# Patient Record
Sex: Female | Born: 1950 | Race: White | Hispanic: No | Smoking: Never smoker
Health system: Southern US, Community
[De-identification: ages and names within clinical notes are randomized; demographics above are authoritative.]

## PROBLEM LIST (undated history)

## (undated) DIAGNOSIS — Z23 Encounter for immunization: Secondary | ICD-10-CM

## (undated) DIAGNOSIS — M1990 Unspecified osteoarthritis, unspecified site: Secondary | ICD-10-CM

## (undated) DIAGNOSIS — R947 Abnormal results of other endocrine function studies: Secondary | ICD-10-CM

## (undated) DIAGNOSIS — Z789 Other specified health status: Secondary | ICD-10-CM

## (undated) DIAGNOSIS — Z973 Presence of spectacles and contact lenses: Secondary | ICD-10-CM

## (undated) DIAGNOSIS — Z1159 Encounter for screening for other viral diseases: Secondary | ICD-10-CM

## (undated) DIAGNOSIS — R3121 Asymptomatic microscopic hematuria: Secondary | ICD-10-CM

## (undated) DIAGNOSIS — E039 Hypothyroidism, unspecified: Secondary | ICD-10-CM

## (undated) DIAGNOSIS — K5909 Other constipation: Secondary | ICD-10-CM

## (undated) DIAGNOSIS — Z9889 Other specified postprocedural states: Secondary | ICD-10-CM

## (undated) DIAGNOSIS — D0511 Intraductal carcinoma in situ of right breast: Secondary | ICD-10-CM

## (undated) DIAGNOSIS — N814 Uterovaginal prolapse, unspecified: Secondary | ICD-10-CM

## (undated) DIAGNOSIS — I10 Essential (primary) hypertension: Secondary | ICD-10-CM

## (undated) HISTORY — DX: Essential (primary) hypertension: I10

## (undated) HISTORY — DX: Intraductal carcinoma in situ of right breast: D05.11

## (undated) HISTORY — PX: MOUTH SURGERY: SHX715

## (undated) HISTORY — PX: EYE SURGERY: SHX253

## (undated) HISTORY — PX: BREAST BIOPSY: SHX20

---

## 1998-10-01 ENCOUNTER — Ambulatory Visit (HOSPITAL_COMMUNITY): Admission: RE | Admit: 1998-10-01 | Discharge: 1998-10-01 | Payer: Self-pay | Admitting: Gastroenterology

## 2000-12-18 HISTORY — PX: LUMBAR LAMINECTOMY: SHX95

## 2001-07-26 ENCOUNTER — Encounter: Admission: RE | Admit: 2001-07-26 | Discharge: 2001-07-26 | Payer: Self-pay | Admitting: Family Medicine

## 2001-08-07 ENCOUNTER — Encounter: Admission: RE | Admit: 2001-08-07 | Discharge: 2001-08-07 | Payer: Self-pay | Admitting: Family Medicine

## 2001-08-28 ENCOUNTER — Ambulatory Visit (HOSPITAL_COMMUNITY): Admission: RE | Admit: 2001-08-28 | Discharge: 2001-08-29 | Payer: Self-pay | Admitting: Neurosurgery

## 2001-09-27 ENCOUNTER — Ambulatory Visit (HOSPITAL_COMMUNITY): Admission: RE | Admit: 2001-09-27 | Discharge: 2001-09-27 | Payer: Self-pay | Admitting: Neurosurgery

## 2005-05-31 ENCOUNTER — Encounter: Admission: RE | Admit: 2005-05-31 | Discharge: 2005-05-31 | Payer: Self-pay | Admitting: General Surgery

## 2005-06-01 ENCOUNTER — Encounter (INDEPENDENT_AMBULATORY_CARE_PROVIDER_SITE_OTHER): Payer: Self-pay | Admitting: Specialist

## 2005-06-01 ENCOUNTER — Encounter: Admission: RE | Admit: 2005-06-01 | Discharge: 2005-06-01 | Payer: Self-pay | Admitting: General Surgery

## 2005-12-01 ENCOUNTER — Encounter: Admission: RE | Admit: 2005-12-01 | Discharge: 2005-12-01 | Payer: Self-pay | Admitting: Internal Medicine

## 2006-04-11 ENCOUNTER — Other Ambulatory Visit: Admission: RE | Admit: 2006-04-11 | Discharge: 2006-04-11 | Payer: Self-pay | Admitting: Internal Medicine

## 2006-06-01 ENCOUNTER — Encounter: Admission: RE | Admit: 2006-06-01 | Discharge: 2006-06-01 | Payer: Self-pay | Admitting: Internal Medicine

## 2007-06-03 ENCOUNTER — Encounter: Admission: RE | Admit: 2007-06-03 | Discharge: 2007-06-03 | Payer: Self-pay | Admitting: Internal Medicine

## 2007-12-28 ENCOUNTER — Emergency Department (HOSPITAL_COMMUNITY): Admission: EM | Admit: 2007-12-28 | Discharge: 2007-12-28 | Payer: Self-pay | Admitting: Family Medicine

## 2008-06-09 ENCOUNTER — Encounter: Admission: RE | Admit: 2008-06-09 | Discharge: 2008-06-09 | Payer: Self-pay | Admitting: Internal Medicine

## 2009-04-15 ENCOUNTER — Other Ambulatory Visit: Admission: RE | Admit: 2009-04-15 | Discharge: 2009-04-15 | Payer: Self-pay | Admitting: Internal Medicine

## 2009-05-07 ENCOUNTER — Encounter: Admission: RE | Admit: 2009-05-07 | Discharge: 2009-05-07 | Payer: Self-pay | Admitting: Internal Medicine

## 2009-06-10 ENCOUNTER — Encounter: Admission: RE | Admit: 2009-06-10 | Discharge: 2009-06-10 | Payer: Self-pay | Admitting: Internal Medicine

## 2009-12-18 DIAGNOSIS — Z23 Encounter for immunization: Secondary | ICD-10-CM

## 2009-12-18 HISTORY — DX: Encounter for immunization: Z23

## 2010-06-13 ENCOUNTER — Encounter: Admission: RE | Admit: 2010-06-13 | Discharge: 2010-06-13 | Payer: Self-pay | Admitting: Adult Health

## 2010-06-14 ENCOUNTER — Encounter: Admission: RE | Admit: 2010-06-14 | Discharge: 2010-06-14 | Payer: Self-pay | Admitting: Internal Medicine

## 2010-12-18 DIAGNOSIS — Z9889 Other specified postprocedural states: Secondary | ICD-10-CM

## 2010-12-18 HISTORY — DX: Other specified postprocedural states: Z98.890

## 2011-05-05 NOTE — Op Note (Signed)
[place]  Patient:    LAYLONIE, MARZEC Visit Number: [id number] MRN: [id number]          Service Type: DSU Location: 3000 3039 01 Attending Physician:  Cristi Loron Dictated by:   Cristi Loron, M.D. Proc. Date: 08/28/01 Admit Date:  08/28/2001                             Operative Report  PREOPERATIVE DIAGNOSIS:  Right L4 herniated nucleus pulposis, spinal stenosis, lumbar radiculopathy of the lumbago.  POSTOPERATIVE DIAGNOSIS: Right L4 herniated nucleus pulposis, spinal stenosis, lumbar radiculopathy of the lumbago.  OPERATION:  Right L4-5 microdiskectomy using microdissection.  SURGEON:  Cristi Loron, M.D.  ASSISTANT:  Hewitt Shorts, M.D.  ANESTHESIA:  General endotracheal.  ESTIMATED BLOOD LOSS:  250 cc.  SPECIMENS:  None.  DRAINS:  None.  COMPLICATIONS:  None.  INDICATIONS:  The patient is a 60 year old white female who has suffered from back and right leg pain.  She has failed medical management.  She was worked up as an outpatient with a lumbar MRI which demonstrated a herniated disk at L4 on the right.  The patients signs and symptoms of physical exam are consistent with a right L4-5 radiculopathy.  The patient therefore weighed the risks, benefits and alternatives of surgery and decided to proceed with a microdiskectomy.  DESCRIPTION OF PROCEDURE:  The patient was brought to the operating room by the anesthesia team. General endotracheal anesthesia was induced.  The patient was turned to the prone position on the Wilson frame.  Her lumbosacral region was then prepared with Betadine scrub and Betadine solution.  Sterile drapes were applied.  I then injected the area to be incised with Marcaine with epinephrine solution.  I used a scalpel to make a midline incision over the L3-L4 and L4-L5 interspaces.  I used electrocautery to dissect down to the thoracolumbar fascia and divided the fascia just to  the right of midline performing a right sided subperiosteal dissection stripping the paraspinous musculature from the right spinous process and lamina of L3, L4 and L5.  I inserted the McCullough retractor for exposure, and then I obtained intraoperative radiographs to confirm my location.  I then brought the microscope into the field and under its magnification and illumination I completed the microdissection/decompression.  I used the high speed drill to perform a right L4 and L3 laminotomy.  I removed the right L4-L5 ligament of flavum and I completed a right L4 hemilaminectomy using Kerrison punch.  I then the ligament of L3-L4 and caudal aspect of the right L3 lamina.  I removed some ligament from the lateral recess, and this gave good exposure of the thecal sac as well as the right L4-L5 nerve root.  I used microdissection to free up the thecal sac and the nerve root from the epidural tissue and then used the DErrico retractor to carefully tract the thecal sac medially. The patient had a large free fragment disk herniation which was just ventral to the right L4 nerve root as it was exiting around the right L4 pedicle out the neural foramen.  I used microdissection to free up the herniated disk and removed it in multiple fragments using pituitary forceps.  I resounded the neural foramen with the nerve hook and coronary dilator and got some more fragments out the of neural foramen.  In the process of doing this, I encountered some vigorous epidural  venous bleeding which was controlled with electrocautery and Gelfoam.  I then palpated along the ventral surface of the thecal sac along the exit route of the L4 nerve root all the way out in the neural foramen.  The L4 nerve root was well decompressed.  I also palpated around the L5 nerve root, and it too was well decompressed.  I inspected the right L3-4 and L4-5 intervertebral disks.  There were no large holes in the annulus.  I then was  satisfied with the decompression.  I achieved stringent hemostasis using bipolar electrocautery and Gelfoam.  I then copiously irrigated the wound out with Bacitracin solution and removed the solution and then removed the McCall retractor and reapproximated the patients thoracolumbar fascia with interrupted #1 Vicryl suture, the subcutaneous tissue with interrupted 2-0 Vicryl suture, and the skin with Steri-Strips and Benzoin.  The wound was then coated with Bacitracin ointment.  A sterile dressing was applied, and the drapes were removed.  The patient was subsequently returned to the supine position where she was extubated by the anesthesia team and transported to the post anesthesia care unit in stable condition.  All sponge, needle and instrument counts were correct at the end of this case. Dictated by:   Cristi Loron, M.D. Attending Physician:  Tressie Stalker D DD:  08/28/01 TD:  08/28/01 Job: [id number]

## 2011-05-05 NOTE — H&P (Signed)
[place]. [facility]  Patient:    Victoria Wallace, Victoria Wallace Visit Number: [id number] MRN: [id number]          Service Type: DSU Location: [facility] 3172 01 Attending Physician:  Cristi Loron Admit Date:  08/28/2001                           History and Physical  CHIEF COMPLAINT:  Right hip, buttock, and leg pain.  HISTORY OF PRESENT ILLNESS:  The patient is a 60 year old white female, who began having right hip and leg pain approximately two months ago.  She was worked up with a lumbar MRI which demonstrated a herniated disk at L4-5 on the right.  The patients signs and symptoms and physical exam were consistent with a right L4 radiculopathy.  She therefore weighed the risks, benefits, and alternatives to surgery and decided to proceed with a microdiskectomy.  PAST MEDICAL HISTORY:  Positive for hypertension.  PAST SURGICAL HISTORY:  Oral surgery.  MEDICATIONS: 1. Hydrochlorothiazide 25 mg p.o. q.d. 2. Toprol XL 50 mg p.o. q.d. 3. Avapro 150 mg p.o. q.d. 4. Nasonex nasal spray q.d. 5. Ibuprofen 800 mg t.i.d. 6. Vicodin p.r.n.  DRUG ALLERGIES:  PENICILLIN, CODEINE cause rash.  FAMILY HISTORY:  The patients mother is age 16 in excellent health.  The patients father died at age 40 of an abdominal aortic aneurysm.  SOCIAL HISTORY:  The patient is married.  She has no children.  She lives in [place].  She is employed as an Tourist information centre manager.  She denies tobacco, ethanol, drug use.  REVIEW OF SYSTEMS:  Negative except as above.  PHYSICAL EXAMINATION:  GENERAL:  A pleasant, obese, 60 year old white female, who walks with an antalgic gait, complaining of right hip and leg pain.  Height 5 foot 20 inches.  Weight 225 pounds.  HEENT:  Normal.  NECK:  Normal.  THORAX:  Symmetric.  LUNGS:  Clear to auscultation.  HEART:  Regular rate and rhythm.  ABDOMEN:  Soft, nontender, obese.  EXTREMITIES:  Obese.  No obvious deformities.  BACK:  No  point tenderness, deformities.  Straight leg raise testing is positive on the right and negative on the left.  Faberes testing is negative bilaterally.  NEUROLOGIC:  The patient is alert and oriented x 3.  Cranial nerves 2-12 are grossly intact.  Vision and hearing grossly normal bilaterally.  Motor strength is 5/5 in her bilateral deltoid, biceps, triceps, hand grip, wrist extensor, interosseous right psoas, quadriceps, gastrocnemius, and extensor hallucis longus.  Cerebellar exam is intact to rapid alternating movements of the upper extremities bilaterally.  Sensory exam is normal to light touch and pinprick sensation and all tests of dermatomes bilaterally.  Deep tendon reflexes are 2/4 in her bilateral biceps, triceps, triceps, brachioradialis, gastrocnemius, and left quadriceps.  1/4 in her right quadriceps.  IMAGING STUDIES:  The patient had a lumbar performed with and without contrast at [facility] on August 07, 2001, which demonstrated a large herniated disk at L4 on the right with compression of the right L4 nerve root.  Less likely is a nerve tumor.  ASSESSMENT AND PLAN:  Herniated nucleus pulposus at L4-5 on the right, right L4 radiculopathy, spinal stenosis.  I have discussed the situation with the patient, reviewed the magnetic resonance imaging scan with her, pointed out the abnormalities.  I have discussed the various treatment options with her including doing nothing, continued medical management, surgery.  I have described the  procedure of a right L4 microdiskectomy.  I have discussed the risks of surgery extensively.  The patient has weighted the risks, benefits, and alternatives of surgery and wishes to proceed with the operation on August 28, 2001. Attending Physician:  Tressie Stalker D DD:  08/28/01 TD:  08/28/01 Job: [id number]

## 2011-05-19 ENCOUNTER — Other Ambulatory Visit: Payer: Self-pay | Admitting: Internal Medicine

## 2011-05-19 DIAGNOSIS — Z1231 Encounter for screening mammogram for malignant neoplasm of breast: Secondary | ICD-10-CM

## 2011-06-19 ENCOUNTER — Ambulatory Visit
Admission: RE | Admit: 2011-06-19 | Discharge: 2011-06-19 | Disposition: A | Discharge status: Home / Self Care | Payer: BC Managed Care – PPO | Source: Ambulatory Visit | Attending: Internal Medicine | Admitting: Internal Medicine

## 2011-06-19 DIAGNOSIS — Z1231 Encounter for screening mammogram for malignant neoplasm of breast: Secondary | ICD-10-CM

## 2011-09-06 LAB — POCT URINALYSIS DIP (DEVICE)
Glucose, UA: NEGATIVE
Ketones, ur: NEGATIVE
Operator id: 247071
Protein, ur: 100 — AB
Specific Gravity, Urine: 1.01

## 2012-05-15 ENCOUNTER — Other Ambulatory Visit: Payer: Self-pay | Admitting: Internal Medicine

## 2012-05-15 DIAGNOSIS — Z1231 Encounter for screening mammogram for malignant neoplasm of breast: Secondary | ICD-10-CM

## 2012-06-24 ENCOUNTER — Ambulatory Visit
Admission: RE | Admit: 2012-06-24 | Discharge: 2012-06-24 | Disposition: A | Discharge status: Home / Self Care | Payer: BC Managed Care – PPO | Source: Ambulatory Visit | Attending: Internal Medicine | Admitting: Internal Medicine

## 2012-06-24 DIAGNOSIS — Z1231 Encounter for screening mammogram for malignant neoplasm of breast: Secondary | ICD-10-CM

## 2013-05-28 ENCOUNTER — Other Ambulatory Visit: Payer: Self-pay

## 2013-05-28 DIAGNOSIS — Z1231 Encounter for screening mammogram for malignant neoplasm of breast: Secondary | ICD-10-CM

## 2013-07-01 ENCOUNTER — Ambulatory Visit
Admission: RE | Admit: 2013-07-01 | Discharge: 2013-07-01 | Disposition: A | Discharge status: Home / Self Care | Payer: BC Managed Care – PPO | Source: Ambulatory Visit

## 2013-07-01 DIAGNOSIS — Z1231 Encounter for screening mammogram for malignant neoplasm of breast: Secondary | ICD-10-CM

## 2014-05-27 ENCOUNTER — Other Ambulatory Visit: Payer: Self-pay | Admitting: Internal Medicine

## 2014-05-27 ENCOUNTER — Other Ambulatory Visit: Payer: Self-pay

## 2014-05-27 DIAGNOSIS — Z1231 Encounter for screening mammogram for malignant neoplasm of breast: Secondary | ICD-10-CM

## 2014-05-27 DIAGNOSIS — Z780 Asymptomatic menopausal state: Secondary | ICD-10-CM

## 2014-06-17 DIAGNOSIS — R947 Abnormal results of other endocrine function studies: Secondary | ICD-10-CM

## 2014-06-17 HISTORY — DX: Abnormal results of other endocrine function studies: R94.7

## 2014-07-02 ENCOUNTER — Other Ambulatory Visit: Payer: Self-pay | Admitting: Internal Medicine

## 2014-07-02 ENCOUNTER — Ambulatory Visit
Admission: RE | Admit: 2014-07-02 | Discharge: 2014-07-02 | Disposition: A | Discharge status: Home / Self Care | Payer: BC Managed Care – PPO | Source: Ambulatory Visit

## 2014-07-02 ENCOUNTER — Ambulatory Visit
Admission: RE | Admit: 2014-07-02 | Discharge: 2014-07-02 | Disposition: A | Discharge status: Home / Self Care | Payer: BC Managed Care – PPO | Source: Ambulatory Visit | Attending: Internal Medicine | Admitting: Internal Medicine

## 2014-07-02 DIAGNOSIS — Z780 Asymptomatic menopausal state: Secondary | ICD-10-CM

## 2014-07-02 DIAGNOSIS — E2839 Other primary ovarian failure: Secondary | ICD-10-CM

## 2014-07-02 DIAGNOSIS — Z1231 Encounter for screening mammogram for malignant neoplasm of breast: Secondary | ICD-10-CM

## 2014-10-27 ENCOUNTER — Other Ambulatory Visit: Payer: Self-pay | Admitting: Internal Medicine

## 2014-10-27 DIAGNOSIS — M545 Low back pain, unspecified: Secondary | ICD-10-CM

## 2014-10-27 DIAGNOSIS — R29898 Other symptoms and signs involving the musculoskeletal system: Secondary | ICD-10-CM

## 2014-11-04 ENCOUNTER — Ambulatory Visit
Admission: RE | Admit: 2014-11-04 | Discharge: 2014-11-04 | Disposition: A | Discharge status: Home / Self Care | Payer: BC Managed Care – PPO | Source: Ambulatory Visit | Attending: Internal Medicine | Admitting: Internal Medicine

## 2014-11-04 DIAGNOSIS — M545 Low back pain, unspecified: Secondary | ICD-10-CM

## 2014-11-04 DIAGNOSIS — R29898 Other symptoms and signs involving the musculoskeletal system: Secondary | ICD-10-CM

## 2015-03-08 ENCOUNTER — Ambulatory Visit: Payer: Medicare Other | Admitting: Physical Therapy

## 2015-03-15 ENCOUNTER — Encounter: Payer: Self-pay | Admitting: Physical Therapy

## 2015-03-15 ENCOUNTER — Ambulatory Visit: Payer: Medicare Other | Attending: Internal Medicine | Admitting: Physical Therapy

## 2015-03-15 DIAGNOSIS — M25561 Pain in right knee: Secondary | ICD-10-CM | POA: Insufficient documentation

## 2015-03-15 DIAGNOSIS — M25562 Pain in left knee: Secondary | ICD-10-CM | POA: Diagnosis not present

## 2015-03-15 DIAGNOSIS — R293 Abnormal posture: Secondary | ICD-10-CM | POA: Diagnosis not present

## 2015-03-15 DIAGNOSIS — M25551 Pain in right hip: Secondary | ICD-10-CM | POA: Diagnosis not present

## 2015-03-15 DIAGNOSIS — M25552 Pain in left hip: Secondary | ICD-10-CM | POA: Diagnosis not present

## 2015-03-15 DIAGNOSIS — R531 Weakness: Secondary | ICD-10-CM

## 2015-03-15 NOTE — Patient Instructions (Addendum)
   Posture Tips DO: - stand tall and erect - keep chin tucked in - keep head and shoulders in alignment - check posture regularly in mirror or large window - pull head back against headrest in car seat;  Change your position often.  Sit with lumbar support. DON'T: - slouch or slump while watching TV or reading - sit, stand or lie in one position  for too long;  Sitting is especially hard on the spine so if you sit at a desk/use the computer, then stand up often!   Copyright  VHI. All rights reserved.  Posture - Standing   Good posture is important. Avoid slouching and forward head thrust. Maintain curve in low back and align ears over shoul- ders, hips over ankles.  Pull your belly button in toward your back bone.   Copyright  VHI. All rights reserved.  Posture - Sitting   Sit upright, head facing forward. Try using a roll to support lower back. Keep shoulders relaxed, and avoid rounded back. Keep hips level with knees. Avoid crossing legs for long periods.   Copyright  VHI. All rights reserved.    Starr Lake PT, DPT, LAT, ATC  [facility] Phone: [phone]

## 2015-03-15 NOTE — Therapy (Signed)
[place], [place], [address] Phone: [phone]   Fax:  [phone]  Physical Therapy Evaluation  Patient Details  Name: Victoria Wallace MRN: [id number] Date of Birth: Nov 24, 1951 Referring Provider:  Levin Erp, MD  Encounter Date: 03/15/2015      PT End of Session - 03/15/15 1519    Visit Number 1   Number of Visits 12   Date for PT Re-Evaluation 04/26/15   PT Start Time 3016   PT Stop Time 1500   PT Time Calculation (min) 45 min   Activity Tolerance Patient tolerated treatment well   Behavior During Therapy [facility] for tasks assessed/performed      Past Medical History  Diagnosis Date  . Hypertension     Past Surgical History  Procedure Laterality Date  . Lumbar laminectomy Bilateral 2002    There were no vitals filed for this visit.  Visit Diagnosis:  Bilateral hip pain - Plan: PT plan of care cert/re-cert  Bilateral knee pain - Plan: PT plan of care cert/re-cert  General weakness - Plan: PT plan of care cert/re-cert  Abnormal posture - Plan: PT plan of care cert/re-cert      Subjective Assessment - 03/15/15 1433    Symptoms pt is a 64 y.o. with CC of bil hip and knee pain with R> L. She states its been going on for about 2 years and fluctuates since onset.     Limitations Lifting;Walking;Standing   How long can you sit comfortably? 1 hour   How long can you stand comfortably? 30 min   How long can you walk comfortably? 1 hour   Diagnostic tests Lumbar MRI per pt report degenerative findings   Patient Stated Goals to stop hurting, to get life back, to be able to walk    Currently in Pain? Yes   Pain Score 2   last week 7/10   Pain Location Hip   Pain Orientation Left;Right   Pain Descriptors / Indicators Aching;Dull   Pain Type Chronic pain   Pain Onset More than a month ago   Pain Frequency Intermittent   Aggravating Factors  varies on how inflammed it is, climbing stairs,  walking   Pain Relieving Factors ice of the knee   Effect of Pain on Daily Activities depends when joints are inflammed.    Multiple Pain Sites Yes   Pain Score 2  last week 7/10   Pain Location Knee   Pain Orientation Right;Left   Pain Descriptors / Indicators Aching;Dull;Sore   Pain Type Chronic pain   Pain Onset More than a month ago   Pain Frequency Intermittent   Aggravating Factors  climbing stairs, walking after sitting for >30 min   Pain Relieving Factors sitting   Effect of Pain on Daily Activities limited standing/ walking            [facility] PT Assessment - 03/15/15 1444    Assessment   Medical Diagnosis Bil hip and knee pain   Onset Date --  2 years ago   Next MD Visit 04/15/2015   Prior Therapy R    Precautions   Precautions None   Restrictions   Weight Bearing Restrictions No   Balance Screen   Has the patient fallen in the past 6 months No   [place] Private residence   Living Arrangements Spouse/significant other   Available Help at Discharge Available 24 hours/day   Type of Home House  Home Access Stairs to enter   Entrance Stairs-Number of Steps 5   Entrance Stairs-Rails Left   Home Layout Two level   Alternate Level Stairs-Number of Steps 14   Alternate Level Stairs-Rails Left   Prior Function   Level of Independence Independent with basic ADLs;Independent with homemaking with ambulation;Independent with gait;Independent with transfers   Vocation Retired   [employer], walking, being outdoors, reading, music   Cognition   Overall Cognitive Status Within Functional Limits for tasks assessed   Observation/Other Assessments   Focus on Therapeutic Outcomes (FOTO)  49% limitation   (projected to change to 39%)   Posture/Postural Control   Posture/Postural Control Postural limitations   Postural Limitations Rounded Shoulders;Forward head;Posterior pelvic tilt;Decreased lumbar lordosis   ROM / Strength   AROM / PROM  / Strength AROM;Strength   Strength   Strength Assessment Site Hip;Knee   Right Hip Flexion 4-/5   Right Hip Extension 4+/5   Right Hip External Rotation  --   Right Hip Internal Rotation  --   Right Hip ABduction 4+/5   Right Hip ADduction 4+/5   Left Hip Flexion 4-/5   Left Hip Extension 4+/5   Left Hip External Rotation  --   Left Hip Internal Rotation  --   Left Hip ABduction 3+/5   Left Hip ADduction 4+/5   Right Knee Flexion 4+/5   Right Knee Extension 4+/5   Left Knee Flexion 4-/5   Left Knee Extension 3+/5   Palpation   Palpation tenderness located at bil greater trochanter and at the piriformis.   Special Tests    Special Tests Hip Special Tests   Hip Special Tests  Saralyn Pilar (FABER) Test;SI Distraction;SI Compression;Hip Scouring;Thomas Test   [place] (FABER) Test   Findings Negative   Marcello Moores Test    Findings Positive   Side --  Bil   SI Compression   Findings Negative   SI Distraction   Findings  Negative   Hip Scouring   Findings Negative                           PT Education - 03/15/15 1519    Education provided Yes   Education Details Evaluation findings, POC, Goals, HEP   Person(s) Educated Patient   Methods Explanation   Comprehension Verbalized understanding          PT Short Term Goals - 03/15/15 1533    PT SHORT TERM GOAL #1   Title pt will be I with basic HEP (04/05/2015)   Time 3   Period Weeks   Status New           PT Long Term Goals - 03/15/15 1535    PT LONG TERM GOAL #1   Title pt will decrease pain to <3/10 following walking/ standing for >30 minutes to assist with ADL's (04/26/2015)   Time 6   Period Weeks   Status New   PT LONG TERM GOAL #2   Title pt will increase FOTO score by > 10 points to help functional capacity (04/26/2015)   Time 6   Period Weeks   Status New   PT LONG TERM GOAL #3   Title pt will increase Bil lower extremity strenght to > 4/5 to assist with prolonged walking and lifting for  exercise(04/26/2015)   Time 6   Period Weeks   Status New   PT LONG TERM GOAL #4   Title pt will be able  to verbalize and demonstrate techniques to reduce risk of bil hip/ knee reinjury by postural awareness, lifting and carrying mechanics, and HEP. (04/26/2015)   Time 6   Period Weeks   Status New               Plan - 04/07/2015 1520    Clinical Impression Statement Giara presents to OPPT with CC of Bil hip and knee pain with R>L.  bil hip and knee AROM are WNL without any pain at end range.  She has weakness at the L knee and L hip abductors and Bil hip flexors.  pt is demonstrates tenderness at bil greater trochanter and piriformis with R>L, she denies any referral of symptoms down the leg. she exhibits posterior pelvic tilt, with decreased lumbar lordosis, forward head posture and ant rolled shoulders. She would benefit from skilled physical therapy to maximize her functional capcacity and decreased pain by address the impairments listed.    Pt will benefit from skilled therapeutic intervention in order to improve on the following deficits Pain;Decreased strength;Postural dysfunction;Decreased endurance;Decreased activity tolerance;Increased muscle spasms;Increased fascial restricitons   Rehab Potential Good   PT Frequency 2x / week   PT Duration 6 weeks   PT Treatment/Interventions ADLs/Self Care Home Management;Moist Heat;Therapeutic activities;Patient/family education;Therapeutic exercise;Ultrasound;Manual techniques;Dry needling;Electrical Stimulation;Cryotherapy;Neuromuscular re-education   PT Next Visit Plan assess response to HEP, Stretching and strengthening of knee musculature, STM for glute/piriformis tightness/pain, modalities for pain PRN.    PT Home Exercise Plan hamstring stretch, hipflexor stretch, SKC stretch, piriformis stretch, pelvic tilt, posutre awareness   Consulted and Agree with Plan of Care Patient          G-Codes - 07-Apr-2015 1737    Functional Assessment  Tool Used FOTO   Functional Limitation Mobility: Walking and moving around   Mobility: Walking and Moving Around Current Status [phone]) At least 40 percent but less than 60 percent impaired, limited or restricted   Mobility: Walking and Moving Around Goal Status [phone]) At least 20 percent but less than 40 percent impaired, limited or restricted       Problem List There are no active problems to display for this patient.  Starr Lake PT, DPT, LAT, ATC  07-Apr-2015  5:40 PM   [facility] [address] [place], [place], [address] Phone: [phone]   Fax:  [phone]

## 2015-03-18 ENCOUNTER — Ambulatory Visit: Payer: Medicare Other | Admitting: Physical Therapy

## 2015-03-18 DIAGNOSIS — M25551 Pain in right hip: Secondary | ICD-10-CM | POA: Diagnosis not present

## 2015-03-18 DIAGNOSIS — R293 Abnormal posture: Secondary | ICD-10-CM

## 2015-03-18 DIAGNOSIS — R531 Weakness: Secondary | ICD-10-CM

## 2015-03-18 NOTE — Therapy (Signed)
[place], [place], [address] Phone: [phone]   Fax:  [phone]  Physical Therapy Treatment  Patient Details  Name: Victoria Wallace MRN: [id number] Date of Birth: 1951-05-03 Referring Provider:  Levin Erp, MD  Encounter Date: 03/18/2015      PT End of Session - 03/18/15 1138    Visit Number 2   Number of Visits 12   Date for PT Re-Evaluation 04/26/15   PT Start Time 1157   PT Stop Time 1105   PT Time Calculation (min) 50 min   Activity Tolerance Patient tolerated treatment well      Past Medical History  Diagnosis Date  . Hypertension     Past Surgical History  Procedure Laterality Date  . Lumbar laminectomy Bilateral 2002    There were no vitals filed for this visit.  Visit Diagnosis:  General weakness  Abnormal posture      Subjective Assessment - 03/18/15 1018    Symptoms Low back Lt was side of back pain.  pain cycles.  aching discomfort.    Currently in Pain? Yes   Pain Location Hip   Pain Orientation Left;Right  Rt knee old injurt, Rt piriformis area   Pain Descriptors / Indicators Aching;Dull   Aggravating Factors  not sure   Pain Relieving Factors stretching   Effect of Pain on Daily Activities tenses for activities   Multiple Pain Sites Yes                       [facility] Adult PT Treatment/Exercise - 03/18/15 1030    Lumbar Exercises: Stretches   Pelvic Tilt 5 reps  requested clarification of technique   Knee/Hip Exercises: Stretches   Active Hamstring Stretch 3 reps;30 seconds  cues added ankle pump for nerve stretch   Quad Stretch --  1 rep standing to similate what is done in pool. Neutral spi   Hip Flexor Stretch 3 reps  longer stretches encoureaged   ITB Stretch 1 rep  2 positions tried Rt not tight   Piriformis Stretch --  supine and sitting   Knee/Hip Exercises: Seated   Long Arc Quad --  with ankle pump Unable to do with neutral spine    Other Seated Knee Exercises Dural glide added to home, sheet used cautioned about irritation10 reps, added to home   Knee/Hip Exercises: Supine   Quad Sets 10 reps  reported feeling tug in Piriformis, Less opposite knee flex   Other Supine Knee Exercises groin/hipER stretch with feet together both 3 reps 20 seconds hold                PT Education - 03/18/15 1137    Education provided Yes   Education Details Dural glide, mild tugs only   Person(s) Educated Patient   Methods Explanation;Demonstration;Handout   Comprehension Verbalized understanding          PT Short Term Goals - 03/18/15 1140    PT SHORT TERM GOAL #1   Title pt will be I with basic HEP (04/05/2015)   Time 3   Period Weeks   Status On-going           PT Long Term Goals - 03/15/15 1535    PT LONG TERM GOAL #1   Title pt will decrease pain to <3/10 following walking/ standing for >30 minutes to assist with ADL's (04/26/2015)   Time 6   Period Weeks   Status New  PT LONG TERM GOAL #2   Title pt will increase FOTO score by > 10 points to help functional capacity (04/26/2015)   Time 6   Period Weeks   Status New   PT LONG TERM GOAL #3   Title pt will increase Bil lower extremity strenght to > 4/5 to assist with prolonged walking and lifting for exercise(04/26/2015)   Time 6   Period Weeks   Status New   PT LONG TERM GOAL #4   Title pt will be able to verbalize and demonstrate techniques to reduce risk of bil hip/ knee reinjury by postural awareness, lifting and carrying mechanics, and HEP. (04/26/2015)   Time 6   Period Weeks   Status New               Plan - 03/18/15 1139    Clinical Impression Statement Patient very informative about her symptoms .  stretching and strengthening focus today,  Able to make progress toward her home exercise goal.   PT Next Visit Plan assess response to HEP, Stretching and strengthening of knee musculature, STM for glute/piriformis tightness/pain, modalities  for pain PRN.         Problem List There are no active problems to display for this patient.  Melvenia Needles, PTA 03/18/2015 11:41 AM Phone: [phone] Fax: [phone]   [facility] 03/18/2015, 11:41 AM  [facility] [address] [place], [place], [address] Phone: [phone]   Fax:  [phone]

## 2015-03-24 ENCOUNTER — Ambulatory Visit: Payer: Medicare Other | Attending: Internal Medicine | Admitting: Physical Therapy

## 2015-03-24 DIAGNOSIS — R531 Weakness: Secondary | ICD-10-CM | POA: Diagnosis not present

## 2015-03-24 DIAGNOSIS — M25552 Pain in left hip: Secondary | ICD-10-CM | POA: Insufficient documentation

## 2015-03-24 DIAGNOSIS — M25562 Pain in left knee: Secondary | ICD-10-CM | POA: Diagnosis not present

## 2015-03-24 DIAGNOSIS — M25561 Pain in right knee: Secondary | ICD-10-CM | POA: Diagnosis not present

## 2015-03-24 DIAGNOSIS — R293 Abnormal posture: Secondary | ICD-10-CM | POA: Diagnosis not present

## 2015-03-24 DIAGNOSIS — M25551 Pain in right hip: Secondary | ICD-10-CM | POA: Insufficient documentation

## 2015-03-24 NOTE — Therapy (Signed)
[place], [place], [address] Phone: [phone]   Fax:  [phone]  Physical Therapy Treatment  Patient Details  Name: DESTIN KITTLER MRN: [id number] Date of Birth: 1951-06-24 Referring Provider:  Levin Erp, MD  Encounter Date: 03/24/2015      PT End of Session - 03/24/15 1634    Visit Number 3   Number of Visits 12   Date for PT Re-Evaluation 04/26/15   PT Start Time 6294   PT Stop Time 1630   PT Time Calculation (min) 45 min   Activity Tolerance Patient tolerated treatment well   Behavior During Therapy [facility] for tasks assessed/performed      Past Medical History  Diagnosis Date  . Hypertension     Past Surgical History  Procedure Laterality Date  . Lumbar laminectomy Bilateral 2002    There were no vitals filed for this visit.  Visit Diagnosis:  General weakness  Abnormal posture  Bilateral hip pain  Bilateral knee pain      Subjective Assessment - 03/24/15 1552    Subjective she states she has been doing her HEP, and has some minimal discomfort in the R posterior hip.   Currently in Pain? Yes   Pain Score 2    Pain Location Hip   Pain Orientation Right;Left   Pain Descriptors / Indicators Aching;Dull   Pain Type Chronic pain   Pain Onset More than a month ago   Pain Frequency Intermittent                       [facility] Adult PT Treatment/Exercise - 03/24/15 1556    Lumbar Exercises: Aerobic   Stationary Bike L3 x 8 min   Knee/Hip Exercises: Stretches   Active Hamstring Stretch 3 reps;30 seconds  with other knee bent for back support   Hip Flexor Stretch 1 rep;30 seconds  with other knee bent for back support   Piriformis Stretch 2 reps;30 seconds   Knee/Hip Exercises: Seated   Other Seated Knee Exercises neural flossing, with cervical flexion/knee flexion, cervical extension/ knee extension   Knee/Hip Exercises: Supine   Bridges  AROM;Strengthening;Both;1 set;10 reps  3 sec hold   Manual Therapy   Manual Therapy Massage   Massage tack and stretch of the piriformis, DTM/trigger point release of the piriformis using tennis ball                PT Education - 03/24/15 1633    Education provided Yes   Education Details review HEP, DTM of piriformis using tennis ball. Explained to pt that less is more and not to do too much exercises at home.    Person(s) Educated Patient   Methods Explanation   Comprehension Verbalized understanding          PT Short Term Goals - 03/18/15 1140    PT SHORT TERM GOAL #1   Title pt will be I with basic HEP (04/05/2015)   Time 3   Period Weeks   Status On-going           PT Long Term Goals - 03/15/15 1535    PT LONG TERM GOAL #1   Title pt will decrease pain to <3/10 following walking/ standing for >30 minutes to assist with ADL's (04/26/2015)   Time 6   Period Weeks   Status New   PT LONG TERM GOAL #2   Title pt will increase FOTO score by > 10 points to  help functional capacity (04/26/2015)   Time 6   Period Weeks   Status New   PT LONG TERM GOAL #3   Title pt will increase Bil lower extremity strenght to > 4/5 to assist with prolonged walking and lifting for exercise(04/26/2015)   Time 6   Period Weeks   Status New   PT LONG TERM GOAL #4   Title pt will be able to verbalize and demonstrate techniques to reduce risk of bil hip/ knee reinjury by postural awareness, lifting and carrying mechanics, and HEP. (04/26/2015)   Time 6   Period Weeks   Status New               Plan - 03/24/15 1634    Clinical Impression Statement Zaiya has made progress from the first time that she was seen. Focused todays treatment on stretching and STM of the piriformis/R gluteal area. PT spoke with pt about not doing too much too soon in regard to exercises to avoid chances of reinjury, "less is more". Gave pt tennis ball DTM/trigger point release for HEP.    PT Next Visit  Plan  Stretching and strengthening of knee musculature, STM for glute/piriformis tightness/pain, modalities for pain PRN, Dry needling?   PT Home Exercise Plan DTM of piriformis with tennis ball   Consulted and Agree with Plan of Care Patient        Problem List There are no active problems to display for this patient.  Starr Lake PT, DPT, LAT, ATC  03/24/2015  5:49 PM    [facility] [address] [place], [place], [address] Phone: [phone]   Fax:  [phone]

## 2015-03-29 ENCOUNTER — Ambulatory Visit: Payer: Medicare Other | Admitting: Physical Therapy

## 2015-03-29 DIAGNOSIS — M25561 Pain in right knee: Secondary | ICD-10-CM

## 2015-03-29 DIAGNOSIS — M25551 Pain in right hip: Secondary | ICD-10-CM | POA: Diagnosis not present

## 2015-03-29 DIAGNOSIS — M25552 Pain in left hip: Secondary | ICD-10-CM

## 2015-03-29 DIAGNOSIS — M25562 Pain in left knee: Secondary | ICD-10-CM

## 2015-03-29 DIAGNOSIS — R531 Weakness: Secondary | ICD-10-CM

## 2015-03-29 NOTE — Therapy (Signed)
[place], [place], [address] Phone: [phone]   Fax:  [phone]  Physical Therapy Treatment  Patient Details  Name: Victoria Wallace MRN: [id number] Date of Birth: April 12, 1951 Referring Provider:  Levin Erp, MD  Encounter Date: 03/29/2015      PT End of Session - 03/29/15 1505    Visit Number 4   Number of Visits 12   Date for PT Re-Evaluation 04/26/15   PT Start Time 9417   PT Stop Time 1504   PT Time Calculation (min) 47 min   Activity Tolerance --      Past Medical History  Diagnosis Date  . Hypertension     Past Surgical History  Procedure Laterality Date  . Lumbar laminectomy Bilateral 2002    There were no vitals filed for this visit.  Visit Diagnosis:  General weakness  Bilateral knee pain  Bilateral hip pain      Subjective Assessment - 03/29/15 1432    Subjective Anterior hip stretch off edge of bed increases .  Going up steps step over step with less use of arms.  Less limping. Doing her home exercises.  Stopped taking her prednisone on her own.   Currently in Pain? No/denies   Pain Score --  up to 5/10.   Pain Radiating Towards knees.   Aggravating Factors  going up steps, weather?, stopping meds.   Pain Relieving Factors stretches.   Multiple Pain Sites No                       [facility] Adult PT Treatment/Exercise - 03/29/15 1437    Lumbar Exercises: Stretches   Passive Hamstring Stretch 3 reps;30 seconds   Quad Stretch 3 reps;30 seconds  prone   Piriformis Stretch 3 reps;30 seconds  Lt leg on top reports burning anterior lateral Lt shin   Knee/Hip Exercises: Public affairs consultant 3 reps;30 seconds   Hip Flexor Stretch 3 reps;30 seconds  each, prone   Knee/Hip Exercises: Standing   Lateral Step Up Both;1 set;Hand Hold: 2;Step Height: 4"   Forward Step Up Both;1 set;Hand Hold: 1;Step Height: 6"  Rt hip gives away.   Knee/Hip Exercises:  Machines for Strengthening   Cybex Knee Flexion --  3 plates, 2 legs 13 reps                PT Education - 03/29/15 1505    Education provided Yes   Education Details options for hip stretches, nerve gliding   Person(s) Educated Patient   Methods Explanation;Demonstration;Handout   Comprehension Verbalized understanding;Returned demonstration          PT Short Term Goals - 03/29/15 1508    PT SHORT TERM GOAL #1   Title pt will be I with basic HEP (04/05/2015)   Baseline questions   Time 3   Period Weeks   Status On-going           PT Long Term Goals - 03/29/15 1509    PT LONG TERM GOAL #1   Title pt will decrease pain to <3/10 following walking/ standing for >30 minutes to assist with ADL's (04/26/2015)   Time 6   Period Weeks   Status On-going   PT LONG TERM GOAL #2   Title pt will increase FOTO score by > 10 points to help functional capacity (04/26/2015)   Time 6   Period Weeks   Status Unable to assess   PT  LONG TERM GOAL #3   Title pt will increase Bil lower extremity strenght to > 4/5 to assist with prolonged walking and lifting for exercise(04/26/2015)   Time 6   Period Weeks   Status On-going   PT LONG TERM GOAL #4   Title pt will be able to verbalize and demonstrate techniques to reduce risk of bil hip/ knee reinjury by postural awareness, lifting and carrying mechanics, and HEP. (04/26/2015)   Time 6   Period Weeks   Status On-going               Plan - 03/29/15 1507    Clinical Impression Statement Continues with multiple questions . Answered questions about nerve gliding and exercises, able to begin strengthening to leg. Progress toward mobility goals.        Problem List There are no active problems to display for this patient. Melvenia Needles, PTA 03/29/2015 6:06 PM Phone: [phone] Fax: [phone]   [facility] 03/29/2015, 6:06 PM  [facility] [address] [place], [place], [address] Phone: [phone]   Fax:  [phone]

## 2015-03-31 ENCOUNTER — Ambulatory Visit: Payer: Medicare Other | Admitting: Physical Therapy

## 2015-03-31 DIAGNOSIS — M25551 Pain in right hip: Secondary | ICD-10-CM | POA: Diagnosis not present

## 2015-03-31 DIAGNOSIS — R293 Abnormal posture: Secondary | ICD-10-CM

## 2015-03-31 DIAGNOSIS — M25562 Pain in left knee: Secondary | ICD-10-CM

## 2015-03-31 DIAGNOSIS — M25552 Pain in left hip: Secondary | ICD-10-CM

## 2015-03-31 DIAGNOSIS — M25561 Pain in right knee: Secondary | ICD-10-CM

## 2015-03-31 DIAGNOSIS — R531 Weakness: Secondary | ICD-10-CM

## 2015-03-31 NOTE — Patient Instructions (Signed)
   [name] PT, DPT, LAT, ATC  [facility] Phone: [phone]     

## 2015-03-31 NOTE — Therapy (Signed)
[place], [place], [address] Phone: [phone]   Fax:  [phone]  Physical Therapy Treatment  Patient Details  Name: Victoria Wallace MRN: [id number] Date of Birth: 08/13/1951 Referring Provider:  Levin Erp, MD  Encounter Date: 03/31/2015      PT End of Session - 03/31/15 1154    Visit Number 5   Number of Visits 12   Date for PT Re-Evaluation 04/26/15   PT Start Time 7412   PT Stop Time 1236   PT Time Calculation (min) 51 min   Activity Tolerance Patient tolerated treatment well   Behavior During Therapy [facility] for tasks assessed/performed      Past Medical History  Diagnosis Date  . Hypertension     Past Surgical History  Procedure Laterality Date  . Lumbar laminectomy Bilateral 2002    There were no vitals filed for this visit.  Visit Diagnosis:  General weakness  Bilateral knee pain  Bilateral hip pain  Abnormal posture      Subjective Assessment - 03/31/15 1147    Subjective she reports being more achy, " I believe it is because I stopped taking as much of my prednisone"   Currently in Pain? Yes   Pain Score 3    Pain Location Hip   Pain Orientation Right;Left   Pain Descriptors / Indicators Aching;Dull   Pain Type Chronic pain   Pain Onset More than a month ago   Pain Frequency Intermittent   Pain Score 5   Pain Location Knee   Pain Orientation Right   Pain Descriptors / Indicators Aching;Sore   Pain Onset More than a month ago   Pain Frequency Intermittent                       [facility] Adult PT Treatment/Exercise - 03/31/15 1149    Lumbar Exercises: Stretches   Passive Hamstring Stretch 3 reps;30 seconds   Quad Stretch 3 reps;30 seconds   Piriformis Stretch 3 reps;30 seconds   Lumbar Exercises: Aerobic   Stationary Bike L3 x 6 min   Lumbar Exercises: Seated   Sit to Stand 10 reps  5 reps with table elevated, 5 reps with table lowered   Other  Seated Lumbar Exercises pelvic tilts on dynadisc x 10   Other Seated Lumbar Exercises seated marching on dynadisc while sitting on table   Knee/Hip Exercises: Sidelying   Hip ABduction AROM;Strengthening;Right;10 reps  4#   Other Sidelying Knee Exercises Reverse clam shells x 15  4#    Manual Therapy   Manual Therapy Massage   Massage tack and stretch of the piriformis, DTM/trigger point release of the piriformis using tennis ball                PT Education - 03/31/15 1225    Education provided Yes   Education Details reverse clam shells, hip abduction.   Person(s) Educated Patient   Methods Explanation   Comprehension Verbalized understanding          PT Short Term Goals - 03/29/15 1508    PT SHORT TERM GOAL #1   Title pt will be I with basic HEP (04/05/2015)   Baseline questions   Time 3   Period Weeks   Status On-going           PT Long Term Goals - 03/29/15 1509    PT LONG TERM GOAL #1   Title pt will decrease pain to <  3/10 following walking/ standing for >30 minutes to assist with ADL's (04/26/2015)   Time 6   Period Weeks   Status On-going   PT LONG TERM GOAL #2   Title pt will increase FOTO score by > 10 points to help functional capacity (04/26/2015)   Time 6   Period Weeks   Status Unable to assess   PT LONG TERM GOAL #3   Title pt will increase Bil lower extremity strenght to > 4/5 to assist with prolonged walking and lifting for exercise(04/26/2015)   Time 6   Period Weeks   Status On-going   PT LONG TERM GOAL #4   Title pt will be able to verbalize and demonstrate techniques to reduce risk of bil hip/ knee reinjury by postural awareness, lifting and carrying mechanics, and HEP. (04/26/2015)   Time 6   Period Weeks   Status On-going               Plan - 03/31/15 1236    Clinical Impression Statement Railey continues to report progress with decreased hip and knee pain follow treatment. She demonstrated difficulty during sit to stand  activities demonstrating an exaggerated rocking method to stand up from sitting, plan to encorporate into treatment. Also plan to begin gait training to promote normal/efficent gait pattern,  due to antalgic gait pattern with limited step length on the R .   PT Next Visit Plan  Stretching and strengthening of knee musculature, STM for glute/piriformis tightness/pain, modalities for pain PRN, gait training, functional sit to stand strengthening   PT Home Exercise Plan sidelying hip abduction, reverse clam shells   Consulted and Agree with Plan of Care Patient        Problem List There are no active problems to display for this patient.  Starr Lake PT, DPT, LAT, ATC  03/31/2015  12:46 PM   [facility] [address] [place], [place], [address] Phone: [phone]   Fax:  [phone]

## 2015-04-05 ENCOUNTER — Ambulatory Visit: Payer: Medicare Other | Admitting: Physical Therapy

## 2015-04-05 DIAGNOSIS — M25551 Pain in right hip: Secondary | ICD-10-CM | POA: Diagnosis not present

## 2015-04-05 DIAGNOSIS — R531 Weakness: Secondary | ICD-10-CM

## 2015-04-05 NOTE — Therapy (Signed)
[place], [place], [address] Phone: [phone]   Fax:  [phone]  Physical Therapy Treatment  Patient Details  Name: Victoria Wallace MRN: [id number] Date of Birth: 28-Jan-1951 Referring Provider:  Levin Erp, MD  Encounter Date: 04/05/2015      PT End of Session - 04/05/15 1259    Visit Number 6   Number of Visits 12   Date for PT Re-Evaluation 04/26/15   PT Start Time 1105   PT Stop Time 1147   PT Time Calculation (min) 42 min   Activity Tolerance Patient tolerated treatment well      Past Medical History  Diagnosis Date  . Hypertension     Past Surgical History  Procedure Laterality Date  . Lumbar laminectomy Bilateral 2002    There were no vitals filed for this visit.  Visit Diagnosis:  General weakness      Subjective Assessment - 04/05/15 1105    Subjective No pain, ball is good,  Limps.  A general aching, an in motion discomfort, Rt knee.  Religious doing her homework.   already been to the pool.   Currently in Pain? No/denies   Pain Location Knee  hip sore not pain   Pain Orientation Right;Left   Pain Radiating Towards Rt knee.  Hips Rt   Pain Relieving Factors stretches   Effect of Pain on Daily Activities Limps.   Multiple Pain Sites No                         [facility] Adult PT Treatment/Exercise - 04/05/15 0001    Knee/Hip Exercises: Machines for Strengthening   Cybex Knee Flexion 20 reps  Hip Machine 2 plates Abduction 10 reps each, flexion  plates   Knee/Hip Exercises: Standing   Heel Raises 10 reps   Forward Step Up Both;1 set;10 reps;Hand Hold: 0;Step Height: 6"   Functional Squat --  hip hinge, pole, also single leg moderate cues for low back    Manual Therapy   Massage --  neuromuscular trigger point release. Rt gluteal. tissue soft                  PT Short Term Goals - 04/05/15 1301    PT SHORT TERM GOAL #1   Title pt will be I  with basic HEP (04/05/2015)   Baseline no questions today   Time 3   Period Weeks   Status Achieved           PT Long Term Goals - 04/05/15 1302    PT LONG TERM GOAL #1   Title pt will decrease pain to <3/10 following walking/ standing for >30 minutes to assist with ADL's (04/26/2015)   Time 6   Period Weeks   Status On-going   PT LONG TERM GOAL #2   Title pt will increase FOTO score by > 10 points to help functional capacity (04/26/2015)   Time 6   Period Weeks   Status Unable to assess   PT LONG TERM GOAL #3   Title pt will increase Bil lower extremity strenght to > 4/5 to assist with prolonged walking and lifting for exercise(04/26/2015)   Time 6   Period Weeks   Status Unable to assess   PT LONG TERM GOAL #4   Title pt will be able to verbalize and demonstrate techniques to reduce risk of bil hip/ knee reinjury by postural awareness, lifting and carrying mechanics,  and HEP. (04/26/2015)   Time 6   Period Weeks   Status On-going               Plan - 04/05/15 1300    Clinical Impression Statement continue to work on strengthening.  Able to step up with more gluteal use, 6 inches, no new goals met, she is still limping.   PT Next Visit Plan  Stretching and strengthening of knee musculature, STM for glute/piriformis tightness/pain, modalities for pain PRN, gait training, functional sit to stand strengthening        Problem List There are no active problems to display for this patient. Melvenia Needles, PTA 04/05/2015 1:03 PM Phone: [phone] Fax: [phone]   [facility] 04/05/2015, 1:03 PM  [facility] [address] [place], [place], [address] Phone: [phone]   Fax:  [phone]

## 2015-04-07 ENCOUNTER — Ambulatory Visit: Payer: Medicare Other | Admitting: Physical Therapy

## 2015-04-07 DIAGNOSIS — M25552 Pain in left hip: Secondary | ICD-10-CM

## 2015-04-07 DIAGNOSIS — M25562 Pain in left knee: Secondary | ICD-10-CM

## 2015-04-07 DIAGNOSIS — M25551 Pain in right hip: Secondary | ICD-10-CM

## 2015-04-07 DIAGNOSIS — M25561 Pain in right knee: Secondary | ICD-10-CM

## 2015-04-07 DIAGNOSIS — R531 Weakness: Secondary | ICD-10-CM

## 2015-04-07 DIAGNOSIS — R293 Abnormal posture: Secondary | ICD-10-CM

## 2015-04-07 NOTE — Therapy (Signed)
[place], [place], [address] Phone: [phone]   Fax:  [phone]  Physical Therapy Treatment  Patient Details  Name: Victoria Wallace MRN: [id number] Date of Birth: 10/11/1951 Referring Provider:  Levin Erp, MD  Encounter Date: 04/07/2015      PT End of Session - 04/07/15 1241    Visit Number 7   Number of Visits 12   Date for PT Re-Evaluation 04/26/15   PT Start Time 0630   PT Stop Time 1233   PT Time Calculation (min) 48 min   Activity Tolerance Patient tolerated treatment well   Behavior During Therapy [facility] for tasks assessed/performed      Past Medical History  Diagnosis Date  . Hypertension     Past Surgical History  Procedure Laterality Date  . Lumbar laminectomy Bilateral 2002    There were no vitals filed for this visit.  Visit Diagnosis:  General weakness  Bilateral knee pain  Bilateral hip pain  Abnormal posture      Subjective Assessment - 04/07/15 1157    Subjective " I feel like there are underlying structural issues in my knee and piriformis." she reports feeling better but is still a long way from where she wants to .    Currently in Pain? Yes   Pain Score 3    Pain Location Knee   Pain Orientation Right   Pain Descriptors / Indicators Sore   Pain Type Chronic pain   Pain Onset More than a month ago   Pain Frequency Intermittent   Pain Score 1   Pain Location Hip   Pain Orientation Right   Pain Descriptors / Indicators Dull;Aching   Pain Type Chronic pain   Pain Onset More than a month ago   Pain Frequency Intermittent                         [facility] Adult PT Treatment/Exercise - 04/07/15 1202    Lumbar Exercises: Stretches   Piriformis Stretch 3 reps;30 seconds   Lumbar Exercises: Aerobic   Stationary Bike L3 x 10 min   Lumbar Exercises: Seated   Sit to Stand 15 reps   Lumbar Exercises: Supine   Bridge 10 reps  with alt knee  extension   Knee/Hip Exercises: Supine   Straight Leg Raises AROM;Strengthening;Right;1 set;10 reps  4#   Straight Leg Raise with External Rotation AROM;Strengthening;Right;1 set;15 reps  with quad set   Knee/Hip Exercises: Sidelying   Hip ABduction AROM;Strengthening;Right;15 reps;1 set  4#   Other Sidelying Knee Exercises Reverse clam shells x 15  4#                PT Education - 04/07/15 1241    Education provided Yes   Education Details parapspinal stretch   Person(s) Educated Patient   Methods Explanation   Comprehension Verbalized understanding          PT Short Term Goals - 04/05/15 1301    PT SHORT TERM GOAL #1   Title pt will be I with basic HEP (04/05/2015)   Baseline no questions today   Time 3   Period Weeks   Status Achieved           PT Long Term Goals - 04/05/15 1302    PT LONG TERM GOAL #1   Title pt will decrease pain to <3/10 following walking/ standing for >30 minutes to assist with ADL's (04/26/2015)   Time  6   Period Weeks   Status On-going   PT LONG TERM GOAL #2   Title pt will increase FOTO score by > 10 points to help functional capacity (04/26/2015)   Time 6   Period Weeks   Status Unable to assess   PT LONG TERM GOAL #3   Title pt will increase Bil lower extremity strenght to > 4/5 to assist with prolonged walking and lifting for exercise(04/26/2015)   Time 6   Period Weeks   Status Unable to assess   PT LONG TERM GOAL #4   Title pt will be able to verbalize and demonstrate techniques to reduce risk of bil hip/ knee reinjury by postural awareness, lifting and carrying mechanics, and HEP. (04/26/2015)   Time 6   Period Weeks   Status On-going               Plan - 04/07/15 1241    Clinical Impression Statement Wilmina continues to report relief of symptoms but states that she isn't to where she wants to be at yet.  she continues to demonstrate a limp, and has difficulty with sit to stands from table at a mid/low level  demonstrating a forward rocking to get momentum. plan to continue working with stairs and gait training. She continues to be impulsive with her HEP requiring verbal cueing to not do too much.    PT Next Visit Plan  Stretching and strengthening of knee musculature, STM for glute/piriformis tightness/pain, modalities for pain PRN, gait training, functional sit to stand strengthening   PT Home Exercise Plan bridges with alt knee ext   Consulted and Agree with Plan of Care Patient        Problem List There are no active problems to display for this patient.  Starr Lake PT, DPT, LAT, ATC  04/07/2015  12:46 PM      [facility] [address] [place], [place], [address] Phone: [phone]   Fax:  [phone]

## 2015-04-07 NOTE — Patient Instructions (Signed)
   Devanshi Califf PT, DPT, LAT, ATC  [facility] Phone: [phone]     

## 2015-04-12 ENCOUNTER — Ambulatory Visit: Payer: Medicare Other | Admitting: Physical Therapy

## 2015-04-14 ENCOUNTER — Ambulatory Visit: Payer: Medicare Other | Admitting: Physical Therapy

## 2015-04-14 DIAGNOSIS — M25562 Pain in left knee: Secondary | ICD-10-CM

## 2015-04-14 DIAGNOSIS — M25551 Pain in right hip: Secondary | ICD-10-CM | POA: Diagnosis not present

## 2015-04-14 DIAGNOSIS — M25561 Pain in right knee: Secondary | ICD-10-CM

## 2015-04-14 DIAGNOSIS — R293 Abnormal posture: Secondary | ICD-10-CM

## 2015-04-14 DIAGNOSIS — R531 Weakness: Secondary | ICD-10-CM

## 2015-04-14 DIAGNOSIS — M25552 Pain in left hip: Secondary | ICD-10-CM

## 2015-04-14 NOTE — Patient Instructions (Signed)
   Kristoffer Leamon PT, DPT, LAT, ATC  [facility] Phone: [phone]     

## 2015-04-14 NOTE — Therapy (Signed)
[place], [place], [address] Phone: [phone]   Fax:  [phone]  Physical Therapy Treatment  Patient Details  Name: [place] MRN: [id number] Date of Birth: Jun 08, 1951 Referring Provider:  Levin Erp, MD  Encounter Date: 04/14/2015      PT End of Session - 04/14/15 1433    Visit Number 8   Number of Visits 12   Date for PT Re-Evaluation 04/26/15   PT Start Time 1100   PT Stop Time 1148   PT Time Calculation (min) 48 min   Activity Tolerance Patient tolerated treatment well   Behavior During Therapy [facility] for tasks assessed/performed      Past Medical History  Diagnosis Date  . Hypertension     Past Surgical History  Procedure Laterality Date  . Lumbar laminectomy Bilateral 2002    There were no vitals filed for this visit.  Visit Diagnosis:  General weakness  Bilateral knee pain  Bilateral hip pain  Abnormal posture      Subjective Assessment - 04/14/15 1105    Subjective she reports that she was really sore on thursday and Friday. "I've noticed I am getting stronger and am walking better."   Currently in Pain? Yes   Pain Score 3   5/10 with walking   Pain Location Knee   Pain Orientation Right   Pain Descriptors / Indicators Sore   Pain Onset More than a month ago   Pain Frequency Intermittent   Pain Score --  .5/10   Pain Location Hip   Pain Orientation Right   Pain Descriptors / Indicators Aching                         [facility] Adult PT Treatment/Exercise - 04/14/15 0001    Lumbar Exercises: Stretches   Passive Hamstring Stretch 2 reps;30 seconds   Single Knee to Chest Stretch 2 reps;30 seconds   Quad Stretch 2 reps;30 seconds   Piriformis Stretch 2 reps;30 seconds   Lumbar Exercises: Aerobic   Stationary Bike L3 x 10 min   Lumbar Exercises: Supine   Clam 10 reps  2 x green band   Bridge 5 reps  with alt kick outs 2 x   Straight Leg  Raise 10 reps;3 seconds   Knee/Hip Exercises: Machines for Strengthening   Cybex Leg Press 2 x 10 20#   with eccentric control   Knee/Hip Exercises: Standing   Other Standing Knee Exercises hip hiking on 4 in step x 15                PT Education - 04/14/15 1432    Education provided Yes   Education Details glute stretch   Person(s) Educated Patient   Methods Explanation   Comprehension Verbalized understanding          PT Short Term Goals - 04/05/15 1301    PT SHORT TERM GOAL #1   Title pt will be I with basic HEP (04/05/2015)   Baseline no questions today   Time 3   Period Weeks   Status Achieved           PT Long Term Goals - 04/05/15 1302    PT LONG TERM GOAL #1   Title pt will decrease pain to <3/10 following walking/ standing for >30 minutes to assist with ADL's (04/26/2015)   Time 6   Period Weeks   Status On-going   PT LONG TERM  GOAL #2   Title pt will increase FOTO score by > 10 points to help functional capacity (04/26/2015)   Time 6   Period Weeks   Status Unable to assess   PT LONG TERM GOAL #3   Title pt will increase Bil lower extremity strenght to > 4/5 to assist with prolonged walking and lifting for exercise(04/26/2015)   Time 6   Period Weeks   Status Unable to assess   PT LONG TERM GOAL #4   Title pt will be able to verbalize and demonstrate techniques to reduce risk of bil hip/ knee reinjury by postural awareness, lifting and carrying mechanics, and HEP. (04/26/2015)   Time 6   Period Weeks   Status On-going               Plan - 04/14/15 1433    Clinical Impression Statement Victoria [place] presents to therapy stating she was sore after the last visit but states that it has gotten better. She tolerated exercises well today with only complaint of feeling a little sore but  tha tit was good.  She continues to require VC that too much exercise can cause problems. plan to progress with hip strengthening and balance activities   PT Next Visit Plan   Stretching and strengthening of knee musculature, STM for glute/piriformis tightness/pain, modalities for pain PRN, gait training, functional sit to stand strengthening   PT Home Exercise Plan hip strengthening   Consulted and Agree with Plan of Care Patient        Problem List There are no active problems to display for this patient.  Starr Lake PT, DPT, LAT, ATC  04/14/2015  3:00 PM   [facility] [address] [place], [place], [address] Phone: [phone]   Fax:  [phone]

## 2015-04-19 ENCOUNTER — Ambulatory Visit: Payer: Medicare Other | Attending: Internal Medicine | Admitting: Physical Therapy

## 2015-04-19 DIAGNOSIS — M25551 Pain in right hip: Secondary | ICD-10-CM

## 2015-04-19 DIAGNOSIS — M25562 Pain in left knee: Secondary | ICD-10-CM | POA: Insufficient documentation

## 2015-04-19 DIAGNOSIS — R293 Abnormal posture: Secondary | ICD-10-CM | POA: Insufficient documentation

## 2015-04-19 DIAGNOSIS — M25552 Pain in left hip: Secondary | ICD-10-CM | POA: Diagnosis not present

## 2015-04-19 DIAGNOSIS — R531 Weakness: Secondary | ICD-10-CM | POA: Diagnosis not present

## 2015-04-19 DIAGNOSIS — M25561 Pain in right knee: Secondary | ICD-10-CM | POA: Diagnosis not present

## 2015-04-19 NOTE — Therapy (Signed)
[place], [place], [address] Phone: [phone]   Fax:  [phone]  Physical Therapy Treatment  Patient Details  Name: [place] MRN: [id number] Date of Birth: [birth year] Referring Provider:  Levin Erp, MD  Encounter Date: 04/19/2015      PT End of Session - 04/19/15 1515    Visit Number 9   Number of Visits 12   Date for PT Re-Evaluation 04/26/15   PT Start Time 1500   PT Stop Time 1545   PT Time Calculation (min) 45 min   Activity Tolerance Patient tolerated treatment well   Behavior During Therapy [facility] for tasks assessed/performed      Past Medical History  Diagnosis Date  . Hypertension     Past Surgical History  Procedure Laterality Date  . Lumbar laminectomy Bilateral 2002    There were no vitals filed for this visit.  Visit Diagnosis:  General weakness  Bilateral knee pain  Bilateral hip pain  Abnormal posture      Subjective Assessment - 04/19/15 1503    Subjective She reports that she has had some dull ache, but has noticed a increase in her functionality.    Currently in Pain? Yes   Pain Score 2    Pain Location Knee   Pain Orientation Right   Pain Descriptors / Indicators Aching   Pain Type Chronic pain   Pain Onset More than a month ago   Pain Frequency Intermittent   Pain Score 2   Pain Location Hip   Pain Orientation Right   Pain Descriptors / Indicators Aching   Pain Type Chronic pain   Pain Onset More than a month ago   Pain Frequency Intermittent                         [facility] Adult PT Treatment/Exercise - 04/19/15 1506    Lumbar Exercises: Stretches   Passive Hamstring Stretch 2 reps;30 seconds   Single Knee to Chest Stretch 2 reps;30 seconds   Quad Stretch 2 reps;30 seconds   Lumbar Exercises: Aerobic   Stationary Bike L3 x 8 min   Lumbar Exercises: Supine   Straight Leg Raise 15 reps;1 second  3#   Knee/Hip Exercises:  Machines for Strengthening   Cybex Knee Extension 1 x 10 15#   Cybex Leg Press 1 x 8 40#, 1 x 6 60#   Knee/Hip Exercises: Standing   Heel Raises 1 set;15 reps   Forward Step Up Both;1 set;10 reps;Hand Hold: 0;Step Height: 6"   Other Standing Knee Exercises hip hiking on 4 in step x 15   Other Standing Knee Exercises lateral band walks 2 laps x 15 ft                PT Education - 04/19/15 1548    Education provided Yes   Education Details postural education handout   Person(s) Educated Patient   Methods Explanation   Comprehension Verbalized understanding          PT Short Term Goals - 04/05/15 1301    PT SHORT TERM GOAL #1   Title pt will be I with basic HEP (04/05/2015)   Baseline no questions today   Time 3   Period Weeks   Status Achieved           PT Long Term Goals - 04/19/15 1515    PT LONG TERM GOAL #1   Title pt will  decrease pain to <3/10 following walking/ standing for >30 minutes to assist with ADL's (04/26/2015)   Time 6   Period Weeks   Status Achieved   PT LONG TERM GOAL #2   Title pt will increase FOTO score by > 10 points to help functional capacity (04/26/2015)   Time 6   Period Weeks   Status On-going   PT LONG TERM GOAL #3   Title pt will increase Bil lower extremity strenght to > 4/5 to assist with prolonged walking and lifting for exercise(04/26/2015)   Time 6   Period Weeks   Status On-going   PT LONG TERM GOAL #4   Title pt will be able to verbalize and demonstrate techniques to reduce risk of bil hip/ knee reinjury by postural awareness, lifting and carrying mechanics, and HEP. (04/26/2015)   Time 6   Period Weeks   Status Achieved               Plan - 04/19/15 1548    Clinical Impression Statement Delesha continues to make progress with increased strength and decreased pain in combination with improved function. She continues to demonstrate difficulty with leg press with hip hikes due to weakness. She continues to demonstrate an  antalgic gait pattern with right lateral lean during ambulation. plan to progress strengthening as tolerated.    PT Next Visit Plan  Stretching and strengthening of knee musculature, STM for glute/piriformis tightness/pain, modalities for pain PRN, gait training, functional sit to stand strengthening   PT Home Exercise Plan posture education handout        Problem List There are no active problems to display for this patient.  Starr Lake PT, DPT, LAT, ATC  04/19/2015  4:47 PM      [facility] [address] [place], [place], [address] Phone: [phone]   Fax:  [phone]

## 2015-04-19 NOTE — Patient Instructions (Addendum)

## 2015-04-21 ENCOUNTER — Ambulatory Visit: Payer: Medicare Other | Admitting: Physical Therapy

## 2015-04-21 DIAGNOSIS — M25561 Pain in right knee: Secondary | ICD-10-CM

## 2015-04-21 DIAGNOSIS — M25551 Pain in right hip: Secondary | ICD-10-CM

## 2015-04-21 DIAGNOSIS — R531 Weakness: Secondary | ICD-10-CM

## 2015-04-21 DIAGNOSIS — M25562 Pain in left knee: Secondary | ICD-10-CM

## 2015-04-21 DIAGNOSIS — M25552 Pain in left hip: Secondary | ICD-10-CM

## 2015-04-21 DIAGNOSIS — R293 Abnormal posture: Secondary | ICD-10-CM

## 2015-04-21 NOTE — Therapy (Signed)
[place], [place], [address] Phone: [phone]   Fax:  [phone]  Physical Therapy Treatment  Patient Details  Name: [place] MRN: [id number] Date of Birth: 1951/05/15 Referring Provider:  Levin Erp, MD  Encounter Date: 04/21/2015      PT End of Session - 04/21/15 1406    Visit Number 10   Number of Visits 18   Date for PT Re-Evaluation 05/28/15   PT Start Time 1330   PT Stop Time 1415   PT Time Calculation (min) 45 min   Activity Tolerance Patient tolerated treatment well   Behavior During Therapy [facility] for tasks assessed/performed      Past Medical History  Diagnosis Date  . Hypertension     Past Surgical History  Procedure Laterality Date  . Lumbar laminectomy Bilateral 2002    There were no vitals filed for this visit.  Visit Diagnosis:  General weakness - Plan: PT plan of care cert/re-cert  Bilateral knee pain - Plan: PT plan of care cert/re-cert  Bilateral hip pain - Plan: PT plan of care cert/re-cert  Abnormal posture - Plan: PT plan of care cert/re-cert      Subjective Assessment - 04/21/15 1337    Subjective She reports that overall everything is coming along. "I have some soreness, in my legs but its not bad.    Currently in Pain? Yes   Pain Score 3    Pain Location Knee   Pain Orientation Right   Pain Descriptors / Indicators Aching   Pain Type Chronic pain   Pain Onset More than a month ago   Pain Frequency Intermittent   Aggravating Factors  going up steps, rising from deep seated position.    Pain Relieving Factors exercises HEP   Pain Score 1   Pain Location Hip   Pain Orientation Right   Pain Descriptors / Indicators Aching   Pain Type Chronic pain   Pain Onset More than a month ago   Pain Frequency Intermittent   Aggravating Factors  going up the steps (but is better)   Pain Relieving Factors sitting, HEP            [facility] PT Assessment -  04/21/15 1341    Observation/Other Assessments   Focus on Therapeutic Outcomes (FOTO)  49% limitation   AROM   Overall AROM  Within functional limits for tasks performed   Strength   Right Hip Flexion 4/5   Right Hip Extension 3+/5   Right Hip ABduction 4+/5   Right Hip ADduction 5/5   Left Hip Flexion 4/5   Left Hip Extension 3+/5   Left Hip ABduction 4/5   Left Hip ADduction 5/5   Right Knee Flexion 5/5   Right Knee Extension 5/5   Left Knee Flexion 4+/5   Left Knee Extension 4+/5                     [facility] Adult PT Treatment/Exercise - 04/21/15 0001    Lumbar Exercises: Aerobic   Stationary Bike Nu Step L5 x 6 min  pushing with legs only.   Lumbar Exercises: Seated   Sit to Stand 10 reps  3 x with table lowered between sets   Knee/Hip Exercises: Standing   Heel Raises 2 sets;15 reps   Forward Step Up Both;2 sets;10 reps;Step Height: 6"   Other Standing Knee Exercises hip hiking on 6 in step x 15   Other Standing Knee Exercises  standing hip abduction/ extenions 2 x 15 ea.   bil                PT Education - 2015/04/22 1500    Education provided Yes   Education Details updated POC, HEP review. Talked about getting on [name] schedule for some pilate exercises   Person(s) Educated Patient   Methods Explanation   Comprehension Verbalized understanding          PT Short Term Goals - 04/05/15 1301    PT SHORT TERM GOAL #1   Title pt will be I with basic HEP (04/05/2015)   Baseline no questions today   Time 3   Period Weeks   Status Achieved           PT Long Term Goals - Apr 22, 2015 1502    PT LONG TERM GOAL #1   Title pt will decrease pain to <3/10 following walking/ standing for >30 minutes to assist with ADL's (04/26/2015)   Time 6   Period Weeks   Status Achieved   PT LONG TERM GOAL #2   Title pt will increase FOTO score by > 10 points to help functional capacity (04/26/2015)   Time 6   Period Weeks   Status On-going   PT LONG TERM  GOAL #3   Title pt will increase Bil lower extremity strenght to > 4+/5 to assist with prolonged walking and lifting for exercise(04/26/2015)   Time 6   Period Weeks   Status Partially Met   PT LONG TERM GOAL #4   Title pt will be able to verbalize and demonstrate techniques to reduce risk of bil hip/ knee reinjury by postural awareness, lifting and carrying mechanics, and HEP. (04/26/2015)   Period Weeks   Status Achieved               Plan - Apr 22, 2015 1410    Clinical Impression Statement [name] has made great progress with decreased pain during prolong standing activities.she partially met LTG #3. She has made progress with strengthening of bil LE except for glutes which upon this visit was rated at a 3+/5. she demonstrates difficulty with sit to stands from a lower surface demonstrating an exagerrated trunk lean to stand up. She continues to demonstrate an impulsive nature to overdo her HEP adding what was done every visit and requires VC to only do the exercises that are given.PT spoke with pt about pilates and will try to get her on [name] schedule to do some pilates to facilitate general hip and core strengtheing. She would benefit from continued plan of care.    Pt will benefit from skilled therapeutic intervention in order to improve on the following deficits Pain;Decreased strength;Postural dysfunction;Decreased endurance;Decreased activity tolerance;Increased muscle spasms;Increased fascial restricitons   PT Frequency 2x / week   PT Duration 4 weeks   PT Treatment/Interventions ADLs/Self Care Home Management;Moist Heat;Therapeutic activities;Patient/family education;Therapeutic exercise;Ultrasound;Manual techniques;Dry needling;Electrical Stimulation;Cryotherapy;Neuromuscular re-education   PT Next Visit Plan  Stretching and strengthening of knee musculature, STM for glute/piriformis tightness/pain, modalities for pain PRN, gait training, functional sit to stand strengthening,  pilates with [name]?   PT Home Exercise Plan HEP review.   Consulted and Agree with Plan of Care Patient          G-Codes - 22-Apr-2015 1515    Functional Assessment Tool Used FOTO   Functional Limitation Mobility: Walking and moving around   Mobility: Walking and Moving Around Current Status ([id number]) At least 40 percent but less than 60 percent  impaired, limited or restricted   Mobility: Walking and Moving Around Goal Status [phone]) At least 20 percent but less than 40 percent impaired, limited or restricted      Problem List There are no active problems to display for this patient.  Starr Lake PT, DPT, LAT, ATC  04/21/2015  3:24 PM   [facility] [address] [place], [place], [address] Phone: [phone]   Fax:  [phone]

## 2015-04-26 ENCOUNTER — Encounter: Payer: Medicare Other | Admitting: Physical Therapy

## 2015-04-28 ENCOUNTER — Ambulatory Visit: Payer: Medicare Other | Admitting: Physical Therapy

## 2015-04-28 DIAGNOSIS — M25551 Pain in right hip: Secondary | ICD-10-CM | POA: Diagnosis not present

## 2015-04-28 DIAGNOSIS — R531 Weakness: Secondary | ICD-10-CM

## 2015-04-28 NOTE — Therapy (Signed)
[place], [place], [address] Phone: [phone]   Fax:  [phone]  Physical Therapy Treatment  Patient Details  Name: Victoria [place] MRN: [id number] Date of Birth: [birth year] Referring Provider:  Levin Erp, MD  Encounter Date: 04/28/2015      PT End of Session - 04/28/15 1415    Visit Number 11   Number of Visits 18   Date for PT Re-Evaluation 05/28/15   PT Start Time 1330   PT Stop Time 1415   PT Time Calculation (min) 45 min   Activity Tolerance Patient tolerated treatment well      Past Medical History  Diagnosis Date  . Hypertension     Past Surgical History  Procedure Laterality Date  . Lumbar laminectomy Bilateral 2002    There were no vitals filed for this visit.  Visit Diagnosis:  General weakness      Subjective Assessment - 04/28/15 1339    Subjective Uses tennis ball less, Limps less, Doing her exercises. Uses less upper body less with going up steps.  More flexible.   Currently in Pain? No/denies   Pain Score --  up to 3/10 at the most   Pain Orientation Right   Pain Descriptors / Indicators --  discomfort/ knee   Aggravating Factors  going up steps   Pain Relieving Factors exercises   Effect of Pain on Daily Activities extra time on stairs   Multiple Pain Sites No                         [facility] Adult PT Treatment/Exercise - 04/28/15 1346    Knee/Hip Exercises: Stretches   Gastroc Stretch 3 reps;30 seconds  both on step, cued to create more arch to avoid pronation.   Knee/Hip Exercises: Standing   Forward Step Up Both;2 sets;10 reps;Hand Hold: 0;Step Height: 6"   Functional Squat --  around mat 1 rep each way   SLS with Vectors --  3 best plytoss green ball , 6 brst Lt   Ankle Exercises: Seated   Other Seated Ankle Exercises Red IV/EV  ankle 10 reps each                PT Education - 04/28/15 1415    Education provided Yes   Education Details ankle band   Person(s) Educated Patient   Methods Explanation;Demonstration;Handout;Tactile cues   Comprehension Verbalized understanding;Returned demonstration          PT Short Term Goals - 04/05/15 1301    PT SHORT TERM GOAL #1   Title pt will be I with basic HEP (04/05/2015)   Baseline no questions today   Time 3   Period Weeks   Status Achieved           PT Long Term Goals - 04/28/15 1416    PT LONG TERM GOAL #1   Title pt will decrease pain to <3/10 following walking/ standing for >30 minutes to assist with ADL's (04/26/2015)   Status Achieved   PT LONG TERM GOAL #2   Title pt will increase FOTO score by > 10 points to help functional capacity (04/26/2015)   Status Unable to assess   PT LONG TERM GOAL #3   Time 6   Status Partially Met   PT LONG TERM GOAL #4   Title pt will be able to verbalize and demonstrate techniques to reduce risk of bil hip/ knee reinjury by postural awareness,  lifting and carrying mechanics, and HEP. (04/26/2015)   Status Achieved               Plan - 04/28/15 1415    Clinical Impression Statement Review anlke bands for IV/EV   PT Next Visit Plan  Stretching and strengthening of knee musculature, STM for glute/piriformis tightness/pain, modalities for pain PRN, gait training, functional sit to stand strengthening, pilates with [name]?   Consulted and Agree with Plan of Care Patient        Problem List There are no active problems to display for this patient.   [facility] 04/28/2015, 2:18 PM  [facility] [address] [place], [place], [address] Phone: [phone]   Fax:  [phone]   Melvenia Needles, PTA 04/28/2015 2:18 PM Phone: [phone] Fax: [phone]

## 2015-05-03 ENCOUNTER — Ambulatory Visit: Payer: Medicare Other | Admitting: Physical Therapy

## 2015-05-03 DIAGNOSIS — M25552 Pain in left hip: Secondary | ICD-10-CM

## 2015-05-03 DIAGNOSIS — M25551 Pain in right hip: Secondary | ICD-10-CM

## 2015-05-03 DIAGNOSIS — R531 Weakness: Secondary | ICD-10-CM

## 2015-05-03 DIAGNOSIS — R293 Abnormal posture: Secondary | ICD-10-CM

## 2015-05-03 DIAGNOSIS — M25561 Pain in right knee: Secondary | ICD-10-CM

## 2015-05-03 DIAGNOSIS — M25562 Pain in left knee: Secondary | ICD-10-CM

## 2015-05-03 NOTE — Therapy (Signed)
[place], [place], [address] Phone: [phone]   Fax:  [phone]  Physical Therapy Treatment  Patient Details  Name: Victoria [place] MRN: [id number] Date of Birth: 25-Oct-1951 Referring Provider:  Levin Erp, MD  Encounter Date: 05/03/2015      PT End of Session - 05/03/15 1150    Visit Number 12   Number of Visits 18   Date for PT Re-Evaluation 05/28/15   PT Start Time 6734   Activity Tolerance Patient tolerated treatment well   Behavior During Therapy [facility] for tasks assessed/performed      Past Medical History  Diagnosis Date  . Hypertension     Past Surgical History  Procedure Laterality Date  . Lumbar laminectomy Bilateral 2002    There were no vitals filed for this visit.  Visit Diagnosis:  General weakness  Bilateral knee pain  Bilateral hip pain  Abnormal posture      Subjective Assessment - 05/03/15 1145    Subjective "I am coming off of a cold" She reports that she has been doing well since the last visit.    Currently in Pain? Yes   Pain Score 3    Pain Location Knee   Pain Orientation Right   Pain Type Chronic pain   Pain Score 2   Pain Location Hip   Pain Orientation Right   Pain Descriptors / Indicators Aching                         [facility] Adult PT Treatment/Exercise - 05/03/15 0001    Balance   Balance Assessed Yes   Lumbar Exercises: Stretches   Passive Hamstring Stretch 2 reps;30 seconds   Single Knee to Chest Stretch 2 reps;30 seconds   Quad Stretch 2 reps;30 seconds   Piriformis Stretch 2 reps;30 seconds   Lumbar Exercises: Aerobic   Stationary Bike Nu Step L6 x 5 min  goal of 25-300 : got 307 steps   Lumbar Exercises: Seated   Sit to Stand 15 reps  with table lowered between sets x 2   Sit to Stand Limitations ball between knees for proper form   Lumbar Exercises: Supine   Other Supine Lumbar Exercises pilates reformer with leg  press with feet IR/ER with all springs, bridging with 2 red bands x 10, glute single leg ext x 15 ea with 1 red    Knee/Hip Exercises: Standing   Heel Raises 2 sets;20 reps   Forward Step Up Both;2 sets;10 reps;Hand Hold: 0;Step Height: 6"   Other Standing Knee Exercises standing hip abduction/ extenions 2 x 15 ea.   3# cuff weights, VC for proper form                  PT Short Term Goals - 04/05/15 1301    PT SHORT TERM GOAL #1   Title pt will be I with basic HEP (04/05/2015)   Baseline no questions today   Time 3   Period Weeks   Status Achieved           PT Long Term Goals - 05/03/15 1239    PT LONG TERM GOAL #1   Title pt will decrease pain to <3/10 following walking/ standing for >30 minutes to assist with ADL's (04/26/2015)   Time 6   Period Weeks   Status Achieved   PT LONG TERM GOAL #2   Title pt will increase FOTO score by >  10 points to help functional capacity (04/26/2015)   Time 6   Period Weeks   Status On-going   PT LONG TERM GOAL #3   Title pt will increase Bil lower extremity strenght to > 4+/5 to assist with prolonged walking and lifting for exercise(04/26/2015)   Time 6   Period Weeks   Status Partially Met   PT LONG TERM GOAL #4   Title pt will be able to verbalize and demonstrate techniques to reduce risk of bil hip/ knee reinjury by postural awareness, lifting and carrying mechanics, and HEP. (04/26/2015)   Time 6   Period Weeks   Status Achieved               Plan - 05/03/15 1236    Clinical Impression Statement Kerry Odonohue to make progress with strength and is able to tolerate sit to stands with table lowered between sets. No new goals met this visit. she requires ball between knees to facilitate proper form. utilized Paediatric nurse to perform leg press with IR/ER and regular using all spirngs, bridges with 2 red band, and glute hip ext with 1 red. She tolerated exercises well todya with only report that the exercises were "waking the  muscles up".   PT Next Visit Plan  assess response to exercises on reformer, Stretching and strengthening of knee musculature, STM for glute/piriformis tightness/pain, modalities for pain PRN, gait training, functional sit to stand strengthening, pilates with [name]?   Consulted and Agree with Plan of Care Patient        Problem List There are no active problems to display for this patient.  Starr Lake PT, DPT, LAT, ATC  05/03/2015  12:41 PM    [facility] [address] [place], [place], [address] Phone: [phone]   Fax:  [phone]

## 2015-05-05 ENCOUNTER — Ambulatory Visit: Payer: Medicare Other | Admitting: Physical Therapy

## 2015-05-05 DIAGNOSIS — M25562 Pain in left knee: Secondary | ICD-10-CM

## 2015-05-05 DIAGNOSIS — M25551 Pain in right hip: Secondary | ICD-10-CM | POA: Diagnosis not present

## 2015-05-05 DIAGNOSIS — M25552 Pain in left hip: Secondary | ICD-10-CM

## 2015-05-05 DIAGNOSIS — M25561 Pain in right knee: Secondary | ICD-10-CM

## 2015-05-05 DIAGNOSIS — R293 Abnormal posture: Secondary | ICD-10-CM

## 2015-05-05 DIAGNOSIS — R531 Weakness: Secondary | ICD-10-CM

## 2015-05-05 NOTE — Therapy (Signed)
[place], [place], [address] Phone: [phone]   Fax:  [phone]  Physical Therapy Treatment  Patient Details  Name: Victoria Wallace MRN: [id number] Date of Birth: 09/18/1951 Referring Provider:  Levin Erp, MD  Encounter Date: 05/05/2015      PT End of Session - 05/05/15 1235    Visit Number 13   Number of Visits 18   Date for PT Re-Evaluation 05/28/15   PT Start Time 3299   PT Stop Time 1230   PT Time Calculation (min) 45 min   Activity Tolerance Patient tolerated treatment well   Behavior During Therapy [facility] for tasks assessed/performed      Past Medical History  Diagnosis Date  . Hypertension     Past Surgical History  Procedure Laterality Date  . Lumbar laminectomy Bilateral 2002    There were no vitals filed for this visit.  Visit Diagnosis:  General weakness  Bilateral knee pain  Bilateral hip pain  Abnormal posture      Subjective Assessment - 05/05/15 1157    Subjective "I can tell that I am a little sore from the reformer last visit but am doing good"   Currently in Pain? Yes   Pain Score 3    Pain Location Knee   Pain Orientation Right   Pain Descriptors / Indicators Aching;Tightness   Pain Type Chronic pain   Pain Onset More than a month ago   Pain Frequency Intermittent   Aggravating Factors  navigting stairs   Pain Relieving Factors stretches   Pain Score 1   Pain Location Hip   Pain Orientation Right   Pain Descriptors / Indicators Aching   Pain Type Chronic pain   Pain Onset More than a month ago   Pain Frequency Intermittent   Aggravating Factors  stairs   Pain Relieving Factors HEP                         [facility] Adult PT Treatment/Exercise - 05/05/15 0001    Lumbar Exercises: Stretches   Passive Hamstring Stretch 2 reps;30 seconds   Single Knee to Chest Stretch 2 reps;30 seconds   Piriformis Stretch 2 reps;30 seconds   Lumbar  Exercises: Aerobic   Stationary Bike Nu Step L6 x 5 min  423 steps   Lumbar Exercises: Seated   Sit to Stand 15 reps   Sit to Stand Limitations ball between knees for proper form   Lumbar Exercises: Supine   Other Supine Lumbar Exercises pilates reformer with leg press with feet IR/ER with all springs, bridging with 2 red bands x 10, glute single leg ext x 15 ea with 1 red    Knee/Hip Exercises: Machines for Strengthening   Hip Cybex 2 x 10 25# with glute strengthening.    Knee/Hip Exercises: Standing   Heel Raises 2 sets;20 reps   Forward Step Up Both;2 sets;10 reps;Hand Hold: 0;Step Height: 6"   Other Standing Knee Exercises hip hiking on 6 in step x 15   Other Standing Knee Exercises standing hip abduction/ extenions 2 x 15 ea.    Knee/Hip Exercises: Supine   Straight Leg Raises AROM;Strengthening;Right;1 set;10 reps  with oscillations x 10 sec   Knee/Hip Exercises: Sidelying   Hip ABduction AROM;Strengthening;Right;1 set;10 reps  4# with oscillations x 10 sec   Ankle Exercises: Seated   Other Seated Ankle Exercises green IV/EV  ankle 10 reps each  PT Education - 05/05/15 1234    Education provided Yes   Education Details walking without limp using metronome or music   Person(s) Educated Patient   Methods Explanation   Comprehension Verbalized understanding          PT Short Term Goals - 04/05/15 1301    PT SHORT TERM GOAL #1   Title pt will be I with basic HEP (04/05/2015)   Baseline no questions today   Time 3   Period Weeks   Status Achieved           PT Long Term Goals - 05/05/15 1240    PT LONG TERM GOAL #1   Title pt will decrease pain to <3/10 following walking/ standing for >30 minutes to assist with ADL's (04/26/2015)   Time 6   Period Weeks   Status Achieved   PT LONG TERM GOAL #2   Title pt will increase FOTO score by > 10 points to help functional capacity (04/26/2015)   Time 6   Period Weeks   Status On-going   PT LONG TERM  GOAL #3   Title pt will increase Bil lower extremity strenght to > 4+/5 to assist with prolonged walking and lifting for exercise(04/26/2015)   Time 6   Period Weeks   Status Partially Met   PT LONG TERM GOAL #4   Title pt will be able to verbalize and demonstrate techniques to reduce risk of bil hip/ knee reinjury by postural awareness, lifting and carrying mechanics, and HEP. (04/26/2015)   Time 6   Period Weeks   Status Achieved               Plan - 05/05/15 1235    Clinical Impression Statement Dell presents to therapy today stating that she is doing well since the last visit. She tolerated exercises well today. she reported some soreness from using the reformer last visit. performed combination of hip and ankle strengthening to assist increased Bil le stability. plan to progress with strengthening as tolerated.    PT Next Visit Plan  Stretching and strengthening of knee musculature, STM for glute/piriformis tightness/pain, modalities for pain PRN, gait training, functional sit to stand strengthening, pilates with [name]?   PT Home Exercise Plan walking without limp utilizing metronome or music.        Problem List There are no active problems to display for this patient.  Starr Lake PT, DPT, LAT, ATC  05/05/2015  12:45 PM    [facility] [address] [place], [place], [address] Phone: [phone]   Fax:  [phone]

## 2015-05-10 ENCOUNTER — Ambulatory Visit: Payer: Medicare Other | Admitting: Physical Therapy

## 2015-05-10 DIAGNOSIS — M25551 Pain in right hip: Secondary | ICD-10-CM

## 2015-05-10 DIAGNOSIS — R531 Weakness: Secondary | ICD-10-CM

## 2015-05-10 DIAGNOSIS — M25552 Pain in left hip: Secondary | ICD-10-CM

## 2015-05-10 DIAGNOSIS — M25561 Pain in right knee: Secondary | ICD-10-CM

## 2015-05-10 DIAGNOSIS — R293 Abnormal posture: Secondary | ICD-10-CM

## 2015-05-10 DIAGNOSIS — M25562 Pain in left knee: Secondary | ICD-10-CM

## 2015-05-10 NOTE — Therapy (Signed)
[place], [place], [address] Phone: [phone]   Fax:  [phone]  Physical Therapy Treatment  Patient Details  Name: Victoria Wallace MRN: [id number] Date of Birth: Dec 16, 1951 Referring Provider:  Levin Erp, MD  Encounter Date: 05/10/2015      PT End of Session - 05/10/15 1212    Visit Number 14   Number of Visits 18   Date for PT Re-Evaluation 05/28/15   PT Start Time 3888   PT Stop Time 1230   PT Time Calculation (min) 45 min   Activity Tolerance Patient tolerated treatment well   Behavior During Therapy [facility] for tasks assessed/performed      Past Medical History  Diagnosis Date  . Hypertension     Past Surgical History  Procedure Laterality Date  . Lumbar laminectomy Bilateral 2002    There were no vitals filed for this visit.  Visit Diagnosis:  General weakness  Bilateral knee pain  Bilateral hip pain  Abnormal posture      Subjective Assessment - 05/10/15 1150    Subjective "I dropped down the predisone to 1/2 and can tell I am a little more sore today"   Currently in Pain? Yes   Pain Score 3    Pain Location Knee   Pain Orientation Right   Pain Descriptors / Indicators Aching;Tightness   Pain Type Chronic pain   Pain Onset More than a month ago   Pain Frequency Intermittent   Aggravating Factors  stairs   Pain Relieving Factors stretching   Pain Score 3   Pain Location Hip   Pain Orientation Right   Pain Descriptors / Indicators Aching   Pain Type Chronic pain   Pain Onset More than a month ago   Pain Frequency Intermittent   Aggravating Factors  stairs   Pain Relieving Factors stretching            [facility] PT Assessment - 05/10/15 0001    Strength   Right Hip Flexion 4/5   Right Hip Extension 4-/5   Right Hip ABduction 4+/5   Right Hip ADduction 5/5   Left Hip Flexion 4/5   Left Hip Extension 4-/5   Left Hip ABduction 4+/5   Left Hip ADduction 4+/5    Right Knee Flexion 5/5   Right Knee Extension 5/5   Left Knee Flexion 5/5   Left Knee Extension 5/5                     [facility] Adult PT Treatment/Exercise - 05/10/15 0001    Balance   Balance Assessed Yes   Static Standing Balance   Single Leg Stance - Right Leg --  2 x 10 sec   Single Leg Stance - Left Leg --  2 x 10 sec   Tandem Stance - Right Leg --  2 x30 sec   Tandem Stance - Left Leg --  2 x 30 sec   Lumbar Exercises: Stretches   Passive Hamstring Stretch 2 reps;30 seconds   Single Knee to Chest Stretch 2 reps;30 seconds   Quad Stretch 2 reps;30 seconds   Piriformis Stretch 2 reps;30 seconds   Lumbar Exercises: Aerobic   Stationary Bike Nu Step L8 x 5 min  goal 300 steps:    Lumbar Exercises: Supine   Bridge 10 reps  3 sets with 4 x alt kickouts with band.   Straight Leg Raise 15 reps;1 second   Knee/Hip Exercises: Standing  Heel Raises 3 sets;20 reps   Forward Step Up Both;2 sets;10 reps;Hand Hold: 0;Step Height: 6"                PT Education - 05/10/15 1236    Education provided Yes   Education Details tandem balance in corner for safety   Person(s) Educated Patient   Methods Explanation   Comprehension Verbalized understanding;Returned demonstration          PT Short Term Goals - 04/05/15 1301    PT SHORT TERM GOAL #1   Title pt will be I with basic HEP (04/05/2015)   Baseline no questions today   Time 3   Period Weeks   Status Achieved           PT Long Term Goals - 05/05/15 1240    PT LONG TERM GOAL #1   Title pt will decrease pain to <3/10 following walking/ standing for >30 minutes to assist with ADL's (04/26/2015)   Time 6   Period Weeks   Status Achieved   PT LONG TERM GOAL #2   Title pt will increase FOTO score by > 10 points to help functional capacity (04/26/2015)   Time 6   Period Weeks   Status On-going   PT LONG TERM GOAL #3   Title pt will increase Bil lower extremity strenght to > 4+/5 to assist with  prolonged walking and lifting for exercise(04/26/2015)   Time 6   Period Weeks   Status Partially Met   PT LONG TERM GOAL #4   Title pt will be able to verbalize and demonstrate techniques to reduce risk of bil hip/ knee reinjury by postural awareness, lifting and carrying mechanics, and HEP. (04/26/2015)   Time 6   Period Weeks   Status Achieved               Plan - 05/10/15 1236    Clinical Impression Statement Victoria Wallace continues to make progress with increased strength and improved funciton with self report of walking and standing for longer periods of time without as much difficulty. she tolerated exercises well today but reported  only feeling like the muscles were getting worked, and exhibited increased postural sway with tandem balance with eyes closed. progressed strengthening today to increased time and reps to help with endurance.    PT Next Visit Plan  Stretching and strengthening of knee musculature, STM for glute/piriformis tightness/pain, modalities for pain PRN, gait training, functional sit to stand strengthening, pilates with [name]   PT Home Exercise Plan tandem balance in corner for safety   Consulted and Agree with Plan of Care Patient        Problem List There are no active problems to display for this patient.  Starr Lake PT, DPT, LAT, ATC  05/10/2015  12:41 PM    [facility] [address] [place], [place], [address] Phone: [phone]   Fax:  [phone]

## 2015-05-10 NOTE — Patient Instructions (Signed)
   Deb Loudin PT, DPT, LAT, ATC  [facility] Phone: [phone]     

## 2015-05-12 ENCOUNTER — Ambulatory Visit: Payer: Medicare Other | Admitting: Physical Therapy

## 2015-05-12 DIAGNOSIS — M25561 Pain in right knee: Secondary | ICD-10-CM

## 2015-05-12 DIAGNOSIS — M25551 Pain in right hip: Secondary | ICD-10-CM

## 2015-05-12 DIAGNOSIS — M25562 Pain in left knee: Secondary | ICD-10-CM

## 2015-05-12 DIAGNOSIS — M25552 Pain in left hip: Secondary | ICD-10-CM

## 2015-05-12 DIAGNOSIS — R293 Abnormal posture: Secondary | ICD-10-CM

## 2015-05-12 DIAGNOSIS — R531 Weakness: Secondary | ICD-10-CM

## 2015-05-12 NOTE — Therapy (Signed)
[place], [place], [address] Phone: [phone]   Fax:  [phone]  Physical Therapy Treatment  Patient Details  Name: GLORIA RICARDO MRN: [id number] Date of Birth: 03/17/1951 Referring Provider:  Levin Erp, MD  Encounter Date: 05/12/2015      PT End of Session - 05/12/15 1233    Visit Number 15   Number of Visits 18   Date for PT Re-Evaluation 05/28/15   PT Start Time 5809   PT Stop Time 1230   PT Time Calculation (min) 45 min   Activity Tolerance Patient tolerated treatment well   Behavior During Therapy [facility] for tasks assessed/performed      Past Medical History  Diagnosis Date  . Hypertension     Past Surgical History  Procedure Laterality Date  . Lumbar laminectomy Bilateral 2002    There were no vitals filed for this visit.  Visit Diagnosis:  General weakness  Bilateral knee pain  Bilateral hip pain  Abnormal posture      Subjective Assessment - 05/12/15 1156    Subjective " I can tell that I am a little sore but it is a good soreness since the last visit"   Currently in Pain? Yes   Pain Score 2    Pain Location Knee   Pain Orientation Right   Pain Descriptors / Indicators Aching;Tightness   Pain Type Chronic pain   Pain Onset More than a month ago   Pain Frequency Intermittent   Pain Score --  1.5/10   Pain Location Hip   Pain Orientation Right   Pain Descriptors / Indicators Aching   Pain Type Chronic pain   Pain Onset More than a month ago   Pain Frequency Intermittent                         [facility] Adult PT Treatment/Exercise - 05/12/15 1159    Ambulation/Gait   Ambulation/Gait Yes   Static Standing Balance   Single Leg Stance - Right Leg --  2x 10 sec   Single Leg Stance - Left Leg --  2 x 15 sec   Tandem Stance - Right Leg --  2 x 30 sec, 1 x with eyes open/closed   Tandem Stance - Left Leg --  2 x 30 sec, 1 x with eyes open/closed    Lumbar Exercises: Stretches   Passive Hamstring Stretch 2 reps;30 seconds   Single Knee to Chest Stretch 2 reps;30 seconds   Quad Stretch 2 reps;30 seconds   Piriformis Stretch 2 reps;30 seconds   Lumbar Exercises: Aerobic   Stationary Bike Nu Step L6 x 8 min  goal 850   Lumbar Exercises: Seated   Sit to Stand 15 reps   Sit to Stand Limitations ball between knees for proper form   Lumbar Exercises: Supine   Clam 15 reps  2 x with green band   Bridge 15 reps  with alt kickouts, while maintaining hip abduction   Straight Leg Raise 15 reps;1 second  5#, 2 sets                PT Education - 05/12/15 1232    Education provided No          PT Short Term Goals - 04/05/15 1301    PT SHORT TERM GOAL #1   Title pt will be I with basic HEP (04/05/2015)   Baseline no questions today   Time  3   Period Weeks   Status Achieved           PT Long Term Goals - 05/05/15 1240    PT LONG TERM GOAL #1   Title pt will decrease pain to <3/10 following walking/ standing for >30 minutes to assist with ADL's (04/26/2015)   Time 6   Period Weeks   Status Achieved   PT LONG TERM GOAL #2   Title pt will increase FOTO score by > 10 points to help functional capacity (04/26/2015)   Time 6   Period Weeks   Status On-going   PT LONG TERM GOAL #3   Title pt will increase Bil lower extremity strenght to > 4+/5 to assist with prolonged walking and lifting for exercise(04/26/2015)   Time 6   Period Weeks   Status Partially Met   PT LONG TERM GOAL #4   Title pt will be able to verbalize and demonstrate techniques to reduce risk of bil hip/ knee reinjury by postural awareness, lifting and carrying mechanics, and HEP. (04/26/2015)   Time 6   Period Weeks   Status Achieved               Plan - 05/12/15 1233    Clinical Impression Statement Tashena presents to therapy today with report that she is feeling a little sore since the last visit but that it is a good soreness. She tolerated  all exercises well today and was able to perform static tandem balance with eyes open/closed with minimal postural sway.    PT Next Visit Plan  Stretching and strengthening of knee musculature, pilates with [name]        Problem List There are no active problems to display for this patient.  Starr Lake PT, DPT, LAT, ATC  05/12/2015  12:38 PM    [facility] [address] [place], [place], [address] Phone: [phone]   Fax:  [phone]

## 2015-05-19 ENCOUNTER — Ambulatory Visit: Payer: Medicare Other | Attending: Internal Medicine | Admitting: Physical Therapy

## 2015-05-19 DIAGNOSIS — M25561 Pain in right knee: Secondary | ICD-10-CM | POA: Diagnosis present

## 2015-05-19 DIAGNOSIS — R293 Abnormal posture: Secondary | ICD-10-CM | POA: Insufficient documentation

## 2015-05-19 DIAGNOSIS — M25562 Pain in left knee: Secondary | ICD-10-CM | POA: Diagnosis present

## 2015-05-19 DIAGNOSIS — M25551 Pain in right hip: Secondary | ICD-10-CM | POA: Diagnosis present

## 2015-05-19 DIAGNOSIS — M25552 Pain in left hip: Secondary | ICD-10-CM | POA: Diagnosis present

## 2015-05-19 DIAGNOSIS — R531 Weakness: Secondary | ICD-10-CM | POA: Diagnosis present

## 2015-05-19 NOTE — Therapy (Signed)
[place], [place], [address] Phone: [phone]   Fax:  [phone]  Physical Therapy Treatment  Patient Details  Name: Victoria Wallace MRN: [id number] Date of Birth: May 31, 1951 Referring Provider:  Levin Erp, MD  Encounter Date: 05/19/2015      PT End of Session - 05/19/15 1314    Visit Number 16   Number of Visits 18   Date for PT Re-Evaluation 05/28/15   PT Start Time 1300   PT Stop Time 0350   PT Time Calculation (min) 47 min      Past Medical History  Diagnosis Date  . Hypertension     Past Surgical History  Procedure Laterality Date  . Lumbar laminectomy Bilateral 2002    There were no vitals filed for this visit.  Visit Diagnosis:  General weakness  Bilateral knee pain  Bilateral hip pain  Abnormal posture      Subjective Assessment - 05/19/15 1312    Subjective I am not on my game today, very sore" At rest, 2/10.  With activity 5/10, L hip.       Pilates Reformer used for LE/core strength, postural strength, lumbopelvic disassociation and core control.  Exercises included: Footwork  2 Red 1 blue with various hip, foot positions, single leg x 10 each, 2 sets Bridging 1 set with all springs, then 1 set with 2 R ,1B.  Needs to slow her spinal articulation and use breath, abdominals to articulate.   Feet in Straps 1 Red 1 Yellow: Arcs parallel, Hip ER, squats   Clam each side x 20 with correction for form, hip abd x 10 each      PT Short Term Goals - 04/05/15 1301    PT SHORT TERM GOAL #1   Title pt will be I with basic HEP (04/05/2015)   Baseline no questions today   Time 3   Period Weeks   Status Achieved           PT Long Term Goals - 05/19/15 1314    PT LONG TERM GOAL #1   Title pt will decrease pain to <3/10 following walking/ standing for >30 minutes to assist with ADL's (04/26/2015)   Status Achieved   PT LONG TERM GOAL #2   Title pt will increase FOTO  score by > 10 points to help functional capacity (04/26/2015)   Status On-going   PT LONG TERM GOAL #3   Title pt will increase Bil lower extremity strenght to > 4+/5 to assist with prolonged walking and lifting for exercise(04/26/2015)   Status Partially Met   PT LONG TERM GOAL #4   Title pt will be able to verbalize and demonstrate techniques to reduce risk of bil hip/ knee reinjury by postural awareness, lifting and carrying mechanics, and HEP. (04/26/2015)   Status Achieved               Plan - 05/19/15 1528    Clinical Impression Statement Patient was introduced to Pilates equipment and did well, understands concepts of stability and had good carry-over.  She lacks core control with bridging, weak glute med.  No further goals met.    PT Next Visit Plan pilates    PT Home Exercise Plan review rolling bridge, add clam   Consulted and Agree with Plan of Care Patient        Problem List There are no active problems to display for this patient.   Quadre Bristol 05/19/2015, 3:35 PM  [place], [place], [address] Phone: [phone]   Fax:  [phone]  Raeford Razor, PT 05/19/2015 3:35 PM Phone: [phone] Fax: [phone]

## 2015-05-24 ENCOUNTER — Ambulatory Visit: Payer: Medicare Other | Admitting: Physical Therapy

## 2015-05-24 DIAGNOSIS — R531 Weakness: Secondary | ICD-10-CM

## 2015-05-24 DIAGNOSIS — M25551 Pain in right hip: Secondary | ICD-10-CM

## 2015-05-24 DIAGNOSIS — R293 Abnormal posture: Secondary | ICD-10-CM

## 2015-05-24 DIAGNOSIS — M25552 Pain in left hip: Secondary | ICD-10-CM

## 2015-05-24 DIAGNOSIS — M25561 Pain in right knee: Secondary | ICD-10-CM

## 2015-05-24 DIAGNOSIS — M25562 Pain in left knee: Secondary | ICD-10-CM

## 2015-05-24 NOTE — Therapy (Signed)
[place], [place], [address] Phone: [phone]   Fax:  [phone]  Physical Therapy Treatment  Patient Details  Name: Victoria Wallace MRN: [id number] Date of Birth: [birth year] Referring Provider:  Levin Erp, MD  Encounter Date: 05/24/2015      PT End of Session - 05/24/15 1157    Visit Number 17   Number of Visits 18   Date for PT Re-Evaluation 05/28/15   PT Start Time 80      Past Medical History  Diagnosis Date  . Hypertension     Past Surgical History  Procedure Laterality Date  . Lumbar laminectomy Bilateral 2002    There were no vitals filed for this visit.  Visit Diagnosis:  No diagnosis found.      Subjective Assessment - 05/24/15 1150    Subjective Achy legs, a little pain, does not know why.  Pt reports is stepping down from prednisone so that may be it.  Wants to observe a Reformer class at [employer]. DC Wed.    Currently in Pain? Yes   Pain Score 4    Pain Location Leg   Pain Orientation Right;Left;Proximal   Pain Descriptors / Indicators Aching;Sore   Pain Type Chronic pain   Pain Onset More than a month ago   Pain Frequency Intermittent   Pain Relieving Factors gentle ex          Pilates Reformer used for LE/core strength, postural strength, lumbopelvic disassociation and core control.  Exercises included:  Bridging 2 Red 1 Green x 10 with cues for articulation, added press out x 8  Supine Arms/Abs 1 Red 1 yellow x 10, added chest lift for 5 reps (Arcs, circles) Feet in Straps 1 red for hamstring stretch  Arcs with magic circle x 10, 1 red, 1 yello Sidelying hip work, Clam x 20  2 red single leg press, 2 x10 (1 x neutral, 1 x hip ER) Pt needs cues to maintain neutral spine and pelvis with hip flexion/ext   Pt reports feeling well supported on mat, no pain increase.        PT Short Term Goals - 04/05/15 1301    PT SHORT TERM GOAL #1   Title pt will be I  with basic HEP (04/05/2015)   Baseline no questions today   Time 3   Period Weeks   Status Achieved           PT Long Term Goals - 05/19/15 1314    PT LONG TERM GOAL #1   Title pt will decrease pain to <3/10 following walking/ standing for >30 minutes to assist with ADL's (04/26/2015)   Status Achieved   PT LONG TERM GOAL #2   Title pt will increase FOTO score by > 10 points to help functional capacity (04/26/2015)   Status On-going   PT LONG TERM GOAL #3   Title pt will increase Bil lower extremity strenght to > 4+/5 to assist with prolonged walking and lifting for exercise(04/26/2015)   Status Partially Met   PT LONG TERM GOAL #4   Title pt will be able to verbalize and demonstrate techniques to reduce risk of bil hip/ knee reinjury by postural awareness, lifting and carrying mechanics, and HEP. (04/26/2015)   Status Achieved               Plan - 05/24/15 1158    Clinical Impression Statement Patient will be DC after next visit. She cont to  have muscle soreness and was educated on soreness vs pain today. She is a good candidate for post -rehab therapeutic Pilates.         Raeford Razor, PT 05/24/2015 12:48 PM Phone: [phone] Fax: [phone]   Problem List There are no active problems to display for this patient.   Victoria Wallace 05/24/2015, 12:21 PM  [facility] [address] [place], [place], [address] Phone: [phone]   Fax:  [phone]

## 2015-05-26 ENCOUNTER — Ambulatory Visit: Payer: Medicare Other | Admitting: Physical Therapy

## 2015-05-26 DIAGNOSIS — M25552 Pain in left hip: Secondary | ICD-10-CM

## 2015-05-26 DIAGNOSIS — M25562 Pain in left knee: Secondary | ICD-10-CM

## 2015-05-26 DIAGNOSIS — R531 Weakness: Secondary | ICD-10-CM

## 2015-05-26 DIAGNOSIS — R293 Abnormal posture: Secondary | ICD-10-CM

## 2015-05-26 DIAGNOSIS — M25561 Pain in right knee: Secondary | ICD-10-CM

## 2015-05-26 DIAGNOSIS — M25551 Pain in right hip: Secondary | ICD-10-CM

## 2015-05-26 NOTE — Therapy (Signed)
[place], [place], [address] Phone: [phone]   Fax:  [phone]  Physical Therapy Treatment  Patient Details  Name: Victoria Wallace MRN: [id number] Date of Birth: 22-Jan-1951 Referring Provider:  Levin Erp, MD  Encounter Date: 05/26/2015      PT End of Session - 05/26/15 1229    Visit Number 18   Number of Visits 18   Date for PT Re-Evaluation 05/28/15   PT Start Time 6270   PT Stop Time 1230   PT Time Calculation (min) 45 min   Activity Tolerance Patient tolerated treatment well   Behavior During Therapy [facility] for tasks assessed/performed      Past Medical History  Diagnosis Date  . Hypertension     Past Surgical History  Procedure Laterality Date  . Lumbar laminectomy Bilateral 2002    There were no vitals filed for this visit.  Visit Diagnosis:  General weakness  Bilateral knee pain  Bilateral hip pain  Abnormal posture      Subjective Assessment - 05/26/15 1149    Subjective "I am doing better and can tell that physical therapy really has helped me so much"   Currently in Pain? Yes   Pain Score 3    Pain Location Leg   Pain Orientation Right;Left;Proximal   Pain Descriptors / Indicators Aching;Sore   Pain Type Chronic pain   Pain Onset More than a month ago   Pain Frequency Intermittent   Pain Score 2   Pain Location Hip   Pain Orientation Right   Pain Descriptors / Indicators Aching            [facility] PT Assessment - 05/26/15 0001    Observation/Other Assessments   Focus on Therapeutic Outcomes (FOTO)  36% limitation   Strength   Right Hip Flexion 4+/5   Right Hip Extension 4-/5   Right Hip ABduction 4+/5  4-/5 with glute med   Right Hip ADduction 5/5   Left Hip Flexion 5/5   Left Hip Extension 4-/5   Left Hip ABduction 4+/5  4-/5 glute med   Right Knee Flexion 5/5   Right Knee Extension 5/5   Left Knee Flexion 5/5   Left Knee Extension 5/5                              PT Education - 05/26/15 1229    Education provided Yes   Education Details advanced HEP, and exercises for personal home gym routine   Person(s) Educated Patient   Methods Explanation   Comprehension Verbalized understanding          PT Short Term Goals - 04/05/15 1301    PT SHORT TERM GOAL #1   Title pt will be I with basic HEP (04/05/2015)   Baseline no questions today   Time 3   Period Weeks   Status Achieved           PT Long Term Goals - 05/26/15 1209    PT LONG TERM GOAL #1   Title pt will decrease pain to <3/10 following walking/ standing for >30 minutes to assist with ADL's (04/26/2015)   Time 6   Period Weeks   Status Achieved   PT LONG TERM GOAL #2   Title pt will increase FOTO score by > 10 points to help functional capacity (04/26/2015)   Time 6   Period Weeks   Status Achieved  PT LONG TERM GOAL #3   Title pt will increase Bil lower extremity strenght to > 4+/5 to assist with prolonged walking and lifting for exercise(04/26/2015)   Time 6   Period Weeks   Status Partially Met   PT LONG TERM GOAL #4   Title pt will be able to verbalize and demonstrate techniques to reduce risk of bil hip/ knee reinjury by postural awareness, lifting and carrying mechanics, and HEP. (04/26/2015)   Time 6   Period Weeks   Status Achieved               Plan - 2015-06-03 1230    Clinical Impression Statement Cathern presents to therapy today reporting that she is doing better and is motivated to continue working and will take up a personal routine at the gym with a Physiological scientist. she met all STG and LTG.She has increased strength in the hip but has some weakness in her glute musculature. She is able to maintain her HEP independently and no longer requires skilled physical therapy.    PT Next Visit Plan review full HEP and DC, FOTO, GCODE, DC today   PT Home Exercise Plan advanced HEP   Consulted and Agree with Plan of Care  Patient          G-Codes - 03-Jun-2015 1210    Functional Assessment Tool Used FOTO 36%   Functional Limitation Mobility: Walking and moving around   Mobility: Walking and Moving Around Goal Status [phone]) At least 20 percent but less than 40 percent impaired, limited or restricted   Mobility: Walking and Moving Around Discharge Status [phone]) At least 20 percent but less than 40 percent impaired, limited or restricted      Problem List There are no active problems to display for this patient.                      PHYSICAL THERAPY DISCHARGE SUMMARY  Visits from Start of Care: 18  Current functional level related to goals / functional outcomes: FOTO 36% limited   Remaining deficits: Muscle weakness of bil glutes   Education / Equipment: HEP goals  Plan: Patient agrees to discharge.  Patient goals were met. Patient is being discharged due to meeting the stated rehab goals.  ?????        Starr Lake PT, DPT, LAT, ATC  06-03-2015  12:33 PM     [facility] [address] [place], [place], [address] Phone: [phone]   Fax:  [phone]

## 2015-05-26 NOTE — Patient Instructions (Signed)
   Tykwon Fera PT, DPT, LAT, ATC  [facility] Phone: [phone]     

## 2015-05-31 ENCOUNTER — Ambulatory Visit: Payer: Medicare Other | Admitting: Physical Therapy

## 2015-06-02 ENCOUNTER — Ambulatory Visit: Payer: Medicare Other | Admitting: Physical Therapy

## 2015-06-07 ENCOUNTER — Encounter: Payer: Medicare Other | Admitting: Physical Therapy

## 2015-06-09 ENCOUNTER — Other Ambulatory Visit: Payer: Self-pay

## 2015-06-09 ENCOUNTER — Encounter: Payer: Medicare Other | Admitting: Physical Therapy

## 2015-06-09 DIAGNOSIS — Z1231 Encounter for screening mammogram for malignant neoplasm of breast: Secondary | ICD-10-CM

## 2015-06-14 ENCOUNTER — Ambulatory Visit: Payer: Medicare Other | Admitting: Physical Therapy

## 2015-06-16 ENCOUNTER — Encounter: Payer: Medicare Other | Admitting: Physical Therapy

## 2015-07-05 ENCOUNTER — Ambulatory Visit
Admission: RE | Admit: 2015-07-05 | Discharge: 2015-07-05 | Disposition: A | Discharge status: Home / Self Care | Payer: Medicare Other | Source: Ambulatory Visit

## 2015-07-05 DIAGNOSIS — Z1231 Encounter for screening mammogram for malignant neoplasm of breast: Secondary | ICD-10-CM

## 2016-05-26 ENCOUNTER — Other Ambulatory Visit: Payer: Self-pay | Admitting: Internal Medicine

## 2016-05-26 DIAGNOSIS — Z1231 Encounter for screening mammogram for malignant neoplasm of breast: Secondary | ICD-10-CM

## 2016-07-10 ENCOUNTER — Ambulatory Visit
Admission: RE | Admit: 2016-07-10 | Discharge: 2016-07-10 | Disposition: A | Discharge status: Home / Self Care | Payer: Medicare Other | Source: Ambulatory Visit | Attending: Internal Medicine | Admitting: Internal Medicine

## 2016-07-10 DIAGNOSIS — Z1231 Encounter for screening mammogram for malignant neoplasm of breast: Secondary | ICD-10-CM

## 2017-05-18 DIAGNOSIS — Z1159 Encounter for screening for other viral diseases: Secondary | ICD-10-CM

## 2017-05-18 DIAGNOSIS — Z789 Other specified health status: Secondary | ICD-10-CM

## 2017-05-18 HISTORY — DX: Other specified health status: Z78.9

## 2017-05-18 HISTORY — DX: Encounter for screening for other viral diseases: Z11.59

## 2017-06-07 ENCOUNTER — Other Ambulatory Visit: Payer: Self-pay | Admitting: Internal Medicine

## 2017-06-07 DIAGNOSIS — Z1231 Encounter for screening mammogram for malignant neoplasm of breast: Secondary | ICD-10-CM

## 2017-07-11 ENCOUNTER — Encounter (INDEPENDENT_AMBULATORY_CARE_PROVIDER_SITE_OTHER): Payer: Self-pay

## 2017-07-11 ENCOUNTER — Ambulatory Visit
Admission: RE | Admit: 2017-07-11 | Discharge: 2017-07-11 | Disposition: A | Discharge status: Home / Self Care | Payer: Medicare Other | Source: Ambulatory Visit | Attending: Internal Medicine | Admitting: Internal Medicine

## 2017-07-11 DIAGNOSIS — Z1231 Encounter for screening mammogram for malignant neoplasm of breast: Secondary | ICD-10-CM

## 2017-08-13 DIAGNOSIS — Z23 Encounter for immunization: Secondary | ICD-10-CM

## 2017-08-13 HISTORY — DX: Encounter for immunization: Z23

## 2017-08-14 NOTE — Patient Instructions (Signed)
Victoria Wallace  08/14/2017   Your procedure is scheduled on: 08/28/17  Report to [facility] Main  Entrance   Report to admitting at      [phone]   Call this number if you have problems the morning of surgery  [phone]   Remember: ONLY 1 PERSON MAY GO WITH YOU TO SHORT STAY TO GET  READY MORNING OF YOUR SURGERY.  Do not eat food or drink liquids :After Midnight.     Take these medicines the morning of surgery with A SIP OF WATER: metoprolol                                You may not have any metal on your body including hair pins and              piercings  Do not wear jewelry, make-up, lotions, powders or perfumes, deodorant             Do not wear nail polish.  Do not shave  48 hours prior to surgery.   .   Do not bring valuables to the hospital. [place].  Contacts, dentures or bridgework may not be worn into surgery.  Leave suitcase in the car. After surgery it may be brought to your room.                 Please read over the following fact sheets you were given: _____________________________________________________________________          [facility] - Preparing for Surgery Before surgery, you can play an important role.  Because skin is not sterile, your skin needs to be as free of germs as possible.  You can reduce the number of germs on your skin by washing with CHG (chlorahexidine gluconate) soap before surgery.  CHG is an antiseptic cleaner which kills germs and bonds with the skin to continue killing germs even after washing. Please DO NOT use if you have an allergy to CHG or antibacterial soaps.  If your skin becomes reddened/irritated stop using the CHG and inform your nurse when you arrive at Short Stay. Do not shave (including legs and underarms) for at least 48 hours prior to the first CHG shower.  You may shave your face/neck. Please follow these instructions carefully:  1.  Shower with  CHG Soap the night before surgery and the  morning of Surgery.  2.  If you choose to wash your hair, wash your hair first as usual with your  normal  shampoo.  3.  After you shampoo, rinse your hair and body thoroughly to remove the  shampoo.                           4.  Use CHG as you would any other liquid soap.  You can apply chg directly  to the skin and wash                       Gently with a scrungie or clean washcloth.  5.  Apply the CHG Soap to your body ONLY FROM THE NECK DOWN.   Do not use on face/ open  Wound or open sores. Avoid contact with eyes, ears mouth and genitals (private parts).                       Wash face,  Genitals (private parts) with your normal soap.             6.  Wash thoroughly, paying special attention to the area where your surgery  will be performed.  7.  Thoroughly rinse your body with warm water from the neck down.  8.  DO NOT shower/wash with your normal soap after using and rinsing off  the CHG Soap.                9.  Pat yourself dry with a clean towel.            10.  Wear clean pajamas.            11.  Place clean sheets on your bed the night of your first shower and do not  sleep with pets. Day of Surgery : Do not apply any lotions/deodorants the morning of surgery.  Please wear clean clothes to the hospital/surgery center.  FAILURE TO FOLLOW THESE INSTRUCTIONS MAY RESULT IN THE CANCELLATION OF YOUR SURGERY PATIENT SIGNATURE_________________________________  NURSE SIGNATURE__________________________________  ________________________________________________________________________  WHAT IS A BLOOD TRANSFUSION? Blood Transfusion Information  A transfusion is the replacement of blood or some of its parts. Blood is made up of multiple cells which provide different functions.  Red blood cells carry oxygen and are used for blood loss replacement.  White blood cells fight against infection.  Platelets control  bleeding.  Plasma helps clot blood.  Other blood products are available for specialized needs, such as hemophilia or other clotting disorders. BEFORE THE TRANSFUSION  Who gives blood for transfusions?   Healthy volunteers who are fully evaluated to make sure their blood is safe. This is blood bank blood. Transfusion therapy is the safest it has ever been in the practice of medicine. Before blood is taken from a donor, a complete history is taken to make sure that person has no history of diseases nor engages in risky social behavior (examples are intravenous drug use or sexual activity with multiple partners). The donor's travel history is screened to minimize risk of transmitting infections, such as malaria. The donated blood is tested for signs of infectious diseases, such as HIV and hepatitis. The blood is then tested to be sure it is compatible with you in order to minimize the chance of a transfusion reaction. If you or a relative donates blood, this is often done in anticipation of surgery and is not appropriate for emergency situations. It takes many days to process the donated blood. RISKS AND COMPLICATIONS Although transfusion therapy is very safe and saves many lives, the main dangers of transfusion include:   Getting an infectious disease.  Developing a transfusion reaction. This is an allergic reaction to something in the blood you were given. Every precaution is taken to prevent this. The decision to have a blood transfusion has been considered carefully by your caregiver before blood is given. Blood is not given unless the benefits outweigh the risks. AFTER THE TRANSFUSION  Right after receiving a blood transfusion, you will usually feel much better and more energetic. This is especially true if your red blood cells have gotten low (anemic). The transfusion raises the level of the red blood cells which carry oxygen, and this usually causes an energy increase.  The  nurse  administering the transfusion will monitor you carefully for complications. HOME CARE INSTRUCTIONS  No special instructions are needed after a transfusion. You may find your energy is better. Speak with your caregiver about any limitations on activity for underlying diseases you may have. SEEK MEDICAL CARE IF:   Your condition is not improving after your transfusion.  You develop redness or irritation at the intravenous (IV) site. SEEK IMMEDIATE MEDICAL CARE IF:  Any of the following symptoms occur over the next 12 hours:  Shaking chills.  You have a temperature by mouth above 102 F (38.9 C), not controlled by medicine.  Chest, back, or muscle pain.  People around you feel you are not acting correctly or are confused.  Shortness of breath or difficulty breathing.  Dizziness and fainting.  You get a rash or develop hives.  You have a decrease in urine output.  Your urine turns a dark color or changes to pink, red, or brown. Any of the following symptoms occur over the next 10 days:  You have a temperature by mouth above 102 F (38.9 C), not controlled by medicine.  Shortness of breath.  Weakness after normal activity.  The white part of the eye turns yellow (jaundice).  You have a decrease in the amount of urine or are urinating less often.  Your urine turns a dark color or changes to pink, red, or brown. Document Released: 12/01/2000 Document Revised: 02/26/2012 Document Reviewed: 07/20/2008 ExitCare Patient Information 2014 [place].  _______________________________________________________________________  Incentive Spirometer  An incentive spirometer is a tool that can help keep your lungs clear and active. This tool measures how well you are filling your lungs with each breath. Taking long deep breaths may help reverse or decrease the chance of developing breathing (pulmonary) problems (especially infection) following:  A long period of time when you are  unable to move or be active. BEFORE THE PROCEDURE   If the spirometer includes an indicator to show your best effort, your nurse or respiratory therapist will set it to a desired goal.  If possible, sit up straight or lean slightly forward. Try not to slouch.  Hold the incentive spirometer in an upright position. INSTRUCTIONS FOR USE  1. Sit on the edge of your bed if possible, or sit up as far as you can in bed or on a chair. 2. Hold the incentive spirometer in an upright position. 3. Breathe out normally. 4. Place the mouthpiece in your mouth and seal your lips tightly around it. 5. Breathe in slowly and as deeply as possible, raising the piston or the ball toward the top of the column. 6. Hold your breath for 3-5 seconds or for as long as possible. Allow the piston or ball to fall to the bottom of the column. 7. Remove the mouthpiece from your mouth and breathe out normally. 8. Rest for a few seconds and repeat Steps 1 through 7 at least 10 times every 1-2 hours when you are awake. Take your time and take a few normal breaths between deep breaths. 9. The spirometer may include an indicator to show your best effort. Use the indicator as a goal to work toward during each repetition. 10. After each set of 10 deep breaths, practice coughing to be sure your lungs are clear. If you have an incision (the cut made at the time of surgery), support your incision when coughing by placing a pillow or rolled up towels firmly against it. Once you are able to get  out of bed, walk around indoors and cough well. You may stop using the incentive spirometer when instructed by your caregiver.  RISKS AND COMPLICATIONS  Take your time so you do not get dizzy or light-headed.  If you are in pain, you may need to take or ask for pain medication before doing incentive spirometry. It is harder to take a deep breath if you are having pain. AFTER USE  Rest and breathe slowly and easily.  It can be helpful to  keep track of a log of your progress. Your caregiver can provide you with a simple table to help with this. If you are using the spirometer at home, follow these instructions: [place] IF:   You are having difficultly using the spirometer.  You have trouble using the spirometer as often as instructed.  Your pain medication is not giving enough relief while using the spirometer.  You develop fever of 100.5 F (38.1 C) or higher. SEEK IMMEDIATE MEDICAL CARE IF:   You cough up bloody sputum that had not been present before.  You develop fever of 102 F (38.9 C) or greater.  You develop worsening pain at or near the incision site. MAKE SURE YOU:   Understand these instructions.  Will watch your condition.  Will get help right away if you are not doing well or get worse. Document Released: 04/16/2007 Document Revised: 02/26/2012 Document Reviewed: 06/17/2007 [facility] Patient Information 2014 [place], [place].   ________________________________________________________________________

## 2017-08-15 NOTE — H&P (Signed)
TOTAL KNEE ADMISSION H&P  Patient is being admitted for right total knee arthroplasty.  Subjective:  Chief Complaint:   Right knee primary OA / pain  HPI: Victoria Wallace, 66 y.o. female, has a history of pain and functional disability in the right knee due to arthritis and has failed non-surgical conservative treatments for greater than 12 weeks to includeNSAID's and/or analgesics and activity modification.  Onset of symptoms was gradual, starting >10 years ago with gradually worsening course since that time. The patient noted no past surgery on the right knee(s).  Patient currently rates pain in the right knee(s) at 7 out of 10 with activity. Patient has night pain, worsening of pain with activity and weight bearing, pain that interferes with activities of daily living, pain with passive range of motion, crepitus and joint swelling.  Patient has evidence of periarticular osteophytes and joint space narrowing by imaging studies.  There is no active infection.   Risks, benefits and expectations were discussed with the patient.  Risks including but not limited to the risk of anesthesia, blood clots, nerve damage, blood vessel damage, failure of the prosthesis, infection and up to and including death.  Patient understand the risks, benefits and expectations and wishes to proceed with surgery.   PCP: Levin Erp, MD  D/C Plans:       Home   Post-op Meds:       No Rx given  Tranexamic Acid:      To be given - IV   Decadron:      Is to be given  FYI:     ASA  Norco (ok per pt)  DME:    Rx given for - RW and 3-n-1  PT:     HHPT then OPPT (rx given)   Past Medical History:  Diagnosis Date  . Hypertension     Past Surgical History:  Procedure Laterality Date  . BREAST BIOPSY Right   . LUMBAR LAMINECTOMY Bilateral 2002    No prescriptions prior to admission.   Allergies  Allergen Reactions  . Tape     STICKY PART ON HEART MONITOR   . Codeine Rash  . Penicillins Swelling and Rash     PT WAS ABOUT 66 YEARS OLD  Has patient had a PCN reaction causing immediate rash, facial/tongue/throat swelling, SOB or lightheadedness with hypotension: Yes Has patient had a PCN reaction causing severe rash involving mucus membranes or skin necrosis: Unknown Has patient had a PCN reaction that required hospitalization: No Has patient had a PCN reaction occurring within the last 10 years: No If all of the above answers are "NO", then may proceed with Cephalosporin use.     Social History  Substance Use Topics  . Smoking status: Not on file  . Smokeless tobacco: Not on file  . Alcohol use Not on file    Family History  Problem Relation Age of Onset  . Breast cancer Maternal Aunt      Review of Systems  Constitutional: Negative.   HENT: Negative.   Eyes: Negative.   Respiratory: Negative.   Cardiovascular: Negative.   Gastrointestinal: Positive for constipation.  Genitourinary: Negative.   Musculoskeletal: Positive for back pain and joint pain.  Skin: Negative.   Neurological: Negative.   Endo/Heme/Allergies: Positive for environmental allergies.  Psychiatric/Behavioral: Negative.     Objective:  Physical Exam  Constitutional: She is oriented to person, place, and time. She appears well-developed.  HENT:  Head: Normocephalic.  Eyes: Pupils are equal, round, and  reactive to light.  Neck: Neck supple. No JVD present. No tracheal deviation present. No thyromegaly present.  Cardiovascular: Normal rate, regular rhythm and intact distal pulses.   Respiratory: Effort normal and breath sounds normal. No respiratory distress. She has no wheezes.  GI: Soft. There is no tenderness. There is no guarding.  Musculoskeletal:       Right knee: She exhibits decreased range of motion, swelling and bony tenderness. She exhibits no ecchymosis, no deformity, no laceration and no erythema. Tenderness found.  Lymphadenopathy:    She has no cervical adenopathy.  Neurological: She is alert  and oriented to person, place, and time.  Skin: Skin is warm and dry.  Psychiatric: She has a normal mood and affect.      Imaging Review Plain radiographs demonstrate severe degenerative joint disease of the right knee(s).  The bone quality appears to be good for age and reported activity level.  Assessment/Plan:  End stage arthritis, right knee   The patient history, physical examination, clinical judgment of the provider and imaging studies are consistent with end stage degenerative joint disease of the right knee(s) and total knee arthroplasty is deemed medically necessary. The treatment options including medical management, injection therapy arthroscopy and arthroplasty were discussed at length. The risks and benefits of total knee arthroplasty were presented and reviewed. The risks due to aseptic loosening, infection, stiffness, patella tracking problems, thromboembolic complications and other imponderables were discussed. The patient acknowledged the explanation, agreed to proceed with the plan and consent was signed. Patient is being admitted for inpatient treatment for surgery, pain control, PT, OT, prophylactic antibiotics, VTE prophylaxis, progressive ambulation and ADL's and discharge planning. The patient is planning to be discharged home.     West Pugh Skipper Dacosta   PA-C  08/15/2017, 12:31 PM

## 2017-08-17 ENCOUNTER — Encounter (HOSPITAL_COMMUNITY): Payer: Self-pay

## 2017-08-17 ENCOUNTER — Encounter (HOSPITAL_COMMUNITY)
Admission: RE | Admit: 2017-08-17 | Discharge: 2017-08-17 | Disposition: A | Discharge status: Home / Self Care | Payer: Medicare Other | Source: Ambulatory Visit | Attending: Orthopedic Surgery | Admitting: Orthopedic Surgery

## 2017-08-17 DIAGNOSIS — Z01818 Encounter for other preprocedural examination: Secondary | ICD-10-CM | POA: Insufficient documentation

## 2017-08-17 DIAGNOSIS — M1711 Unilateral primary osteoarthritis, right knee: Secondary | ICD-10-CM | POA: Insufficient documentation

## 2017-08-17 DIAGNOSIS — Z01812 Encounter for preprocedural laboratory examination: Secondary | ICD-10-CM | POA: Insufficient documentation

## 2017-08-17 DIAGNOSIS — Z0183 Encounter for blood typing: Secondary | ICD-10-CM | POA: Diagnosis not present

## 2017-08-17 HISTORY — DX: Asymptomatic microscopic hematuria: R31.21

## 2017-08-17 HISTORY — DX: Uterovaginal prolapse, unspecified: N81.4

## 2017-08-17 HISTORY — DX: Other constipation: K59.09

## 2017-08-17 HISTORY — DX: Unspecified osteoarthritis, unspecified site: M19.90

## 2017-08-17 LAB — CBC
HEMATOCRIT: 37.2 % (ref 36.0–46.0)
Hemoglobin: 12.5 g/dL (ref 12.0–15.0)
MCH: 31.9 pg (ref 26.0–34.0)
MCHC: 33.6 g/dL (ref 30.0–36.0)
MCV: 94.9 fL (ref 78.0–100.0)
Platelets: 239 10*3/uL (ref 150–400)
RBC: 3.92 MIL/uL (ref 3.87–5.11)
RDW: 13.2 % (ref 11.5–15.5)
WBC: 4.6 10*3/uL (ref 4.0–10.5)

## 2017-08-17 LAB — BASIC METABOLIC PANEL
ANION GAP: 7 (ref 5–15)
BUN: 21 mg/dL — ABNORMAL HIGH (ref 6–20)
CHLORIDE: 103 mmol/L (ref 101–111)
CO2: 28 mmol/L (ref 22–32)
CREATININE: 0.66 mg/dL (ref 0.44–1.00)
Calcium: 9.7 mg/dL (ref 8.9–10.3)
GFR calc non Af Amer: >60 mL/min (ref >60)
Glucose, Bld: 100 mg/dL — ABNORMAL HIGH (ref 65–99)
Potassium: 4.1 mmol/L (ref 3.5–5.1)
Sodium: 138 mmol/L (ref 135–145)

## 2017-08-17 LAB — ABO/RH: ABO/RH(D): O NEG

## 2017-08-17 LAB — TYPE AND SCREEN
ABO/RH(D): O NEG
ANTIBODY SCREEN: NEGATIVE

## 2017-08-17 LAB — SURGICAL PCR SCREEN
MRSA, PCR: NEGATIVE
STAPHYLOCOCCUS AUREUS: NEGATIVE

## 2017-08-17 NOTE — Progress Notes (Signed)
Clearance Dr. Nyoka Cowden 05/18/17 on chart

## 2017-08-28 ENCOUNTER — Encounter (HOSPITAL_COMMUNITY)
Admission: RE | Disposition: A | Discharge status: Home / Self Care | Payer: Self-pay | Source: Ambulatory Visit | Attending: Orthopedic Surgery

## 2017-08-28 ENCOUNTER — Encounter (HOSPITAL_COMMUNITY): Payer: Self-pay

## 2017-08-28 ENCOUNTER — Inpatient Hospital Stay (HOSPITAL_COMMUNITY): Payer: Medicare Other | Admitting: Registered Nurse

## 2017-08-28 ENCOUNTER — Observation Stay (HOSPITAL_COMMUNITY)
Admission: RE | Admit: 2017-08-28 | Discharge: 2017-08-29 | Disposition: A | Discharge status: Home / Self Care | Payer: Medicare Other | Source: Ambulatory Visit | Attending: Orthopedic Surgery | Admitting: Orthopedic Surgery

## 2017-08-28 DIAGNOSIS — I10 Essential (primary) hypertension: Secondary | ICD-10-CM | POA: Diagnosis not present

## 2017-08-28 DIAGNOSIS — Z96659 Presence of unspecified artificial knee joint: Secondary | ICD-10-CM

## 2017-08-28 DIAGNOSIS — M1711 Unilateral primary osteoarthritis, right knee: Principal | ICD-10-CM | POA: Insufficient documentation

## 2017-08-28 DIAGNOSIS — M25761 Osteophyte, right knee: Secondary | ICD-10-CM | POA: Diagnosis not present

## 2017-08-28 DIAGNOSIS — Z96651 Presence of right artificial knee joint: Secondary | ICD-10-CM

## 2017-08-28 HISTORY — PX: TOTAL KNEE ARTHROPLASTY: SHX125

## 2017-08-28 HISTORY — DX: Encounter for immunization: Z23

## 2017-08-28 HISTORY — DX: Abnormal results of other endocrine function studies: R94.7

## 2017-08-28 HISTORY — DX: Other specified health status: Z78.9

## 2017-08-28 HISTORY — DX: Encounter for screening for other viral diseases: Z11.59

## 2017-08-28 HISTORY — DX: Other specified postprocedural states: Z98.890

## 2017-08-28 SURGERY — ARTHROPLASTY, KNEE, TOTAL
Anesthesia: Spinal | Site: Knee | Laterality: Right

## 2017-08-28 MED ORDER — HYDROCODONE-ACETAMINOPHEN 7.5-325 MG PO TABS
1.0000 | ORAL_TABLET | ORAL | Status: DC
  Administered 2017-08-28: 2 via ORAL
  Administered 2017-08-28 (×2): 1 via ORAL
  Administered 2017-08-28 – 2017-08-29 (×3): 2 via ORAL
  Administered 2017-08-29: 1 via ORAL
  Filled 2017-08-28: qty 1
  Filled 2017-08-28 (×3): qty 2
  Filled 2017-08-28 (×2): qty 1
  Filled 2017-08-28: qty 2

## 2017-08-28 MED ORDER — MIDAZOLAM HCL 2 MG/2ML IJ SOLN
1.0000 mg | INTRAMUSCULAR | Status: DC | PRN
  Administered 2017-08-28: 2 mg via INTRAVENOUS

## 2017-08-28 MED ORDER — KETOROLAC TROMETHAMINE 30 MG/ML IJ SOLN
30.0000 mg | Freq: Once | INTRAMUSCULAR | Status: DC | PRN
Start: 2017-08-28 — End: 2017-08-28

## 2017-08-28 MED ORDER — PHENOL 1.4 % MT LIQD: 1.0000 | OROMUCOSAL | Status: DC | PRN

## 2017-08-28 MED ORDER — BUPIVACAINE-EPINEPHRINE (PF) 0.25% -1:200000 IJ SOLN
INTRAMUSCULAR | Status: DC | PRN
  Administered 2017-08-28: 30 mL

## 2017-08-28 MED ORDER — METOCLOPRAMIDE HCL 5 MG PO TABS: 5.0000 mg | ORAL_TABLET | Freq: Three times a day (TID) | ORAL | Status: DC | PRN

## 2017-08-28 MED ORDER — FERROUS SULFATE 325 (65 FE) MG PO TABS: 325.0000 mg | ORAL_TABLET | Freq: Three times a day (TID) | ORAL | 3 refills | Status: DC

## 2017-08-28 MED ORDER — BUPIVACAINE-EPINEPHRINE (PF) 0.25% -1:200000 IJ SOLN
INTRAMUSCULAR | Status: AC
  Filled 2017-08-28: qty 30

## 2017-08-28 MED ORDER — ASPIRIN 81 MG PO CHEW
81.0000 mg | CHEWABLE_TABLET | Freq: Two times a day (BID) | ORAL | Status: DC
  Administered 2017-08-28 – 2017-08-29 (×2): 81 mg via ORAL
  Filled 2017-08-28 (×2): qty 1

## 2017-08-28 MED ORDER — SODIUM CHLORIDE 0.9 % IJ SOLN
INTRAMUSCULAR | Status: AC
  Filled 2017-08-28: qty 50

## 2017-08-28 MED ORDER — FERROUS SULFATE 325 (65 FE) MG PO TABS
325.0000 mg | ORAL_TABLET | Freq: Three times a day (TID) | ORAL | Status: DC
  Administered 2017-08-29 (×2): 325 mg via ORAL
  Filled 2017-08-28 (×2): qty 1

## 2017-08-28 MED ORDER — BISACODYL 10 MG RE SUPP: 10.0000 mg | Freq: Every day | RECTAL | Status: DC | PRN

## 2017-08-28 MED ORDER — DOCUSATE SODIUM 100 MG PO CAPS: 100.0000 mg | ORAL_CAPSULE | Freq: Two times a day (BID) | ORAL | 0 refills | Status: DC

## 2017-08-28 MED ORDER — MAGNESIUM CITRATE PO SOLN: 1.0000 | Freq: Once | ORAL | Status: DC | PRN

## 2017-08-28 MED ORDER — DEXAMETHASONE SODIUM PHOSPHATE 10 MG/ML IJ SOLN
INTRAMUSCULAR | Status: AC
  Filled 2017-08-28: qty 1

## 2017-08-28 MED ORDER — ALUM & MAG HYDROXIDE-SIMETH 200-200-20 MG/5ML PO SUSP: 15.0000 mL | ORAL | Status: DC | PRN

## 2017-08-28 MED ORDER — MENTHOL 3 MG MT LOZG: 1.0000 | LOZENGE | OROMUCOSAL | Status: DC | PRN

## 2017-08-28 MED ORDER — FENTANYL CITRATE (PF) 100 MCG/2ML IJ SOLN: 25.0000 ug | INTRAMUSCULAR | Status: DC | PRN

## 2017-08-28 MED ORDER — KETOROLAC TROMETHAMINE 30 MG/ML IJ SOLN
INTRAMUSCULAR | Status: AC
  Filled 2017-08-28: qty 1

## 2017-08-28 MED ORDER — PROPOFOL 10 MG/ML IV BOLUS
INTRAVENOUS | Status: AC
  Filled 2017-08-28: qty 20

## 2017-08-28 MED ORDER — DIPHENHYDRAMINE HCL 25 MG PO CAPS: 25.0000 mg | ORAL_CAPSULE | Freq: Four times a day (QID) | ORAL | Status: DC | PRN

## 2017-08-28 MED ORDER — METOPROLOL SUCCINATE ER 50 MG PO TB24
100.0000 mg | ORAL_TABLET | Freq: Every day | ORAL | Status: DC
  Filled 2017-08-28: qty 2

## 2017-08-28 MED ORDER — PROPOFOL 10 MG/ML IV BOLUS
INTRAVENOUS | Status: DC | PRN
  Administered 2017-08-28: 150 mg via INTRAVENOUS
  Administered 2017-08-28 (×2): 20 mg via INTRAVENOUS

## 2017-08-28 MED ORDER — TRANEXAMIC ACID 1000 MG/10ML IV SOLN
1000.0000 mg | Freq: Once | INTRAVENOUS | Status: AC
  Administered 2017-08-28: 1000 mg via INTRAVENOUS
  Filled 2017-08-28: qty 1100

## 2017-08-28 MED ORDER — ONDANSETRON HCL 4 MG/2ML IJ SOLN
INTRAMUSCULAR | Status: DC | PRN
  Administered 2017-08-28: 4 mg via INTRAVENOUS

## 2017-08-28 MED ORDER — ONDANSETRON HCL 4 MG/2ML IJ SOLN
INTRAMUSCULAR | Status: AC
  Filled 2017-08-28: qty 2

## 2017-08-28 MED ORDER — SODIUM CHLORIDE 0.9 % IJ SOLN
INTRAMUSCULAR | Status: DC | PRN
  Administered 2017-08-28: 30 mL

## 2017-08-28 MED ORDER — PROPOFOL 500 MG/50ML IV EMUL
INTRAVENOUS | Status: DC | PRN
  Administered 2017-08-28: 40 ug/kg/min via INTRAVENOUS

## 2017-08-28 MED ORDER — CEFAZOLIN SODIUM-DEXTROSE 2-4 GM/100ML-% IV SOLN
2.0000 g | INTRAVENOUS | Status: AC
  Administered 2017-08-28: 2 g via INTRAVENOUS

## 2017-08-28 MED ORDER — METHOCARBAMOL 1000 MG/10ML IJ SOLN
500.0000 mg | Freq: Four times a day (QID) | INTRAVENOUS | Status: DC | PRN
  Administered 2017-08-28: 500 mg via INTRAVENOUS
  Filled 2017-08-28: qty 550

## 2017-08-28 MED ORDER — SODIUM CHLORIDE 0.9 % IR SOLN
Status: DC | PRN
  Administered 2017-08-28: 1000 mL

## 2017-08-28 MED ORDER — MEPERIDINE HCL 50 MG/ML IJ SOLN: 6.2500 mg | INTRAMUSCULAR | Status: DC | PRN

## 2017-08-28 MED ORDER — METHOCARBAMOL 500 MG PO TABS: 500.0000 mg | ORAL_TABLET | Freq: Four times a day (QID) | ORAL | Status: DC | PRN

## 2017-08-28 MED ORDER — METOCLOPRAMIDE HCL 5 MG/ML IJ SOLN: 5.0000 mg | Freq: Three times a day (TID) | INTRAMUSCULAR | Status: DC | PRN

## 2017-08-28 MED ORDER — LACTATED RINGERS IV SOLN
INTRAVENOUS | Status: DC
  Administered 2017-08-28: 1000 mL via INTRAVENOUS

## 2017-08-28 MED ORDER — CELECOXIB 200 MG PO CAPS
200.0000 mg | ORAL_CAPSULE | Freq: Two times a day (BID) | ORAL | Status: DC
  Administered 2017-08-28 – 2017-08-29 (×2): 200 mg via ORAL
  Filled 2017-08-28 (×2): qty 1

## 2017-08-28 MED ORDER — DEXAMETHASONE SODIUM PHOSPHATE 10 MG/ML IJ SOLN
10.0000 mg | Freq: Once | INTRAMUSCULAR | Status: AC
  Administered 2017-08-29: 10 mg via INTRAVENOUS
  Filled 2017-08-28: qty 1

## 2017-08-28 MED ORDER — FENTANYL CITRATE (PF) 100 MCG/2ML IJ SOLN
50.0000 ug | INTRAMUSCULAR | Status: DC | PRN
  Administered 2017-08-28: 100 ug via INTRAVENOUS

## 2017-08-28 MED ORDER — ONDANSETRON HCL 4 MG PO TABS: 4.0000 mg | ORAL_TABLET | Freq: Four times a day (QID) | ORAL | Status: DC | PRN

## 2017-08-28 MED ORDER — KETOROLAC TROMETHAMINE 30 MG/ML IJ SOLN
INTRAMUSCULAR | Status: DC | PRN
  Administered 2017-08-28: 30 mg

## 2017-08-28 MED ORDER — PROPOFOL 10 MG/ML IV BOLUS
INTRAVENOUS | Status: AC
  Filled 2017-08-28: qty 40

## 2017-08-28 MED ORDER — POLYETHYLENE GLYCOL 3350 17 G PO PACK: 17.0000 g | PACK | Freq: Two times a day (BID) | ORAL | 0 refills | Status: DC

## 2017-08-28 MED ORDER — FENTANYL CITRATE (PF) 100 MCG/2ML IJ SOLN
25.0000 ug | INTRAMUSCULAR | Status: DC | PRN
  Administered 2017-08-28 (×3): 25 ug via INTRAVENOUS

## 2017-08-28 MED ORDER — CEFAZOLIN SODIUM-DEXTROSE 2-4 GM/100ML-% IV SOLN
INTRAVENOUS | Status: AC
Start: 2017-08-28 — End: 2017-08-28
  Filled 2017-08-28: qty 100

## 2017-08-28 MED ORDER — DOCUSATE SODIUM 100 MG PO CAPS
100.0000 mg | ORAL_CAPSULE | Freq: Two times a day (BID) | ORAL | Status: DC
  Administered 2017-08-28 – 2017-08-29 (×2): 100 mg via ORAL
  Filled 2017-08-28 (×2): qty 1

## 2017-08-28 MED ORDER — HYDROCODONE-ACETAMINOPHEN 7.5-325 MG PO TABS: 1.0000 | ORAL_TABLET | ORAL | 0 refills | Status: DC | PRN

## 2017-08-28 MED ORDER — PROMETHAZINE HCL 25 MG/ML IJ SOLN: 6.2500 mg | INTRAMUSCULAR | Status: DC | PRN

## 2017-08-28 MED ORDER — LIDOCAINE 2% (20 MG/ML) 5 ML SYRINGE
INTRAMUSCULAR | Status: DC | PRN
  Administered 2017-08-28: 100 mg via INTRAVENOUS

## 2017-08-28 MED ORDER — BUPIVACAINE IN DEXTROSE 0.75-8.25 % IT SOLN
INTRATHECAL | Status: DC | PRN
  Administered 2017-08-28: 2 mL via INTRATHECAL

## 2017-08-28 MED ORDER — ASPIRIN 81 MG PO CHEW: 81.0000 mg | CHEWABLE_TABLET | Freq: Two times a day (BID) | ORAL | 0 refills | Status: AC

## 2017-08-28 MED ORDER — ONDANSETRON HCL 4 MG/2ML IJ SOLN: 4.0000 mg | Freq: Four times a day (QID) | INTRAMUSCULAR | Status: DC | PRN

## 2017-08-28 MED ORDER — SODIUM CHLORIDE 0.9 % IV SOLN
INTRAVENOUS | Status: DC
  Administered 2017-08-28 – 2017-08-29 (×2): via INTRAVENOUS

## 2017-08-28 MED ORDER — METHOCARBAMOL 500 MG PO TABS: 500.0000 mg | ORAL_TABLET | Freq: Four times a day (QID) | ORAL | 0 refills | Status: DC | PRN

## 2017-08-28 MED ORDER — DEXAMETHASONE SODIUM PHOSPHATE 10 MG/ML IJ SOLN
10.0000 mg | Freq: Once | INTRAMUSCULAR | Status: AC
  Administered 2017-08-28: 10 mg via INTRAVENOUS

## 2017-08-28 MED ORDER — FENTANYL CITRATE (PF) 100 MCG/2ML IJ SOLN
INTRAMUSCULAR | Status: AC
  Administered 2017-08-28: 25 ug via INTRAVENOUS
  Filled 2017-08-28: qty 4

## 2017-08-28 MED ORDER — ROPIVACAINE HCL 7.5 MG/ML IJ SOLN
INTRAMUSCULAR | Status: DC | PRN
  Administered 2017-08-28 (×4): 5 mL via PERINEURAL

## 2017-08-28 MED ORDER — HYDROCHLOROTHIAZIDE 25 MG PO TABS
25.0000 mg | ORAL_TABLET | Freq: Every day | ORAL | Status: DC
  Filled 2017-08-28: qty 1

## 2017-08-28 MED ORDER — CHLORHEXIDINE GLUCONATE 4 % EX LIQD: 60.0000 mL | Freq: Once | CUTANEOUS | Status: DC

## 2017-08-28 MED ORDER — TRANEXAMIC ACID 1000 MG/10ML IV SOLN
1000.0000 mg | INTRAVENOUS | Status: AC
  Administered 2017-08-28: 1000 mg via INTRAVENOUS
  Filled 2017-08-28: qty 1100

## 2017-08-28 MED ORDER — FENTANYL CITRATE (PF) 100 MCG/2ML IJ SOLN
INTRAMUSCULAR | Status: AC
  Administered 2017-08-28: 100 ug via INTRAVENOUS
  Filled 2017-08-28: qty 2

## 2017-08-28 MED ORDER — MIDAZOLAM HCL 2 MG/2ML IJ SOLN
INTRAMUSCULAR | Status: AC
  Administered 2017-08-28: 2 mg via INTRAVENOUS
  Filled 2017-08-28: qty 2

## 2017-08-28 MED ORDER — POLYETHYLENE GLYCOL 3350 17 G PO PACK
17.0000 g | PACK | Freq: Two times a day (BID) | ORAL | Status: DC
  Administered 2017-08-28 – 2017-08-29 (×2): 17 g via ORAL
  Filled 2017-08-28 (×2): qty 1

## 2017-08-28 MED ORDER — CEFAZOLIN SODIUM-DEXTROSE 2-4 GM/100ML-% IV SOLN
2.0000 g | Freq: Four times a day (QID) | INTRAVENOUS | Status: AC
  Administered 2017-08-28 (×2): 2 g via INTRAVENOUS
  Filled 2017-08-28 (×2): qty 100

## 2017-08-28 MED ORDER — LIDOCAINE 2% (20 MG/ML) 5 ML SYRINGE
INTRAMUSCULAR | Status: AC
  Filled 2017-08-28: qty 5

## 2017-08-28 SURGICAL SUPPLY — 46 items
BAG DECANTER FOR FLEXI CONT (MISCELLANEOUS) IMPLANT
BAG ZIPLOCK 12X15 (MISCELLANEOUS) IMPLANT
BANDAGE ACE 6X5 VEL STRL LF (GAUZE/BANDAGES/DRESSINGS) ×2 IMPLANT
BLADE SAW SGTL 11.0X1.19X90.0M (BLADE) IMPLANT
BLADE SAW SGTL 13.0X1.19X90.0M (BLADE) ×2 IMPLANT
BOWL SMART MIX CTS (DISPOSABLE) ×2 IMPLANT
CAPT KNEE TOTAL 3 ATTUNE ×2 IMPLANT
CEMENT HV SMART SET (Cement) ×4 IMPLANT
COVER SURGICAL LIGHT HANDLE (MISCELLANEOUS) ×2 IMPLANT
CUFF TOURN SGL QUICK 34 (TOURNIQUET CUFF) ×1
CUFF TRNQT CYL 34X4X40X1 (TOURNIQUET CUFF) ×1 IMPLANT
DECANTER SPIKE VIAL GLASS SM (MISCELLANEOUS) ×2 IMPLANT
DERMABOND ADVANCED (GAUZE/BANDAGES/DRESSINGS) ×1
DERMABOND ADVANCED .7 DNX12 (GAUZE/BANDAGES/DRESSINGS) ×1 IMPLANT
DRAPE U-SHAPE 47X51 STRL (DRAPES) ×2 IMPLANT
DRESSING AQUACEL AG SP 3.5X10 (GAUZE/BANDAGES/DRESSINGS) ×1 IMPLANT
DRSG AQUACEL AG SP 3.5X10 (GAUZE/BANDAGES/DRESSINGS) ×2
DURAPREP 26ML APPLICATOR (WOUND CARE) ×4 IMPLANT
ELECT REM PT RETURN 15FT ADLT (MISCELLANEOUS) ×2 IMPLANT
GLOVE BIOGEL M 7.0 STRL (GLOVE) ×4 IMPLANT
GLOVE BIOGEL PI IND STRL 7.5 (GLOVE) ×1 IMPLANT
GLOVE BIOGEL PI IND STRL 8.5 (GLOVE) IMPLANT
GLOVE BIOGEL PI INDICATOR 7.5 (GLOVE) ×1
GLOVE BIOGEL PI INDICATOR 8.5 (GLOVE)
GLOVE ECLIPSE 8.0 STRL XLNG CF (GLOVE) IMPLANT
GLOVE ORTHO TXT STRL SZ7.5 (GLOVE) ×4 IMPLANT
GOWN STRL REUS W/TWL LRG LVL3 (GOWN DISPOSABLE) ×4 IMPLANT
GOWN STRL REUS W/TWL XL LVL3 (GOWN DISPOSABLE) ×4 IMPLANT
HANDPIECE INTERPULSE COAX TIP (DISPOSABLE) ×1
MANIFOLD NEPTUNE II (INSTRUMENTS) ×2 IMPLANT
PACK TOTAL KNEE CUSTOM (KITS) ×2 IMPLANT
POSITIONER SURGICAL ARM (MISCELLANEOUS) ×2 IMPLANT
SET HNDPC FAN SPRY TIP SCT (DISPOSABLE) ×1 IMPLANT
SET PAD KNEE POSITIONER (MISCELLANEOUS) ×2 IMPLANT
SUT MNCRL AB 4-0 PS2 18 (SUTURE) ×2 IMPLANT
SUT STRATAFIX 0 PDS 27 VIOLET (SUTURE) ×2
SUT VIC AB 1 CT1 36 (SUTURE) ×2 IMPLANT
SUT VIC AB 2-0 CT1 27 (SUTURE) ×3
SUT VIC AB 2-0 CT1 TAPERPNT 27 (SUTURE) ×3 IMPLANT
SUTURE STRATFX 0 PDS 27 VIOLET (SUTURE) ×1 IMPLANT
SYR 50ML LL SCALE MARK (SYRINGE) IMPLANT
TRAY FOLEY CATH 14FR (SET/KITS/TRAYS/PACK) ×2 IMPLANT
TRAY FOLEY W/METER SILVER 16FR (SET/KITS/TRAYS/PACK) IMPLANT
WATER STERILE IRR 1500ML POUR (IV SOLUTION) ×2 IMPLANT
WRAP KNEE MAXI GEL POST OP (GAUZE/BANDAGES/DRESSINGS) ×2 IMPLANT
YANKAUER SUCT BULB TIP 10FT TU (MISCELLANEOUS) ×2 IMPLANT

## 2017-08-28 NOTE — Anesthesia Procedure Notes (Addendum)
Anesthesia Regional Block: Adductor canal block   Pre-Anesthetic Checklist: ,, timeout performed, Correct Patient, Correct Site, Correct Laterality, Correct Procedure, Correct Position, site marked, Risks and benefits discussed,  Surgical consent,  Pre-op evaluation,  At surgeon's request and post-op pain management  Laterality: Right and Lower  Prep: chloraprep       Needles:  Injection technique: Single-shot  Needle Type: Echogenic Stimulator Needle     Needle Length: 9cm  Needle Gauge: 21   Needle insertion depth: 5 cm   Additional Needles:   Narrative:  Start time: 08/28/2017 7:57 AM End time: 08/28/2017 8:07 AM Injection made incrementally with aspirations every 5 mL.  Performed by: Personally  Anesthesiologist: Lyn Hollingshead

## 2017-08-28 NOTE — Anesthesia Procedure Notes (Signed)
Performed by: Victoriano Lain

## 2017-08-28 NOTE — Progress Notes (Signed)
Assisted Dr. Hatchett with right, ultrasound guided, adductor canal block. Side rails up, monitors on throughout procedure. See vital signs in flow sheet. Tolerated Procedure well.  

## 2017-08-28 NOTE — Evaluation (Signed)
Physical Therapy Evaluation Patient Details Name: Victoria Wallace MRN: [id number] DOB: May 30, 1951 Today's Date: 08/28/2017   History of Present Illness  R TKA  Clinical Impression  The patient tolerated ambulation, noted  Right knee slight buckling. Pt admitted with above diagnosis. Pt currently with functional limitations due to the deficits listed below (see PT Problem List).  Pt will benefit from skilled PT to increase their independence and safety with mobility to allow discharge to the venue listed below.       Follow Up Recommendations Home health PT;Supervision/Assistance - 24 hour    Equipment Recommendations  None recommended by PT    Recommendations for Other Services       Precautions / Restrictions Precautions Precautions: Fall;Knee Restrictions Weight Bearing Restrictions: No      Mobility  Bed Mobility Overal bed mobility: Needs Assistance Bed Mobility: Supine to Sit     Supine to sit: Min assist     General bed mobility comments: support the right knee  Transfers Overall transfer level: Needs assistance Equipment used: Rolling walker (2 wheeled) Transfers: Sit to/from Stand Sit to Stand: Min assist         General transfer comment: cues for sequence and hand palcement  Ambulation/Gait Ambulation/Gait assistance: Min assist Ambulation Distance (Feet): 80 Feet Assistive device: Rolling walker (2 wheeled) Gait Pattern/deviations: Step-to pattern     General Gait Details: cues for sequence and posture.   Stairs            Wheelchair Mobility    Modified Rankin (Stroke Patients Only)       Balance                                             Pertinent Vitals/Pain Pain Assessment: 0-10 Pain Score: 3  Pain Location: right knee Pain Descriptors / Indicators: Discomfort Pain Intervention(s): Premedicated before session;Repositioned;Ice applied    Home Living Family/patient expects to be discharged to:: Private  residence Living Arrangements: Other relatives;Non-relatives/Friends Available Help at Discharge: Family Type of Home: House Home Access: Stairs to enter Entrance Stairs-Rails: Left Entrance Stairs-Number of Steps: 3 Home Layout: Two level;Able to live on main level with bedroom/bathroom Home Equipment: Gilford Rile - 2 wheels      Prior Function Level of Independence: Independent               Hand Dominance        Extremity/Trunk Assessment   Upper Extremity Assessment Upper Extremity Assessment: Overall WFL for tasks assessed    Lower Extremity Assessment Lower Extremity Assessment: RLE deficits/detail RLE Deficits / Details: unable to perform SLR, quads 3/5    Cervical / Trunk Assessment Cervical / Trunk Assessment: Normal  Communication   Communication: No difficulties  Cognition Arousal/Alertness: Awake/alert Behavior During Therapy: WFL for tasks assessed/performed Overall Cognitive Status: Within Functional Limits for tasks assessed                                        General Comments      Exercises Total Joint Exercises Ankle Circles/Pumps: AROM;Right;5 reps Quad Sets: AROM;Right;5 reps Heel Slides: AAROM;Right;5 reps   Assessment/Plan    PT Assessment Patient needs continued PT services  PT Problem List Decreased strength;Decreased range of motion;Decreased activity tolerance;Decreased balance;Decreased mobility;Decreased safety awareness;Decreased knowledge of  precautions;Decreased knowledge of use of DME       PT Treatment Interventions DME instruction;Gait training;Stair training;Functional mobility training;Therapeutic activities;Patient/family education    PT Goals (Current goals can be found in the Care Plan section)  Acute Rehab PT Goals Patient Stated Goal: to walk  PT Goal Formulation: With patient/family Time For Goal Achievement: 08/31/17 Potential to Achieve Goals: Good    Frequency 7X/week   Barriers to  discharge        Co-evaluation               AM-PAC PT "6 Clicks" Daily Activity  Outcome Measure Difficulty turning over in bed (including adjusting bedclothes, sheets and blankets)?: Unable Difficulty moving from lying on back to sitting on the side of the bed? : Unable Difficulty sitting down on and standing up from a chair with arms (e.g., wheelchair, bedside commode, etc,.)?: A Lot Help needed moving to and from a bed to chair (including a wheelchair)?: A Lot Help needed walking in hospital room?: A Lot Help needed climbing 3-5 steps with a railing? : A Lot 6 Click Score: 10    End of Session Equipment Utilized During Treatment: Gait belt Activity Tolerance: Patient tolerated treatment well Patient left: in chair;with call bell/phone within reach;with family/visitor present Nurse Communication: Mobility status PT Visit Diagnosis: Difficulty in walking, not elsewhere classified (R26.2);Pain Pain - Right/Left: Right Pain - part of body: Knee    Time: 1411-1445 PT Time Calculation (min) (ACUTE ONLY): 34 min   Charges:   PT Evaluation $PT Eval Low Complexity: 1 Low PT Treatments $Gait Training: 8-22 mins   PT G Codes:   PT G-Codes **NOT FOR INPATIENT CLASS** Functional Assessment Tool Used: Clinical judgement Functional Limitation: Mobility: Walking and moving around Mobility: Walking and Moving Around Current Status (L8937): At least 20 percent but less than 40 percent impaired, limited or restricted Mobility: Walking and Moving Around Goal Status [id number]): At least 1 percent but less than 20 percent impaired, limited or restricted    [facility] PT [phone]   Claretha Cooper 08/28/2017, 3:07 PM

## 2017-08-28 NOTE — Discharge Instructions (Signed)

## 2017-08-28 NOTE — Transfer of Care (Signed)
Immediate Anesthesia Transfer of Care Note  Patient: Victoria Wallace  Procedure(s) Performed: Procedure(s) with comments: RIGHT TOTAL KNEE ARTHROPLASTY (Right) - 70 mins  Patient Location: PACU  Anesthesia Type:Spinal, MAC combined with regional for post-op pain and converted to general with LMA during procedure.  Level of Consciousness: awake, alert  and patient cooperative  Airway & Oxygen Therapy: Patient Spontanous Breathing and Patient connected to face mask oxygen  Post-op Assessment: Report given to RN and Post -op Vital signs reviewed and stable  Post vital signs: Reviewed and stable  Last Vitals:  Vitals:   08/28/17 0828 08/28/17 1034  BP:  (P) 137/79  Pulse: 67 (P) 62  Resp: 12 (P) 13  Temp:  (!) (P) 36.3 C  SpO2: 100% (P) 100%    Last Pain:  Vitals:   08/28/17 0635  TempSrc: Oral      Patients Stated Pain Goal: 4 (12/19/70 5366)  Complications: No apparent anesthesia complications and failed spinal that required conversion to general.

## 2017-08-28 NOTE — Anesthesia Postprocedure Evaluation (Signed)
Anesthesia Post Note  Patient: Victoria Wallace  Procedure(s) Performed: Procedure(s) (LRB): RIGHT TOTAL KNEE ARTHROPLASTY (Right)     Patient location during evaluation: PACU Anesthesia Type: Spinal Level of consciousness: awake and sedated Pain management: pain level controlled Vital Signs Assessment: post-procedure vital signs reviewed and stable Respiratory status: spontaneous breathing Cardiovascular status: stable Postop Assessment: no headache, no backache, spinal receding, patient able to bend at knees and no signs of nausea or vomiting Anesthetic complications: no    Last Vitals:  Vitals:   08/28/17 1200 08/28/17 1215  BP: (!) 117/59 119/81  Pulse: 69 60  Resp: 14 16  Temp:    SpO2: 100% 100%    Last Pain:  Vitals:   08/28/17 1215  TempSrc:   PainSc: 3    Pain Goal: Patients Stated Pain Goal: 4 (08/28/17 1034)               [place]

## 2017-08-28 NOTE — Anesthesia Preprocedure Evaluation (Addendum)
Anesthesia Evaluation  Patient identified by MRN, date of birth, ID band Patient awake    Reviewed: Allergy & Precautions, NPO status , Patient's Chart, lab work & pertinent test results, reviewed documented beta blocker date and time   Airway Mallampati: I       Dental no notable dental hx. (+) Teeth Intact   Pulmonary neg pulmonary ROS,    Pulmonary exam normal breath sounds clear to auscultation       Cardiovascular hypertension, Pt. on medications and Pt. on home beta blockers Normal cardiovascular exam Rhythm:Regular Rate:Normal     Neuro/Psych negative neurological ROS  negative psych ROS   GI/Hepatic negative GI ROS, Neg liver ROS,   Endo/Other  negative endocrine ROS  Renal/GU negative Renal ROS  negative genitourinary   Musculoskeletal  (+) Arthritis , Osteoarthritis,    Abdominal Normal abdominal exam  (+)   Peds  Hematology negative hematology ROS (+)   Anesthesia Other Findings   Reproductive/Obstetrics                             Anesthesia Physical Anesthesia Plan  ASA: II  Anesthesia Plan: Spinal   Post-op Pain Management:  Regional for Post-op pain   Induction:   PONV Risk Score and Plan: 2 and Ondansetron and Dexamethasone  Airway Management Planned: Natural Airway, Nasal Cannula and Simple Face Mask  Additional Equipment:   Intra-op Plan:   Post-operative Plan:   Informed Consent: I have reviewed the patients History and Physical, chart, labs and discussed the procedure including the risks, benefits and alternatives for the proposed anesthesia with the patient or authorized representative who has indicated his/her understanding and acceptance.     Plan Discussed with: CRNA and Surgeon  Anesthesia Plan Comments:         Anesthesia Quick Evaluation

## 2017-08-28 NOTE — Interval H&P Note (Signed)
History and Physical Interval Note:  08/28/2017 6:56 AM  Victoria Wallace  has presented today for surgery, with the diagnosis of Right knee osteoarthritis  The various methods of treatment have been discussed with the patient and family. After consideration of risks, benefits and other options for treatment, the patient has consented to  Procedure(s) with comments: RIGHT TOTAL KNEE ARTHROPLASTY (Right) - 70 mins as a surgical intervention .  The patient's history has been reviewed, patient examined, no change in status, stable for surgery.  I have reviewed the patient's chart and labs.  Questions were answered to the patient's satisfaction.     Mauri Pole

## 2017-08-28 NOTE — Anesthesia Procedure Notes (Signed)
Spinal  Start time: 08/28/2017 8:35 AM End time: 08/28/2017 8:40 AM Staffing Resident/CRNA: Carleene Cooper A Preanesthetic Checklist Completed: patient identified, site marked, surgical consent, pre-op evaluation, timeout performed, IV checked, risks and benefits discussed and monitors and equipment checked Spinal Block Patient position: sitting Prep: ChloraPrep and site prepped and draped Patient monitoring: heart rate, continuous pulse ox and blood pressure Approach: midline Location: L2-3 Injection technique: single-shot Needle Needle type: Pencan  Needle gauge: 24 G Needle length: 10 cm Assessment Sensory level: T4 Additional Notes Spinal kit expiration date checked and verified. Pt in sitting position. Tolerated well. One attempt by CRNA. - heme, + CSF

## 2017-08-28 NOTE — Op Note (Signed)
NAME:  Victoria Wallace RECORD NO.:  [id number]                             FACILITY:  [facility]      PHYSICIAN:  Pietro Cassis. Alvan Dame, M.D.  DATE OF BIRTH:  1951-04-01      DATE OF PROCEDURE:  08/28/2017                                     OPERATIVE REPORT         PREOPERATIVE DIAGNOSIS:  Right knee osteoarthritis.      POSTOPERATIVE DIAGNOSIS:  Right knee osteoarthritis.      FINDINGS:  The patient was noted to have complete loss of cartilage and   bone-on-bone arthritis with associated osteophytes in the lateral and patellofemoral compartments of   the knee with a significant synovitis and associated effusion.      PROCEDURE:  Right total knee replacement.      COMPONENTS USED:  DePuy Attune rotating platform posterior stabilized knee   system, a size 5N femur, 5 tibia, size 7 mm PS AOX insert, and 35 anatomic patellar   button.      SURGEON:  Pietro Cassis. Alvan Dame, M.D.      ASSISTANT:  Nehemiah Massed, PA-C.      ANESTHESIA:  Regional and Spinal.      SPECIMENS:  None.      COMPLICATION:  None.      DRAINS:  None.  EBL: <100cc      TOURNIQUET TIME:   Total Tourniquet Time Documented: Thigh (Right) - 29 minutes Total: Thigh (Right) - 29 minutes  .      The patient was stable to the recovery room.      INDICATION FOR PROCEDURE:  Victoria Wallace is a 66 y.o. female patient of   mine.  The patient had been seen, evaluated, and treated conservatively in the   office with medication, activity modification, and injections.  The patient had   radiographic changes of bone-on-bone arthritis with endplate sclerosis and osteophytes noted.      The patient failed conservative measures including medication, injections, and activity modification, and at this point was ready for more definitive measures.   Based on the radiographic changes and failed conservative measures, the patient   decided to proceed with total knee replacement.  Risks of infection,   DVT,  component failure, need for revision surgery, postop course, and   expectations were all   discussed and reviewed.  Consent was obtained for benefit of pain   relief.      PROCEDURE IN DETAIL:  The patient was brought to the operative theater.   Once adequate anesthesia, preoperative antibiotics, 2 gm of Ancef, 1 gm of Tranexamic Acid, and 10 mg of Decadron administered, the patient was positioned supine with the right thigh tourniquet placed.  The  right lower extremity was prepped and draped in sterile fashion.  A time-   out was performed identifying the patient, planned procedure, and   extremity.      The right lower extremity was placed in the [facility] leg holder.  The leg was   exsanguinated, tourniquet elevated to 250 mmHg.  A midline incision was  made followed by median parapatellar arthrotomy.  Following initial   exposure, attention was first directed to the patella.  Precut   measurement was noted to be 21 mm.  I resected down to 13-14 mm and used a   35 anatomic patellar button to restore patellar height as well as cover the cut   surface.      The lug holes were drilled and a metal shim was placed to protect the   patella from retractors and saw blades.      At this point, attention was now directed to the femur.  The femoral   canal was opened with a drill, irrigated to try to prevent fat emboli.  An   intramedullary rod was passed at 3 degrees valgus, 9 mm of bone was   resected off the distal femur.  Following this resection, the tibia was   subluxated anteriorly.  Using the extramedullary guide, 4 mm of bone was resected off   the proximal medial tibia.  We confirmed the gap would be   stable medially and laterally with a size 5 spacer block as well as confirmed   the cut was perpendicular in the coronal plane, checking with an alignment rod.      Once this was done, I sized the femur to be a size 5 in the anterior-   posterior dimension, chose a narrow component  based on medial and   lateral dimension.  The size 5 rotation block was then pinned in   position anterior referenced using the C-clamp to set rotation.  The   anterior, posterior, and  chamfer cuts were made without difficulty nor   notching making certain that I was along the anterior cortex to help   with flexion gap stability.      The final box cut was made off the lateral aspect of distal femur.      At this point, the tibia was sized to be a size 5, the size 5 tray was   then pinned in position through the medial third of the tubercle,   drilled, and keel punched.  Trial reduction was now carried with a 5 femur,  5 tibia, a size 6 then 7 mm PS insert, and the 35 anatomic patella botton.  The knee was brought to   extension, full extension with good flexion stability with the patella   tracking through the trochlea without application of pressure.  Given   all these findings the femoral lug holes were drilled and then the trial components removed.  Final components were   opened and cement was mixed.  The knee was irrigated with normal saline   solution and pulse lavage.  The synovial lining was   then injected with 30 cc of 0.25% Marcaine with epinephrine and 1 cc of Toradol plus 30 cc of NS for a total of 61 cc.      The knee was irrigated.  Final implants were then cemented onto clean and   dried cut surfaces of bone with the knee brought to extension with a size 7 mm PS trial insert.      Once the cement had fully cured, the excess cement was removed   throughout the knee.  I confirmed I was satisfied with the range of   motion and stability, and the final size 7 mm PS AOX insert was chosen.  It was   placed into the knee.      The tourniquet had been let  down at 29 minutes.  No significant   hemostasis required.  The medium Hemovac drain was placed deep.  The   extensor mechanism was then reapproximated using #1 Vicryl with the knee   in flexion.  The   remaining wound was  closed with 2-0 Vicryl and running 4-0 Monocryl.   The knee was cleaned, dried, dressed sterilely using Dermabond and   Aquacel dressing.  Drain site dressed separately.  The patient was then   brought to recovery room in stable condition, tolerating the procedure   well.   Please note that Physician Assistant, Nehemiah Massed, PA-C, was present for the entirety of the case, and was utilized for pre-operative positioning, peri-operative retractor management, general facilitation of the procedure.  He was also utilized for primary wound closure at the end of the case.              Pietro Cassis Alvan Dame, M.D.    08/28/2017 10:09 AM

## 2017-08-28 NOTE — Anesthesia Procedure Notes (Signed)
Procedure Name: LMA Insertion Date/Time: 08/28/2017 9:18 AM Performed by: Carleene Cooper A Pre-anesthesia Checklist: Patient identified, Emergency Drugs available, Suction available, Patient being monitored and Timeout performed Patient Re-evaluated:Patient Re-evaluated prior to induction Oxygen Delivery Method: Circle system utilized Preoxygenation: Pre-oxygenation with 100% oxygen Induction Type: IV induction Ventilation: Mask ventilation without difficulty LMA: LMA with gastric port inserted LMA Size: 4.0 Number of attempts: 1 Placement Confirmation: positive ETCO2 and breath sounds checked- equal and bilateral Tube secured with: Tape Dental Injury: Teeth and Oropharynx as per pre-operative assessment

## 2017-08-29 DIAGNOSIS — M1711 Unilateral primary osteoarthritis, right knee: Secondary | ICD-10-CM | POA: Diagnosis not present

## 2017-08-29 LAB — CBC
HEMATOCRIT: 26.8 % — AB (ref 36.0–46.0)
Hemoglobin: 9.2 g/dL — ABNORMAL LOW (ref 12.0–15.0)
MCH: 32.9 pg (ref 26.0–34.0)
MCHC: 34.3 g/dL (ref 30.0–36.0)
MCV: 95.7 fL (ref 78.0–100.0)
PLATELETS: 185 10*3/uL (ref 150–400)
RBC: 2.8 MIL/uL — ABNORMAL LOW (ref 3.87–5.11)
RDW: 13.2 % (ref 11.5–15.5)
WBC: 9 10*3/uL (ref 4.0–10.5)

## 2017-08-29 LAB — BASIC METABOLIC PANEL
ANION GAP: 5 (ref 5–15)
BUN: 14 mg/dL (ref 6–20)
CALCIUM: 8.7 mg/dL — AB (ref 8.9–10.3)
CO2: 27 mmol/L (ref 22–32)
Chloride: 106 mmol/L (ref 101–111)
Creatinine, Ser: 0.7 mg/dL (ref 0.44–1.00)
GFR calc Af Amer: >60 mL/min (ref >60)
GLUCOSE: 105 mg/dL — AB (ref 65–99)
Potassium: 3.6 mmol/L (ref 3.5–5.1)
Sodium: 138 mmol/L (ref 135–145)

## 2017-08-29 NOTE — Progress Notes (Signed)
Physical Therapy Treatment Patient Details Name: Victoria Wallace MRN: [id number] DOB: 1951/12/13 Today's Date: 08/29/2017    History of Present Illness R TKA    PT Comments    The patient was not dizzy after ambulation. BP 108/54. Plans DC later today.   Follow Up Recommendations  Home health PT;Supervision/Assistance - 24 hour     Equipment Recommendations  None recommended by PT    Recommendations for Other Services       Precautions / Restrictions Precautions Precautions: Fall;Knee Restrictions Weight Bearing Restrictions: No    Mobility  Bed Mobility   Bed Mobility: Sit to Supine       Sit to supine: Supervision   General bed mobility comments: self assisted with the left leg  Transfers Overall transfer level: Needs assistance Equipment used: Rolling walker (2 wheeled) Transfers: Sit to/from Stand Sit to Stand: Min guard         General transfer comment: for safety  Ambulation/Gait Ambulation/Gait assistance: Min assist Ambulation Distance (Feet): 160 Feet Assistive device: Rolling walker (2 wheeled) Gait Pattern/deviations: Step-to pattern     General Gait Details: cues for sequence and posture. BP108/54, not dizzy   Stairs            Wheelchair Mobility    Modified Rankin (Stroke Patients Only)       Balance                                            Cognition Arousal/Alertness: Awake/alert Behavior During Therapy: WFL for tasks assessed/performed Overall Cognitive Status: Within Functional Limits for tasks assessed                                        Exercises Total Joint Exercises Ankle Circles/Pumps: AROM;Both;10 reps Quad Sets: AROM;Both;10 reps Towel Squeeze: AROM;Right;10 reps Short Arc Quad: AROM;Right;10 reps Heel Slides: AAROM;Right Straight Leg Raises: AAROM;Right;10 reps Long Arc Quad: AAROM;Right;10 reps Knee Flexion: AAROM;Right;10 reps Goniometric ROM: 5-80 knee  flexion-right    General Comments        Pertinent Vitals/Pain Pain Score: 2  Pain Location: right knee Pain Descriptors / Indicators: Discomfort;Aching Pain Intervention(s): Premedicated before session;Monitored during session;Ice applied    Home Living Family/patient expects to be discharged to:: Private residence Living Arrangements: Other relatives;Non-relatives/Friends Available Help at Discharge: Family         Home Equipment: Bedside commode      Prior Function Level of Independence: Independent          PT Goals (current goals can now be found in the care plan section) Acute Rehab PT Goals Patient Stated Goal: to walk  Progress towards PT goals: Progressing toward goals    Frequency    7X/week      PT Plan Current plan remains appropriate    Co-evaluation              AM-PAC PT "6 Clicks" Daily Activity  Outcome Measure  Difficulty turning over in bed (including adjusting bedclothes, sheets and blankets)?: A Little Difficulty moving from lying on back to sitting on the side of the bed? : A Little Difficulty sitting down on and standing up from a chair with arms (e.g., wheelchair, bedside commode, etc,.)?: A Little Help needed moving to and from a bed to chair (  including a wheelchair)?: A Little Help needed walking in hospital room?: A Little Help needed climbing 3-5 steps with a railing? : A Lot 6 Click Score: 17    End of Session Equipment Utilized During Treatment: Gait belt Activity Tolerance: Patient tolerated treatment well Patient left: in bed;with call bell/phone within reach Nurse Communication: Mobility status PT Visit Diagnosis: Difficulty in walking, not elsewhere classified (R26.2);Pain Pain - Right/Left: Right Pain - part of body: Knee     Time: 9381-0175 PT Time Calculation (min) (ACUTE ONLY): 54 min  Charges:  $Gait Training: 8-22 mins $Therapeutic Exercise: 8-22 mins $Self Care/Home Management: 23-37                     G CodesTresa Wallace PT [phone] e}   Victoria Wallace 08/29/2017, 12:36 PM

## 2017-08-29 NOTE — Evaluation (Signed)
Occupational Therapy Evaluation Patient Details Name: Victoria Wallace MRN: [id number] DOB: 02-28-1951 Today's Date: 08/29/2017    History of Present Illness R TKA   Clinical Impression   This 66 year old female was admitted for the above sx. All education was completed. No further OT is needed at this time    Follow Up Recommendations  No OT follow up    Equipment Recommendations  None recommended by OT    Recommendations for Other Services       Precautions / Restrictions Precautions Precautions: Fall;Knee Restrictions Weight Bearing Restrictions: No      Mobility Bed Mobility               General bed mobility comments: oob  Transfers   Equipment used: Rolling walker (2 wheeled)   Sit to Stand: Min guard         General transfer comment: for safety    Balance                                           ADL either performed or assessed with clinical judgement   ADL Overall ADL's : Needs assistance/impaired Eating/Feeding: Independent   Grooming: Supervision/safety;Standing   Upper Body Bathing: Set up;Sitting   Lower Body Bathing: Min guard;Sit to/from stand   Upper Body Dressing : Set up;Sitting   Lower Body Dressing: Minimal assistance;Sit to/from stand   Toilet Transfer: Min guard;Ambulation;Comfort height toilet;RW;Grab bars   Toileting- Clothing Manipulation and Hygiene: Min guard;Sit to/from stand         General ADL Comments: completed adl in bathroom. demonstrated shower transfer and pt verbalizes:  did not feel she needed to practice this.  Reviewed knee precautions and safety.       Vision         Perception     Praxis      Pertinent Vitals/Pain Pain Score: 2  Pain Location: right knee Pain Descriptors / Indicators: Discomfort Pain Intervention(s): Limited activity within patient's tolerance;Monitored during session;Premedicated before session;Repositioned;Ice applied     Hand Dominance      Extremity/Trunk Assessment Upper Extremity Assessment Upper Extremity Assessment: Overall WFL for tasks assessed           Communication Communication Communication: No difficulties   Cognition Arousal/Alertness: Awake/alert Behavior During Therapy: WFL for tasks assessed/performed Overall Cognitive Status: Within Functional Limits for tasks assessed                                     General Comments       Exercises     Shoulder Instructions      Home Living Family/patient expects to be discharged to:: Private residence Living Arrangements: Other relatives;Non-relatives/Friends Available Help at Discharge: Family               Bathroom Shower/Tub: Walk-in Corporate treasurer Toilet: Handicapped height     Home Equipment: Bedside commode          Prior Functioning/Environment Level of Independence: Independent                 OT Problem List:        OT Treatment/Interventions:      OT Goals(Current goals can be found in the care plan section) Acute Rehab OT Goals Patient Stated Goal: to  walk  OT Goal Formulation: All assessment and education complete, DC therapy  OT Frequency:     Barriers to D/C:            Co-evaluation              AM-PAC PT "6 Clicks" Daily Activity     Outcome Measure Help from another person eating meals?: None Help from another person taking care of personal grooming?: A Little Help from another person toileting, which includes using toliet, bedpan, or urinal?: A Little Help from another person bathing (including washing, rinsing, drying)?: A Little Help from another person to put on and taking off regular upper body clothing?: A Little Help from another person to put on and taking off regular lower body clothing?: A Little 6 Click Score: 19   End of Session    Activity Tolerance: No increased pain Patient left: in chair;with call bell/phone within reach;with family/visitor present  OT  Visit Diagnosis: Pain Pain - part of body: Knee                Time: 2336-1224 OT Time Calculation (min): 26 min Charges:  OT General Charges $OT Visit: 1 Visit OT Evaluation $OT Eval Low Complexity: 1 Low G-Codes: OT G-codes **NOT FOR INPATIENT CLASS** Functional Assessment Tool Used: Clinical judgement Functional Limitation: Self care Self Care Current Status (S9753): At least 1 percent but less than 20 percent impaired, limited or restricted Self Care Goal Status (Y0511): At least 1 percent but less than 20 percent impaired, limited or restricted Self Care Discharge Status [phone]): At least 1 percent but less than 20 percent impaired, limited or restricted   Victoria Wallace, OTR/L [phone] 08/29/2017  Victoria Wallace 08/29/2017, 10:40 AM

## 2017-08-29 NOTE — Progress Notes (Signed)
Physical Therapy Treatment Patient Details Name: Victoria Wallace MRN: [id number] DOB: Nov 06, 1951 Today's Date: 08/29/2017    History of Present Illness R TKA    PT Comments    Ready for Dc today   Follow Up Recommendations  Home health PT;Supervision/Assistance - 24 hour     Equipment Recommendations  None recommended by PT    Recommendations for Other Services       Precautions / Restrictions Precautions Precautions: Fall;Knee    Mobility  Bed Mobility Overal bed mobility: Modified Independent Bed Mobility: Sit to Supine       Sit to supine: Supervision   General bed mobility comments: self assisted with the left leg  Transfers Overall transfer level: Needs assistance Equipment used: Rolling walker (2 wheeled) Transfers: Sit to/from Stand Sit to Stand: Supervision         General transfer comment: for safety  Ambulation/Gait Ambulation/Gait assistance: Min guard Ambulation Distance (Feet): 130 Feet Assistive device: Rolling walker (2 wheeled) Gait Pattern/deviations: Step-to pattern     General Gait Details: cues for sequence and posture. BP108/54, not dizzy   Stairs Stairs: Yes   Stair Management: One rail Left;Step to pattern;With cane Number of Stairs: 3 General stair comments: cues for sequence, pt. practiced with folded RW and min assist  Wheelchair Mobility    Modified Rankin (Stroke Patients Only)       Balance                                            Cognition Arousal/Alertness: Awake/alert                                            Exercises Total Joint Exercises Ankle Circles/Pumps: AROM;Both;10 reps Quad Sets: AROM;Both;10 reps Towel Squeeze: AROM;Right;10 reps Short Arc Quad: AROM;Right;10 reps Heel Slides: AAROM;Right Straight Leg Raises: AAROM;Right;10 reps Long Arc Quad: AAROM;Right;10 reps Knee Flexion: AAROM;Right;10 reps Goniometric ROM: 5-80 knee flexion-right     General Comments        Pertinent Vitals/Pain Pain Score: 2  Pain Location: right knee Pain Descriptors / Indicators: Discomfort;Aching Pain Intervention(s): Premedicated before session    Home Living                      Prior Function            PT Goals (current goals can now be found in the care plan section) Progress towards PT goals: Progressing toward goals    Frequency    7X/week      PT Plan Current plan remains appropriate    Co-evaluation              AM-PAC PT "6 Clicks" Daily Activity  Outcome Measure  Difficulty turning over in bed (including adjusting bedclothes, sheets and blankets)?: None Difficulty moving from lying on back to sitting on the side of the bed? : None Difficulty sitting down on and standing up from a chair with arms (e.g., wheelchair, bedside commode, etc,.)?: A Little Help needed moving to and from a bed to chair (including a wheelchair)?: A Little Help needed walking in hospital room?: A Little Help needed climbing 3-5 steps with a railing? : A Little 6 Click Score: 20    End of Session  Equipment Utilized During Treatment: Gait belt Activity Tolerance: Patient tolerated treatment well Patient left: in bed;with family/visitor present Nurse Communication: Mobility status PT Visit Diagnosis: Difficulty in walking, not elsewhere classified (R26.2);Pain Pain - Right/Left: Right Pain - part of body: Knee     Time: 1326-1350 PT Time Calculation (min) (ACUTE ONLY): 24 min  Charges:  $Gait Training: 23-37 mins $                    G CodesTresa Wallace PT [phone]    Victoria Wallace 08/29/2017, 2:54 PM

## 2017-08-29 NOTE — Progress Notes (Signed)
Discharge planning, spoke with patient and spouse at bedside. [facility] chosen [facility]. [facility] for referral. Has Deport and [facility]. [phone]

## 2017-08-29 NOTE — Progress Notes (Signed)
     Subjective: 1 Day Post-Op Procedure(s) (LRB): RIGHT TOTAL KNEE ARTHROPLASTY (Right)   Patient reports pain as mild, no significant pain since surgery. Looking forward to working with PT.  Ready to be discharged home.   Objective:   VITALS:   Vitals:   08/29/17 0628 08/29/17 0841  BP: (!) 112/51 (!) 110/53  Pulse: 68 73  Resp: 16   Temp: 98.1 F (36.7 C)   SpO2: 98%     Dorsiflexion/Plantar flexion intact Incision: dressing C/D/I No cellulitis present Compartment soft  LABS  Recent Labs  08/29/17 0531  HGB 9.2*  HCT 26.8*  WBC 9.0  PLT 185     Recent Labs  08/29/17 0531  NA 138  K 3.6  BUN 14  CREATININE 0.70  GLUCOSE 105*     Assessment/Plan: 1 Day Post-Op Procedure(s) (LRB): RIGHT TOTAL KNEE ARTHROPLASTY (Right) Foley cath d/c'ed Advance diet Up with therapy D/C IV fluids Discharge home Follow up in 2 weeks at [facility]. Follow up with OLIN,Malerie Eakins D in 2 weeks.  Contact information:  [facility] [address], [place] [phone]    Overweight (BMI 25-29.9) Estimated body mass index is 29.41 kg/m as calculated from the following:   Height as of this encounter: 5\' 10"  (1.778 m).   Weight as of this encounter: 93 kg (205 lb). Patient also counseled that weight may inhibit the healing process Patient counseled that losing weight will help with future health issues        West Pugh. Zara Wendt   PAC  08/29/2017, 8:54 AM

## 2017-08-29 NOTE — Care Management Obs Status (Signed)
[facility] NOTIFICATION   Patient Details  Name: Victoria Wallace MRN: [id number] Date of Birth: 1951/01/28   Medicare Observation Status Notification Given:  Yes    Guadalupe Maple, RN 08/29/2017, 10:39 AM

## 2017-08-30 NOTE — Discharge Summary (Signed)
Physician Discharge Summary  Patient ID: Victoria Wallace MRN: [id number] DOB/AGE: 03/18/1951 66 y.o.  Admit date: 08/28/2017 Discharge date: 08/29/2017   Procedures:  Procedure(s) (LRB): RIGHT TOTAL KNEE ARTHROPLASTY (Right)  Attending Physician:  Dr. Paralee Cancel   Admission Diagnoses:   Right knee primary OA / pain  Discharge Diagnoses:  Principal Problem:   S/P right TKA Active Problems:   S/P total knee replacement  Past Medical History:  Diagnosis Date  . Abnormal dexamethasone suppression test 06/2014   recieved info from patient  . Arthritis   . Asymptomatic microscopic hematuria    monitored by PCP   . Chronic constipation   . Encounter for hepatitis C screening test for low risk patient 05/18/2017  . Flu vaccine 08/13/2017  . History of colonoscopy 2012  . Hypertension   . shingles vaccine 2011   recieved info from patient  . Tetanus toxoid vaccination administered within the past year 05/18/2017  . Uterine prolapse    Wears Pessirey    HPI:    Victoria Wallace, 66 y.o. female, has a history of pain and functional disability in the right knee due to arthritis and has failed non-surgical conservative treatments for greater than 12 weeks to includeNSAID's and/or analgesics and activity modification.  Onset of symptoms was gradual, starting >10 years ago with gradually worsening course since that time. The patient noted no past surgery on the right knee(s).  Patient currently rates pain in the right knee(s) at 7 out of 10 with activity. Patient has night pain, worsening of pain with activity and weight bearing, pain that interferes with activities of daily living, pain with passive range of motion, crepitus and joint swelling.  Patient has evidence of periarticular osteophytes and joint space narrowing by imaging studies.  There is no active infection.   Risks, benefits and expectations were discussed with the patient.  Risks including but not limited to the risk of  anesthesia, blood clots, nerve damage, blood vessel damage, failure of the prosthesis, infection and up to and including death.  Patient understand the risks, benefits and expectations and wishes to proceed with surgery.   PCP: Victoria Erp, MD   Discharged Condition: good  Hospital Course:  Patient underwent the above stated procedure on 08/28/2017. Patient tolerated the procedure well and brought to the recovery room in good condition and subsequently to the floor.  POD #1 BP: 110/53 ; Pulse: 73 ; Temp: 98.1 F (36.7 C) ; Resp: 16 Patient reports pain as mild, no significant pain since surgery. Looking forward to working with PT.  Ready to be discharged home.  Dorsiflexion/plantar flexion intact, incision: dressing C/D/I, no cellulitis present and compartment soft.   LABS  Basename    HGB     9.2  HCT     26.8    Discharge Exam: General appearance: alert, cooperative and no distress Extremities: Homans sign is negative, no sign of DVT, no edema, redness or tenderness in the calves or thighs and no ulcers, gangrene or trophic changes  Disposition: Home with follow up in 2 weeks   Follow-up Information    Paralee Cancel, MD. Schedule an appointment as soon as possible for a visit in 2 week(s).   Specialty:  Orthopedic Surgery Contact information: [address] [place] [address] [phone]        [facility].   Specialty:  Home Health Services Why:  physical therapy Contact information: [place]  [place] [address] [phone]           Discharge Instructions    Call MD / Call 911    Complete by:  As directed    If you experience chest pain or shortness of breath, CALL 911 and be transported to the hospital emergency room.  If you develope a fever above 101 F, pus (white drainage) or increased drainage or redness at the wound, or calf pain, call your surgeon's office.   Change dressing    Complete by:  As  directed    Maintain surgical dressing until follow up in the clinic. If the edges start to pull up, may reinforce with tape. If the dressing is no longer working, may remove and cover with gauze and tape, but must keep the area dry and clean.  Call with any questions or concerns.   Constipation Prevention    Complete by:  As directed    Drink plenty of fluids.  Prune juice may be helpful.  You may use a stool softener, such as Colace (over the counter) 100 mg twice a day.  Use MiraLax (over the counter) for constipation as needed.   Diet - low sodium heart healthy    Complete by:  As directed    Discharge instructions    Complete by:  As directed    Maintain surgical dressing until follow up in the clinic. If the edges start to pull up, may reinforce with tape. If the dressing is no longer working, may remove and cover with gauze and tape, but must keep the area dry and clean.  Follow up in 2 weeks at [facility]. Call with any questions or concerns.   Increase activity slowly as tolerated    Complete by:  As directed    Weight bearing as tolerated with assist device (walker, cane, etc) as directed, use it as long as suggested by your surgeon or therapist, typically at least 4-6 weeks.   TED hose    Complete by:  As directed    Use stockings (TED hose) for 2 weeks on both leg(s).  You may remove them at night for sleeping.      Allergies as of 08/29/2017      Reactions   Tape    STICKY PART ON HEART MONITOR    Codeine Rash   Penicillins Swelling, Rash   PT WAS ABOUT 66 YEARS OLD  Has patient had a PCN reaction causing immediate rash, facial/tongue/throat swelling, SOB or lightheadedness with hypotension: Yes Has patient had a PCN reaction causing severe rash involving mucus membranes or skin necrosis: Unknown Has patient had a PCN reaction that required hospitalization: No Has patient had a PCN reaction occurring within the last 10 years: No If all of the above answers are  "NO", then may proceed with Cephalosporin use.      Medication List    STOP taking these medications   estradiol 0.1 MG/GM vaginal cream Commonly known as:  ESTRACE   Estradiol 10 MCG Tabs vaginal tablet     TAKE these medications   aspirin 81 MG chewable tablet Commonly known as:  ASPIRIN CHILDRENS Chew 1 tablet (81 mg total) by mouth 2 (two) times daily.   docusate sodium 100 MG capsule Commonly known as:  COLACE Take 1 capsule (100 mg total) by mouth 2 (two) times daily.   ferrous sulfate 325 (65 FE) MG tablet Commonly known as:  FERROUSUL Take 1 tablet (325 mg total) by mouth 3 (three) times  daily with meals.   hydrochlorothiazide 25 MG tablet Commonly known as:  HYDRODIURIL Take 25 mg by mouth daily.   HYDROcodone-acetaminophen 7.5-325 MG tablet Commonly known as:  NORCO Take 1-2 tablets by mouth every 4 (four) hours as needed for moderate pain or severe pain.   lisinopril 40 MG tablet Commonly known as:  PRINIVIL,ZESTRIL Take 40 mg by mouth daily.   methocarbamol 500 MG tablet Commonly known as:  ROBAXIN Take 1 tablet (500 mg total) by mouth every 6 (six) hours as needed for muscle spasms.   metoprolol succinate 100 MG 24 hr tablet Commonly known as:  TOPROL-XL Take 100 mg by mouth daily.   multivitamin with minerals Tabs tablet Take 1 tablet by mouth daily.   polyethylene glycol packet Commonly known as:  MIRALAX / GLYCOLAX Take 17 g by mouth 2 (two) times daily. What changed:  when to take this            Discharge Care Instructions        Start     Ordered   08/29/17 0000  Call MD / Call 911    Comments:  If you experience chest pain or shortness of breath, CALL 911 and be transported to the hospital emergency room.  If you develope a fever above 101 F, pus (white drainage) or increased drainage or redness at the wound, or calf pain, call your surgeon's office.   08/29/17 0857   08/29/17 0000  Discharge instructions    Comments:  Maintain  surgical dressing until follow up in the clinic. If the edges start to pull up, may reinforce with tape. If the dressing is no longer working, may remove and cover with gauze and tape, but must keep the area dry and clean.  Follow up in 2 weeks at [facility]. Call with any questions or concerns.   08/29/17 0857   08/29/17 0000  Diet - low sodium heart healthy     08/29/17 0857   08/29/17 0000  Constipation Prevention    Comments:  Drink plenty of fluids.  Prune juice may be helpful.  You may use a stool softener, such as Colace (over the counter) 100 mg twice a day.  Use MiraLax (over the counter) for constipation as needed.   08/29/17 0857   08/29/17 0000  Increase activity slowly as tolerated    Comments:  Weight bearing as tolerated with assist device (walker, cane, etc) as directed, use it as long as suggested by your surgeon or therapist, typically at least 4-6 weeks.   08/29/17 0857   08/29/17 0000  TED hose    Comments:  Use stockings (TED hose) for 2 weeks on both leg(s).  You may remove them at night for sleeping.   08/29/17 0857   08/29/17 0000  Change dressing    Comments:  Maintain surgical dressing until follow up in the clinic. If the edges start to pull up, may reinforce with tape. If the dressing is no longer working, may remove and cover with gauze and tape, but must keep the area dry and clean.  Call with any questions or concerns.   08/29/17 0857   08/28/17 0000  aspirin (ASPIRIN CHILDRENS) 81 MG chewable tablet  2 times daily    Question:  Supervising Provider  Answer:  Paralee Cancel   08/28/17 0913   08/28/17 0000  docusate sodium (COLACE) 100 MG capsule  2 times daily    Question:  Supervising Provider  Answer:  Paralee Cancel  08/28/17 0913   08/28/17 0000  ferrous sulfate (FERROUSUL) 325 (65 FE) MG tablet  3 times daily with meals    Question:  Supervising Provider  Answer:  Paralee Cancel   08/28/17 0913   08/28/17 0000  polyethylene glycol (MIRALAX /  GLYCOLAX) packet  2 times daily    Question:  Supervising Provider  Answer:  Paralee Cancel   08/28/17 0913   08/28/17 0000  methocarbamol (ROBAXIN) 500 MG tablet  Every 6 hours PRN    Question:  Supervising Provider  Answer:  Paralee Cancel   08/28/17 0913   08/28/17 0000  HYDROcodone-acetaminophen (NORCO) 7.5-325 MG tablet  Every 4 hours PRN    Question:  Supervising Provider  Answer:  Paralee Cancel   08/28/17 0913       Signed: West Pugh. Xyon Lukasik   PA-C  08/30/2017, 12:14 PM

## 2017-09-04 ENCOUNTER — Encounter (HOSPITAL_COMMUNITY): Payer: Self-pay | Admitting: Orthopedic Surgery

## 2018-06-17 ENCOUNTER — Other Ambulatory Visit: Payer: Self-pay | Admitting: Internal Medicine

## 2018-06-17 DIAGNOSIS — Z1231 Encounter for screening mammogram for malignant neoplasm of breast: Secondary | ICD-10-CM

## 2018-07-12 ENCOUNTER — Ambulatory Visit
Admission: RE | Admit: 2018-07-12 | Discharge: 2018-07-12 | Disposition: A | Discharge status: Home / Self Care | Payer: Medicare Other | Source: Ambulatory Visit | Attending: Internal Medicine | Admitting: Internal Medicine

## 2018-07-12 ENCOUNTER — Ambulatory Visit: Payer: Medicare Other

## 2018-07-12 DIAGNOSIS — Z1231 Encounter for screening mammogram for malignant neoplasm of breast: Secondary | ICD-10-CM

## 2019-07-01 ENCOUNTER — Other Ambulatory Visit: Payer: Self-pay | Admitting: Internal Medicine

## 2019-07-01 DIAGNOSIS — Z1231 Encounter for screening mammogram for malignant neoplasm of breast: Secondary | ICD-10-CM

## 2019-08-13 ENCOUNTER — Other Ambulatory Visit: Payer: Self-pay

## 2019-08-13 ENCOUNTER — Ambulatory Visit
Admission: RE | Admit: 2019-08-13 | Discharge: 2019-08-13 | Disposition: A | Discharge status: Home / Self Care | Payer: Medicare Other | Source: Ambulatory Visit | Attending: Internal Medicine | Admitting: Internal Medicine

## 2019-08-13 DIAGNOSIS — Z1231 Encounter for screening mammogram for malignant neoplasm of breast: Secondary | ICD-10-CM

## 2020-01-07 ENCOUNTER — Ambulatory Visit: Payer: Medicare PPO | Attending: Nurse Practitioner

## 2020-01-07 DIAGNOSIS — Z23 Encounter for immunization: Secondary | ICD-10-CM | POA: Insufficient documentation

## 2020-01-07 NOTE — Progress Notes (Signed)
   Covid-19 Vaccination Clinic  Name:  Victoria Wallace    MRN: [id number] DOB: 1951/03/14  01/07/2020  [name] was observed post Covid-19 immunization for 15 minutes without incidence. She was provided with Vaccine Information Sheet and instruction to access the V-Safe system.   [name] was instructed to call 911 with any severe reactions post vaccine: Marland Kitchen Difficulty breathing  . Swelling of your face and throat  . A fast heartbeat  . A bad rash all over your body  . Dizziness and weakness    Immunizations Administered    Name Date Dose VIS Date Route   Pfizer COVID-19 Vaccine 01/07/2020 11:57 AM 0.3 mL 11/28/2019 Intramuscular   Manufacturer: [place]   Lot: [id number]   [place]: [id number]

## 2020-01-14 ENCOUNTER — Other Ambulatory Visit: Payer: Self-pay | Admitting: Internal Medicine

## 2020-01-14 DIAGNOSIS — N814 Uterovaginal prolapse, unspecified: Secondary | ICD-10-CM | POA: Insufficient documentation

## 2020-01-14 DIAGNOSIS — I10 Essential (primary) hypertension: Secondary | ICD-10-CM | POA: Insufficient documentation

## 2020-01-14 DIAGNOSIS — M8580 Other specified disorders of bone density and structure, unspecified site: Secondary | ICD-10-CM

## 2020-01-15 ENCOUNTER — Other Ambulatory Visit: Payer: Self-pay

## 2020-01-15 ENCOUNTER — Ambulatory Visit
Admission: RE | Admit: 2020-01-15 | Discharge: 2020-01-15 | Disposition: A | Discharge status: Home / Self Care | Payer: Medicare PPO | Source: Ambulatory Visit | Attending: Internal Medicine | Admitting: Internal Medicine

## 2020-01-15 DIAGNOSIS — M8580 Other specified disorders of bone density and structure, unspecified site: Secondary | ICD-10-CM

## 2020-01-28 ENCOUNTER — Ambulatory Visit: Payer: Medicare Other

## 2020-01-28 ENCOUNTER — Ambulatory Visit: Payer: Medicare PPO | Attending: Internal Medicine

## 2020-01-28 DIAGNOSIS — Z23 Encounter for immunization: Secondary | ICD-10-CM | POA: Insufficient documentation

## 2020-01-28 NOTE — Progress Notes (Signed)
   Covid-19 Vaccination Clinic  Name:  Victoria Wallace    MRN: [id number] DOB: 1951-12-12  01/28/2020  [name] was observed post Covid-19 immunization for 15 minutes without incidence. She was provided with Vaccine Information Sheet and instruction to access the V-Safe system.   [name] was instructed to call 911 with any severe reactions post vaccine: Marland Kitchen Difficulty breathing  . Swelling of your face and throat  . A fast heartbeat  . A bad rash all over your body  . Dizziness and weakness    Immunizations Administered    Name Date Dose VIS Date Route   Pfizer COVID-19 Vaccine 01/28/2020  3:14 PM 0.3 mL 11/28/2019 Intramuscular   Manufacturer: [place]   Lot: [id number]   [place]: [id number]

## 2020-07-06 ENCOUNTER — Other Ambulatory Visit: Payer: Self-pay | Admitting: Internal Medicine

## 2020-07-06 DIAGNOSIS — Z1231 Encounter for screening mammogram for malignant neoplasm of breast: Secondary | ICD-10-CM

## 2020-08-13 ENCOUNTER — Ambulatory Visit
Admission: RE | Admit: 2020-08-13 | Discharge: 2020-08-13 | Disposition: A | Discharge status: Home / Self Care | Payer: Medicare PPO | Source: Ambulatory Visit | Attending: Internal Medicine | Admitting: Internal Medicine

## 2020-08-13 ENCOUNTER — Other Ambulatory Visit: Payer: Self-pay

## 2020-08-13 DIAGNOSIS — Z1231 Encounter for screening mammogram for malignant neoplasm of breast: Secondary | ICD-10-CM

## 2020-11-30 ENCOUNTER — Ambulatory Visit (INDEPENDENT_AMBULATORY_CARE_PROVIDER_SITE_OTHER): Payer: Medicare PPO | Admitting: Ophthalmology

## 2020-11-30 ENCOUNTER — Encounter (INDEPENDENT_AMBULATORY_CARE_PROVIDER_SITE_OTHER): Payer: Self-pay | Admitting: Ophthalmology

## 2020-11-30 ENCOUNTER — Other Ambulatory Visit: Payer: Self-pay

## 2020-11-30 DIAGNOSIS — H43392 Other vitreous opacities, left eye: Secondary | ICD-10-CM

## 2020-11-30 DIAGNOSIS — H43393 Other vitreous opacities, bilateral: Secondary | ICD-10-CM

## 2020-11-30 DIAGNOSIS — H2512 Age-related nuclear cataract, left eye: Secondary | ICD-10-CM

## 2020-11-30 DIAGNOSIS — H43812 Vitreous degeneration, left eye: Secondary | ICD-10-CM

## 2020-11-30 DIAGNOSIS — G43109 Migraine with aura, not intractable, without status migrainosus: Secondary | ICD-10-CM

## 2020-11-30 DIAGNOSIS — H43391 Other vitreous opacities, right eye: Secondary | ICD-10-CM | POA: Diagnosis not present

## 2020-11-30 DIAGNOSIS — H2513 Age-related nuclear cataract, bilateral: Secondary | ICD-10-CM

## 2020-11-30 DIAGNOSIS — H33321 Round hole, right eye: Secondary | ICD-10-CM

## 2020-11-30 DIAGNOSIS — D3131 Benign neoplasm of right choroid: Secondary | ICD-10-CM | POA: Diagnosis not present

## 2020-11-30 DIAGNOSIS — H33329 Round hole, unspecified eye: Secondary | ICD-10-CM | POA: Insufficient documentation

## 2020-11-30 DIAGNOSIS — H2511 Age-related nuclear cataract, right eye: Secondary | ICD-10-CM

## 2020-11-30 NOTE — Assessment & Plan Note (Signed)

## 2020-11-30 NOTE — Assessment & Plan Note (Signed)
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  Photo documentation was discussed with the patient.  Periodic follow-up may be needed for a lifetime. The patient's questions were answered. At minimum, annual exams will be needed if no signs of early growth.  No high risk features, no concerns no change over the years

## 2020-11-30 NOTE — Progress Notes (Signed)
11/30/2020     CHIEF COMPLAINT Patient presents for Retina Follow Up   HISTORY OF PRESENT ILLNESS: Victoria Wallace is a 69 y.o. female who presents to the clinic today for:   HPI    Retina Follow Up    Patient presents with  PVD.  In left eye.  Severity is moderate.  Duration of 1 year.  Since onset it is stable.  I, the attending physician,  performed the HPI with the patient and updated documentation appropriately.          Comments    1 Year f\u OU. FP  Pt states she thinks she had 2 ocular migraines in the past few weeks. Denies any other complaints.        Last edited by Tilda Franco on 11/30/2020  1:24 PM. (History)      Referring physician: Michael Boston, MD [place],  [place] [address]  HISTORICAL INFORMATION:   Selected notes from the [facility]: No current outpatient medications on file. (Ophthalmic Drugs)   No current facility-administered medications for this visit. (Ophthalmic Drugs)   Current Outpatient Medications (Other)  Medication Sig   docusate sodium (COLACE) 100 MG capsule Take 1 capsule (100 mg total) by mouth 2 (two) times daily.   ferrous sulfate (FERROUSUL) 325 (65 FE) MG tablet Take 1 tablet (325 mg total) by mouth 3 (three) times daily with meals.   hydrochlorothiazide (HYDRODIURIL) 25 MG tablet Take 25 mg by mouth daily.   HYDROcodone-acetaminophen (NORCO) 7.5-325 MG tablet Take 1-2 tablets by mouth every 4 (four) hours as needed for moderate pain or severe pain.   lisinopril (PRINIVIL,ZESTRIL) 40 MG tablet Take 40 mg by mouth daily.   methocarbamol (ROBAXIN) 500 MG tablet Take 1 tablet (500 mg total) by mouth every 6 (six) hours as needed for muscle spasms.   metoprolol succinate (TOPROL-XL) 100 MG 24 hr tablet Take 100 mg by mouth daily.   Multiple Vitamin (MULTIVITAMIN WITH MINERALS) TABS tablet Take 1 tablet by mouth daily.   polyethylene glycol (MIRALAX / GLYCOLAX) packet  Take 17 g by mouth 2 (two) times daily. (Patient taking differently: Take 17 g by mouth daily.)   No current facility-administered medications for this visit. (Other)      REVIEW OF SYSTEMS:    ALLERGIES Allergies  Allergen Reactions   Tape     STICKY PART ON HEART MONITOR    Codeine Rash   Penicillins Swelling and Rash    PT WAS ABOUT 69 YEARS OLD  Has patient had a PCN reaction causing immediate rash, facial/tongue/throat swelling, SOB or lightheadedness with hypotension: Yes Has patient had a PCN reaction causing severe rash involving mucus membranes or skin necrosis: Unknown Has patient had a PCN reaction that required hospitalization: No Has patient had a PCN reaction occurring within the last 10 years: No If all of the above answers are "NO", then may proceed with Cephalosporin use.     PAST MEDICAL HISTORY Past Medical History:  Diagnosis Date   Abnormal dexamethasone suppression test 06/2014   recieved info from patient   Arthritis    Asymptomatic microscopic hematuria    monitored by PCP    Chronic constipation    Encounter for hepatitis C screening test for low risk patient 05/18/2017   Flu vaccine 08/13/2017   History of colonoscopy 2012   Hypertension    shingles vaccine 2011   recieved info from  patient   Tetanus toxoid vaccination administered within the past year 05/18/2017   Uterine prolapse    Wears Pessirey   Past Surgical History:  Procedure Laterality Date   BREAST BIOPSY Right    EYE SURGERY     Retinol laser surgery   LUMBAR LAMINECTOMY Bilateral 2002   MOUTH SURGERY     Dental and gum   TOTAL 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   Procedure: RIGHT TOTAL KNEE ARTHROPLASTY;  Surgeon: Paralee Cancel, MD;  Location: [facility];  Service: Orthopedics;  Laterality: Right;  70 mins    FAMILY HISTORY Family History  Problem Relation Age of Onset   Breast cancer Maternal Aunt     SOCIAL HISTORY Social History   Tobacco Use    Smoking status: Never Smoker   Smokeless tobacco: Never Used  Vaping Use   Vaping Use: Never used  Substance Use Topics   Alcohol use: Yes    Alcohol/week: 1.0 standard drink    Types: 1 Glasses of wine per week    Comment: rare   Drug use: No         OPHTHALMIC EXAM:  Base Eye Exam    Visual Acuity (Snellen - Linear)      Right Left   Dist cc 20/20 20/20   Correction: Glasses       Tonometry (Tonopen, 1:32 PM)      Right Left   Pressure 18 15       Pupils      Pupils Dark Light Shape React APD   Right PERRL 4 3 Round Brisk None   Left PERRL 4 3 Round Brisk None       Visual Fields (Counting fingers)      Left Right    Full Full       Neuro/Psych    Oriented x3: Yes   Mood/Affect: Normal       Dilation    Both eyes: 1.0% Mydriacyl, 2.5% Phenylephrine @ 1:32 PM        Slit Lamp and Fundus Exam    External Exam      Right Left   External Normal Normal       Slit Lamp Exam      Right Left   Lids/Lashes Normal Normal   Conjunctiva/Sclera White and quiet White and quiet   Cornea Clear Clear   Anterior Chamber Deep and quiet Deep and quiet   Iris Round and reactive Round and reactive   Lens Nuclear sclerosis Nuclear sclerosis   Anterior Vitreous Normal Normal       Fundus Exam      Right Left   Posterior Vitreous Posterior vitreous detachment Posterior vitreous detachment   Disc Normal Normal   C/D Ratio 0.6 0.6   Macula Normal Normal   Vessels Normal Normal   Periphery Choroidal nevus, 1 disc diameter, flat, 1 disc diameter size inferonasal to the nerve, no change no high risk features,, chorioretinal scarring inferotemporal from laser retinopexy Normal, no holes or tears          IMAGING AND PROCEDURES  Imaging and Procedures for 11/30/20  Color Fundus Photography Optos - OU - Both Eyes       Right Eye Progression has been stable. Disc findings include normal observations. Macula : normal observations. Vessels : normal  observations.   Left Eye Progression has been stable. Disc findings include normal observations. Macula : normal observations. Vessels : normal observations.   Notes Small posterior vitreous detachment, Weiss ring  OU  Small choroidal nevus inferonasal to the optic nerve OD 1 disc diameter in size                ASSESSMENT/PLAN:  Choroidal nevus of right eye The nature of choroidal nevus was discussed with the patient and an informational form was offered.  Photo documentation was discussed with the patient.  Periodic follow-up may be needed for a lifetime. The patient's questions were answered. At minimum, annual exams will be needed if no signs of early growth.  No high risk features, no concerns no change over the years  Nuclear sclerotic cataract of right eye The nature of cataract was discussed with the patient as well as the elective nature of surgery. The patient was reassured that surgery at a later date does not put the patient at risk for a worse outcome. It was emphasized that the need for surgery is dictated by the patient's quality of life as influenced by the cataract. Patient was instructed to maintain close follow up with their general eye care doctor.  Ophthalmic migraine Patient describes recent event that she terms an ophthalmic migraine.  She is probably correct.  Her symptoms are bilateral flashing lights and a kaleidoscope pulsating type fashion lasting for about an hour on the first occasion in the later on 2 to 3 weeks later for shortly duration and concentrated that time mostly in the left eye.  She has had no other intractable headaches no other concomitant headaches  I will share with her that using a magnesium tablet supplement once daily 400 mg can sometimes help stabilize vasculature to help provide try to prevent spasms of the of the vasculature and this in some cases may be helpful  Round hole of retina without detachment No new retinal breaks       ICD-10-CM   1. Posterior vitreous detachment of left eye  H43.812 Color Fundus Photography Optos - OU - Both Eyes  2. Vitreous floaters, left  H43.392   3. Round hole of right eye  H33.321   4. Vitreous floaters, right  H43.391   5. Nuclear sclerotic cataract of right eye  H25.11   6. Nuclear sclerotic cataract of left eye  H25.12   7. Round hole of right retina without detachment  H33.321   8. Choroidal nevus of right eye  D31.31 Color Fundus Photography Optos - OU - Both Eyes  9. 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  Patient encouraged to seek medical evaluation with PCP and/or neurology should her ophthalmic migraine events worsen in severity and/or increase in frequency  2.  No specific ocular therapy warranted at this time  3.  Ophthalmic Meds Ordered this visit:  No orders of the defined types were placed in this encounter.      Return in about 1 year (around 11/30/2021) for DILATE OU, OCT.  There are no Patient Instructions on file for this visit.   Explained the diagnoses, plan, and follow up with the patient and they expressed understanding.  Patient expressed understanding of the importance of proper follow up care.   Clent Demark Jaedyn Marrufo M.D. Diseases & Surgery of the Retina and Vitreous [facility]ina [facility]abetic [place] 11/30/20     Abbreviations: M myopia (nearsighted); A astigmatism; H hyperopia (farsighted); P presbyopia; Mrx spectacle prescription;  CTL contact lenses; OD right eye; OS left eye; OU both eyes  XT exotropia; ET esotropia; PEK punctate epithelial keratitis; PEE punctate epithelial erosions; DES dry eye syndrome; MGD  meibomian gland dysfunction; ATs artificial tears; PFAT's preservative free artificial tears; [place] nuclear sclerotic cataract; PSC posterior subcapsular cataract; ERM epi-retinal membrane; PVD posterior vitreous detachment; RD retinal detachment; DM diabetes mellitus; DR diabetic retinopathy; NPDR non-proliferative diabetic retinopathy; PDR  proliferative diabetic retinopathy; CSME clinically significant macular edema; DME diabetic macular edema; dbh dot blot hemorrhages; CWS cotton wool spot; POAG primary open angle glaucoma; C/D cup-to-disc ratio; HVF humphrey visual field; GVF goldmann visual field; OCT optical coherence tomography; IOP intraocular pressure; BRVO Branch retinal vein occlusion; CRVO central retinal vein occlusion; CRAO central retinal artery occlusion; BRAO branch retinal artery occlusion; RT retinal tear; SB scleral buckle; PPV pars plana vitrectomy; VH Vitreous hemorrhage; PRP panretinal laser photocoagulation; IVK intravitreal kenalog; VMT vitreomacular traction; MH Macular hole;  NVD neovascularization of the disc; NVE neovascularization elsewhere; AREDS age related eye disease study; ARMD age related macular degeneration; POAG primary open angle glaucoma; EBMD epithelial/anterior basement membrane dystrophy; ACIOL anterior chamber intraocular lens; IOL intraocular lens; PCIOL posterior chamber intraocular lens; Phaco/IOL phacoemulsification with intraocular lens placement; [place] photorefractive keratectomy; LASIK laser assisted in situ keratomileusis; HTN hypertension; DM diabetes mellitus; COPD chronic obstructive pulmonary disease

## 2020-11-30 NOTE — Assessment & Plan Note (Signed)
Patient describes recent event that she terms an ophthalmic migraine.  She is probably correct.  Her symptoms are bilateral flashing lights and a kaleidoscope pulsating type fashion lasting for about an hour on the first occasion in the later on 2 to 3 weeks later for shortly duration and concentrated that time mostly in the left eye.  She has had no other intractable headaches no other concomitant headaches  I will share with her that using a magnesium tablet supplement once daily 400 mg can sometimes help stabilize vasculature to help provide try to prevent spasms of the of the vasculature and this in some cases may be helpful

## 2020-11-30 NOTE — Assessment & Plan Note (Signed)
No new retinal breaks 

## 2021-07-04 ENCOUNTER — Other Ambulatory Visit: Payer: Self-pay | Admitting: Internal Medicine

## 2021-07-04 DIAGNOSIS — Z1231 Encounter for screening mammogram for malignant neoplasm of breast: Secondary | ICD-10-CM

## 2021-08-25 ENCOUNTER — Ambulatory Visit
Admission: RE | Admit: 2021-08-25 | Discharge: 2021-08-25 | Disposition: A | Discharge status: Home / Self Care | Payer: Medicare PPO | Source: Ambulatory Visit | Attending: Internal Medicine | Admitting: Internal Medicine

## 2021-08-25 ENCOUNTER — Other Ambulatory Visit: Payer: Self-pay

## 2021-08-25 DIAGNOSIS — Z1231 Encounter for screening mammogram for malignant neoplasm of breast: Secondary | ICD-10-CM

## 2021-08-29 ENCOUNTER — Other Ambulatory Visit: Payer: Self-pay | Admitting: Internal Medicine

## 2021-08-29 DIAGNOSIS — R928 Other abnormal and inconclusive findings on diagnostic imaging of breast: Secondary | ICD-10-CM

## 2021-09-05 ENCOUNTER — Other Ambulatory Visit: Payer: Self-pay

## 2021-09-05 ENCOUNTER — Ambulatory Visit
Admission: RE | Admit: 2021-09-05 | Discharge: 2021-09-05 | Disposition: A | Discharge status: Home / Self Care | Payer: Medicare PPO | Source: Ambulatory Visit | Attending: Internal Medicine | Admitting: Internal Medicine

## 2021-09-05 ENCOUNTER — Other Ambulatory Visit: Payer: Self-pay | Admitting: Internal Medicine

## 2021-09-05 DIAGNOSIS — R921 Mammographic calcification found on diagnostic imaging of breast: Secondary | ICD-10-CM

## 2021-09-05 DIAGNOSIS — R928 Other abnormal and inconclusive findings on diagnostic imaging of breast: Secondary | ICD-10-CM

## 2021-09-14 ENCOUNTER — Ambulatory Visit
Admission: RE | Admit: 2021-09-14 | Discharge: 2021-09-14 | Disposition: A | Discharge status: Home / Self Care | Payer: Medicare PPO | Source: Ambulatory Visit | Attending: Internal Medicine | Admitting: Internal Medicine

## 2021-09-14 ENCOUNTER — Other Ambulatory Visit: Payer: Self-pay

## 2021-09-14 DIAGNOSIS — R921 Mammographic calcification found on diagnostic imaging of breast: Secondary | ICD-10-CM

## 2021-09-30 ENCOUNTER — Other Ambulatory Visit: Payer: Self-pay

## 2021-09-30 ENCOUNTER — Ambulatory Visit: Payer: Medicare PPO | Attending: General Surgery

## 2021-09-30 DIAGNOSIS — D0511 Intraductal carcinoma in situ of right breast: Secondary | ICD-10-CM | POA: Diagnosis present

## 2021-09-30 DIAGNOSIS — R293 Abnormal posture: Secondary | ICD-10-CM | POA: Diagnosis present

## 2021-09-30 NOTE — Patient Instructions (Signed)
Physical Therapy Information for After Breast Cancer Surgery/Treatment:  Lymphedema is a swelling condition that you may be at risk for in your arm if you have lymph nodes removed from the armpit area.  After a sentinel node biopsy, the risk is approximately 5-9% and is higher after an axillary node dissection.  There is treatment available for this condition and it is not life-threatening.  Contact your physician or physical therapist with concerns. You may begin the 4 shoulder/posture exercises (see additional sheet) when permitted by your physician (typically a week after surgery).  If you have drains, you may need to wait until those are removed before beginning range of motion exercises.  A general recommendation is to not lift your arms above shoulder height until drains are removed.  These exercises should be done to your tolerance and gently.  This is not a "no pain/no gain" type of recovery so listen to your body and stretch into the range of motion that you can tolerate, stopping if you have pain.  If you are having immediate reconstruction, ask your plastic surgeon about doing exercises as he or she may want you to wait. We encourage you to attend the free one time ABC (After Breast Cancer) class offered by [facility].  You will learn information related to lymphedema risk, prevention and treatment and additional exercises to regain mobility following surgery.  You can call [phone] for more information.  This is offered the 1st and 3rd Monday of each month.  You only attend the class one time. While undergoing any medical procedure or treatment, try to avoid blood pressure being taken or needle sticks from occurring on the arm on the side of cancer.   This recommendation begins after surgery and continues for the rest of your life.  This may help reduce your risk of getting lymphedema (swelling in your arm). An excellent resource for those seeking information on  lymphedema is the National Lymphedema Network's web site. It can be accessed at [place].org If you notice swelling in your hand, arm or breast at any time following surgery (even if it is many years from now), please contact your doctor or physical therapist to discuss this.  Lymphedema can be treated at any time but it is easier for you if it is treated early on.  If you feel like your shoulder motion is not returning to normal in a reasonable amount of time, please contact your surgeon or physical therapist.  [facility]. [place], [place], [place] [phone]; [address] [place], [place] [address] ABC CLASS After Breast Cancer Class  After Breast Cancer Class is a specially designed exercise class to assist you in a safe recover after having breast cancer surgery.  In this class you will learn how to get back to full function whether your drains were just removed or if you had surgery a month ago.  This one-time class is held the 1st and 3rd Monday of every month from 11:00 a.m. until 12:00 noon and is currently virtual  This class is FREE and space is limited. For more information or to register for the next available class, call [phone].  Class Goals  Understand specific stretches to improve the flexibility of you chest and shoulder. Learn ways to safely strengthen your upper body and improve your posture. Understand the warning signs of infection and why you may be at risk for an arm infection. Learn about Lymphedema and prevention.  ** You do not attend  this class until after surgery.  Drains must be removed to participate  Patient was instructed today in a home exercise program today for post op shoulder range of motion. These included active assist shoulder flexion in sitting/supine, scapular retraction, wall walking with shoulder abduction, and hands behind head external rotation in supine.  She was encouraged to do these twice a day, holding 3 seconds and repeating 5 times when  permitted by her physician.

## 2021-09-30 NOTE — Therapy (Signed)
[facility] @ [place], [place], [address] Phone: [phone]   Fax:  [phone]  Physical Therapy Evaluation  Patient Details  Name: Victoria Wallace MRN: [id number] Date of Birth: 1951/09/08 Referring MD: Dr. Donne Hazel Encounter Date: 09/30/2021   PT End of Session - 09/30/21 1240     Visit Number 1    Number of Visits 2    Date for PT Re-Evaluation 11/25/21    PT Start Time 1105    PT Stop Time 4196    PT Time Calculation (min) 53 min    Activity Tolerance Patient tolerated treatment well    Behavior During Therapy [facility] for tasks assessed/performed             Past Medical History:  Diagnosis Date   Abnormal dexamethasone suppression test 06/2014   recieved info from patient   Arthritis    Asymptomatic microscopic hematuria    monitored by PCP    Chronic constipation    Encounter for hepatitis C screening test for low risk patient 05/18/2017   Flu vaccine 08/13/2017   History of colonoscopy 2012   Hypertension    shingles vaccine 2011   recieved info from patient   Tetanus toxoid vaccination administered within the past year 05/18/2017   Uterine prolapse    Wears [place]    Past Surgical History:  Procedure Laterality Date   BREAST BIOPSY Right    EYE SURGERY     Retinol laser surgery   LUMBAR LAMINECTOMY Bilateral 2002   MOUTH SURGERY     Dental and gum   TOTAL 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   Procedure: RIGHT TOTAL KNEE ARTHROPLASTY;  Surgeon: Paralee Cancel, MD;  Location: [facility];  Service: Orthopedics;  Laterality: Right;  70 mins    There were no vitals filed for this visit.    Subjective Assessment - 09/30/21 1117     Subjective Here for pre-surgical screen    Pertinent History Diagnosed with DCIS.  Scheduled for surgery o 10/20/2021 for Right mastectomy. Won't know until surgery if she will have LN's removed or require other treatment but they don't think so. Right TKA     Patient Stated Goals Gain information from providers, Baselines prior to surgery    Currently in Pain? No/denies    Multiple Pain Sites No                [facility] PT Assessment - 09/30/21 0001       Assessment   Medical Diagnosis Right breast Cancer    Onset Date/Surgical Date 10/20/21    Prior Therapy yes      Precautions   Precaution Comments may be lymphedema      Restrictions   Weight Bearing Restrictions No      Balance Screen   Has the patient fallen in the past 6 months No    Has the patient had a decrease in activity level because of a fear of falling?  No    Is the patient reluctant to leave their home because of a fear of falling?  No      Home Environment   Living Environment Private residence    Living Arrangements Alone    Available Help at Discharge Family;Friend(s)    Type of Downs to enter    [place] Two level      Prior Function   Level of [place]  Vocation Retired   Biochemist, clinical, stationary bike, Nutritional therapist   Overall Cognitive Status Within Functional Limits for tasks assessed      Posture/Postural Control   Posture/Postural Control Postural limitations    Postural Limitations Rounded Shoulders;Forward head      AROM   Right Shoulder Extension 70 Degrees    Right Shoulder Flexion 150 Degrees    Right Shoulder ABduction 176 Degrees    Right Shoulder Internal Rotation 70 Degrees    Right Shoulder External Rotation 104 Degrees    Left Shoulder Extension 61 Degrees    Left Shoulder Flexion 165 Degrees    Left Shoulder ABduction 178 Degrees    Left Shoulder Internal Rotation 85 Degrees    Left Shoulder External Rotation 100 Degrees               LYMPHEDEMA/ONCOLOGY QUESTIONNAIRE - 09/30/21 0001       Type   Cancer Type DCIS      Surgeries   Mastectomy Date 10/20/21      Treatment   Active Chemotherapy Treatment No    Past Chemotherapy Treatment No     Active Radiation Treatment No    Past Radiation Treatment No    Current Hormone Treatment No    Past Hormone Therapy No      What other symptoms do you have   Are you Having Heaviness or Tightness No    Are you having Pain No    Are you having pitting edema No    Is it Hard or Difficult finding clothes that fit No    Do you have infections No    Is there Decreased scar mobility No      Right Upper Extremity Lymphedema   10 cm Proximal to Olecranon Process 33.8 cm    Olecranon Process 25.4 cm    10 cm Proximal to Ulnar Styloid Process 21.1 cm    Just Proximal to Ulnar Styloid Process 15.4 cm      Left Upper Extremity Lymphedema   10 cm Proximal to Olecranon Process 34 cm    Olecranon Process 24.8 cm    10 cm Proximal to Ulnar Styloid Process 21.5 cm    Just Proximal to Ulnar Styloid Process 15.3 cm    At Base of 2nd Digit 5.9 cm             L-DEX FLOWSHEETS - 09/30/21 1100       L-DEX LYMPHEDEMA SCREENING   Measurement Type Unilateral    L-DEX MEASUREMENT EXTREMITY Upper Extremity    POSITION  Standing    DOMINANT SIDE Right    At Risk Side Right    BASELINE SCORE (UNILATERAL) 0.9                  Quick Dash - 09/30/21 0001     Open a tight or new jar Mild difficulty    Do heavy household chores (wash walls, wash floors) Mild difficulty    Carry a shopping bag or briefcase Mild difficulty    Wash your back No difficulty    Use a knife to cut food No difficulty    Recreational activities in which you take some force or impact through your arm, shoulder, or hand (golf, hammering, tennis) No difficulty    During the past week, to what extent has your arm, shoulder or hand problem interfered with your normal social activities with family, friends, neighbors, or groups?  Not at all    During the past week, to what extent has your arm, shoulder or hand problem limited your work or other regular daily activities Not at all    Arm, shoulder, or hand pain. None     Tingling (pins and needles) in your arm, shoulder, or hand None    Difficulty Sleeping No difficulty    DASH Score 6.82 %              Objective measurements completed on examination: See above findings.                PT Education - 09/30/21 1239     Education Details 4 post op exercises, lymphedema, SOZO screens, ABC class    Person(s) Educated Patient    Methods Explanation;Demonstration;Handout    Comprehension Verbalized understanding;Returned demonstration                 PT Long Term Goals - 09/30/21 1246       PT LONG TERM GOAL #1   Title Pt will restore right shoulder AROM to pre-surgical levels    Time 8    Period Weeks    Status New    Target Date 11/25/21             Breast Clinic Goals - 09/30/21 1245       Patient will be able to verbalize understanding of pertinent lymphedema risk reduction practices relevant to her diagnosis specifically related to skin care.   Time 1    Period Days    Status Achieved    Target Date 09/30/21      Patient will be able to return demonstrate and/or verbalize understanding of the post-op home exercise program related to regaining shoulder range of motion.   Time 1    Period Days    Status Achieved    Target Date 09/30/21      Patient will be able to verbalize understanding of the importance of attending the postoperative After Breast Cancer Class for further lymphedema risk reduction education and therapeutic exercise.   Time 1    Period Days    Status Achieved    Target Date 09/30/21                   Plan - 09/30/21 1240     Clinical Impression Statement Pt was seen for pre-surgical screening. AROM of bilateral shoulders was assessed, circumferential measurements taken, SOZO screen performed and pt set up for ABC class.  She was instructed in lymphedema precautions, and follow up re-eval was scheduled. She is pending a right breast mastectomy but does not know if she will have  to have LN's removed as it is DCIS.    Personal Factors and Comorbidities Comorbidity 1    Comorbidities Right breast CAncer, Right TKA    Stability/Clinical Decision Making Stable/Uncomplicated    Clinical Decision Making Low    Rehab Potential Excellent    PT Frequency 1x / week    PT Duration 8 weeks    PT Treatment/Interventions Therapeutic exercise;Patient/family education    PT Next Visit Plan Reassess post surgery, schedule SOZO if LN's removed    PT Home Exercise Plan 4 post op exs    Recommended Other Services ABC class/ SOZO?    Consulted and Agree with Plan of Care Patient           Patient will follow up at outpatient cancer rehab 3-4 weeks following surgery.  If the patient  requires physical therapy at that time, a specific plan will be dictated and sent to the referring physician for approval. The patient was educated today on appropriate basic range of motion exercises to begin post operatively and the importance of attending the After Breast Cancer class following surgery.  Patient was educated today on lymphedema risk reduction practices as it pertains to recommendations that will benefit the patient immediately following surgery.  She verbalized good understanding.   The patient was assessed using the L-Dex machine today to produce a lymphedema index baseline score. The patient will be reassessed on a regular basis (typically every 3 months) to obtain new L-Dex scores. If the score is > 6.5 points away from his/her baseline score indicating onset of subclinical lymphedema, it will be recommended to wear a compression garment for 4 weeks, 12 hours per day and then be reassessed. If the score continues to be > 6.5 points from baseline at reassessment, we will initiate lymphedema treatment. Assessing in this manner has a 95% rate of preventing clinically significant lymphedema.   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  Abnormal posture     Problem List Patient Active Problem List   Diagnosis Date Noted   Posterior vitreous detachment of left eye 11/30/2020   Vitreous floaters, left 11/30/2020   Round hole of right eye 11/30/2020   Vitreous floaters, right 11/30/2020   Nuclear sclerotic cataract of right eye 11/30/2020   Nuclear sclerotic cataract of left eye 11/30/2020   Round hole of retina without detachment 11/30/2020   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

## 2021-10-12 ENCOUNTER — Encounter (HOSPITAL_BASED_OUTPATIENT_CLINIC_OR_DEPARTMENT_OTHER)
Admission: RE | Admit: 2021-10-12 | Discharge: 2021-10-12 | Disposition: A | Discharge status: Home / Self Care | Payer: Medicare PPO | Source: Ambulatory Visit | Attending: General Surgery | Admitting: General Surgery

## 2021-10-12 ENCOUNTER — Encounter (HOSPITAL_BASED_OUTPATIENT_CLINIC_OR_DEPARTMENT_OTHER): Payer: Self-pay | Admitting: General Surgery

## 2021-10-12 DIAGNOSIS — Z01818 Encounter for other preprocedural examination: Secondary | ICD-10-CM | POA: Insufficient documentation

## 2021-10-12 LAB — BASIC METABOLIC PANEL
Anion gap: 9 (ref 5–15)
BUN: 20 mg/dL (ref 8–23)
CO2: 28 mmol/L (ref 22–32)
Calcium: 10.1 mg/dL (ref 8.9–10.3)
Chloride: 99 mmol/L (ref 98–111)
Creatinine, Ser: 0.69 mg/dL (ref 0.44–1.00)
GFR, Estimated: >60 mL/min (ref >60)
Glucose, Bld: 115 mg/dL — ABNORMAL HIGH (ref 70–99)
Potassium: 4 mmol/L (ref 3.5–5.1)
Sodium: 136 mmol/L (ref 135–145)

## 2021-10-20 ENCOUNTER — Ambulatory Visit (HOSPITAL_BASED_OUTPATIENT_CLINIC_OR_DEPARTMENT_OTHER): Payer: Medicare PPO | Admitting: Anesthesiology

## 2021-10-20 ENCOUNTER — Encounter (HOSPITAL_BASED_OUTPATIENT_CLINIC_OR_DEPARTMENT_OTHER): Payer: Self-pay | Admitting: General Surgery

## 2021-10-20 ENCOUNTER — Encounter (HOSPITAL_BASED_OUTPATIENT_CLINIC_OR_DEPARTMENT_OTHER)
Admission: RE | Disposition: A | Discharge status: Home / Self Care | Payer: Self-pay | Source: Ambulatory Visit | Attending: General Surgery

## 2021-10-20 ENCOUNTER — Observation Stay (HOSPITAL_BASED_OUTPATIENT_CLINIC_OR_DEPARTMENT_OTHER)
Admission: RE | Admit: 2021-10-20 | Discharge: 2021-10-21 | Disposition: A | Discharge status: Home / Self Care | Payer: Medicare PPO | Source: Ambulatory Visit | Attending: General Surgery | Admitting: General Surgery

## 2021-10-20 ENCOUNTER — Other Ambulatory Visit: Payer: Self-pay

## 2021-10-20 DIAGNOSIS — D0511 Intraductal carcinoma in situ of right breast: Secondary | ICD-10-CM | POA: Diagnosis not present

## 2021-10-20 DIAGNOSIS — I10 Essential (primary) hypertension: Secondary | ICD-10-CM | POA: Insufficient documentation

## 2021-10-20 DIAGNOSIS — Z96651 Presence of right artificial knee joint: Secondary | ICD-10-CM | POA: Diagnosis not present

## 2021-10-20 DIAGNOSIS — Z79899 Other long term (current) drug therapy: Secondary | ICD-10-CM | POA: Insufficient documentation

## 2021-10-20 DIAGNOSIS — Z9011 Acquired absence of right breast and nipple: Secondary | ICD-10-CM

## 2021-10-20 HISTORY — PX: TOTAL MASTECTOMY: SHX6129

## 2021-10-20 SURGERY — MASTECTOMY, SIMPLE
Anesthesia: General | Site: Breast | Laterality: Right

## 2021-10-20 MED ORDER — ONDANSETRON HCL 4 MG/2ML IJ SOLN: 4.0000 mg | Freq: Four times a day (QID) | INTRAMUSCULAR | Status: DC | PRN

## 2021-10-20 MED ORDER — DEXAMETHASONE SODIUM PHOSPHATE 4 MG/ML IJ SOLN
INTRAMUSCULAR | Status: DC | PRN
  Administered 2021-10-20: 5 mg via INTRAVENOUS

## 2021-10-20 MED ORDER — AMISULPRIDE (ANTIEMETIC) 5 MG/2ML IV SOLN: 10.0000 mg | Freq: Once | INTRAVENOUS | Status: DC | PRN

## 2021-10-20 MED ORDER — OXYCODONE HCL 5 MG PO TABS: 5.0000 mg | ORAL_TABLET | Freq: Once | ORAL | Status: DC | PRN

## 2021-10-20 MED ORDER — CHLORHEXIDINE GLUCONATE CLOTH 2 % EX PADS: 6.0000 | MEDICATED_PAD | Freq: Once | CUTANEOUS | Status: DC

## 2021-10-20 MED ORDER — ONDANSETRON HCL 4 MG/2ML IJ SOLN
INTRAMUSCULAR | Status: AC
  Filled 2021-10-20: qty 2

## 2021-10-20 MED ORDER — LIDOCAINE HCL (CARDIAC) PF 100 MG/5ML IV SOSY
PREFILLED_SYRINGE | INTRAVENOUS | Status: DC | PRN
  Administered 2021-10-20: 40 mg via INTRAVENOUS

## 2021-10-20 MED ORDER — MORPHINE SULFATE (PF) 4 MG/ML IV SOLN: 1.0000 mg | INTRAVENOUS | Status: DC | PRN

## 2021-10-20 MED ORDER — FENTANYL CITRATE (PF) 100 MCG/2ML IJ SOLN
INTRAMUSCULAR | Status: AC
  Filled 2021-10-20: qty 2

## 2021-10-20 MED ORDER — SIMETHICONE 80 MG PO CHEW: 40.0000 mg | CHEWABLE_TABLET | Freq: Four times a day (QID) | ORAL | Status: DC | PRN

## 2021-10-20 MED ORDER — FENTANYL CITRATE (PF) 100 MCG/2ML IJ SOLN
INTRAMUSCULAR | Status: DC | PRN
  Administered 2021-10-20 (×2): 50 ug via INTRAVENOUS

## 2021-10-20 MED ORDER — HYDROMORPHONE HCL 1 MG/ML IJ SOLN
0.2500 mg | INTRAMUSCULAR | Status: DC | PRN
  Administered 2021-10-20 (×2): 0.5 mg via INTRAVENOUS

## 2021-10-20 MED ORDER — SODIUM CHLORIDE 0.9 % IV SOLN: INTRAVENOUS | Status: DC

## 2021-10-20 MED ORDER — PROMETHAZINE HCL 25 MG/ML IJ SOLN: 6.2500 mg | INTRAMUSCULAR | Status: DC | PRN

## 2021-10-20 MED ORDER — LISINOPRIL 40 MG PO TABS
40.0000 mg | ORAL_TABLET | Freq: Every day | ORAL | Status: DC
  Filled 2021-10-20: qty 1

## 2021-10-20 MED ORDER — METHOCARBAMOL 500 MG PO TABS
500.0000 mg | ORAL_TABLET | Freq: Four times a day (QID) | ORAL | Status: DC | PRN
  Administered 2021-10-20 – 2021-10-21 (×3): 500 mg via ORAL
  Filled 2021-10-20 (×3): qty 1

## 2021-10-20 MED ORDER — DEXAMETHASONE SODIUM PHOSPHATE 10 MG/ML IJ SOLN
INTRAMUSCULAR | Status: AC
  Filled 2021-10-20: qty 1

## 2021-10-20 MED ORDER — FENTANYL CITRATE (PF) 100 MCG/2ML IJ SOLN
50.0000 ug | Freq: Once | INTRAMUSCULAR | Status: AC
  Administered 2021-10-20: 50 ug via INTRAVENOUS

## 2021-10-20 MED ORDER — EPHEDRINE SULFATE 50 MG/ML IJ SOLN
INTRAMUSCULAR | Status: DC | PRN
  Administered 2021-10-20: 20 mg via INTRAVENOUS

## 2021-10-20 MED ORDER — HYDROMORPHONE HCL 1 MG/ML IJ SOLN
INTRAMUSCULAR | Status: AC
  Filled 2021-10-20: qty 0.5

## 2021-10-20 MED ORDER — OXYCODONE HCL 5 MG PO TABS
5.0000 mg | ORAL_TABLET | ORAL | Status: DC | PRN
  Administered 2021-10-20: 5 mg via ORAL
  Filled 2021-10-20: qty 1

## 2021-10-20 MED ORDER — ONDANSETRON 4 MG PO TBDP: 4.0000 mg | ORAL_TABLET | Freq: Four times a day (QID) | ORAL | Status: DC | PRN

## 2021-10-20 MED ORDER — MIDAZOLAM HCL 2 MG/2ML IJ SOLN
2.0000 mg | Freq: Once | INTRAMUSCULAR | Status: AC
  Administered 2021-10-20: 2 mg via INTRAVENOUS

## 2021-10-20 MED ORDER — CIPROFLOXACIN IN D5W 400 MG/200ML IV SOLN
400.0000 mg | INTRAVENOUS | Status: AC
  Administered 2021-10-20: 400 mg via INTRAVENOUS

## 2021-10-20 MED ORDER — ROPIVACAINE HCL 5 MG/ML IJ SOLN
INTRAMUSCULAR | Status: DC | PRN
  Administered 2021-10-20: 30 mL via PERINEURAL

## 2021-10-20 MED ORDER — LACTATED RINGERS IV SOLN: INTRAVENOUS | Status: DC

## 2021-10-20 MED ORDER — LISINOPRIL 20 MG PO TABS
40.0000 mg | ORAL_TABLET | Freq: Every day | ORAL | Status: DC
  Administered 2021-10-20: 40 mg via ORAL
  Filled 2021-10-20: qty 2

## 2021-10-20 MED ORDER — LIDOCAINE 2% (20 MG/ML) 5 ML SYRINGE
INTRAMUSCULAR | Status: AC
  Filled 2021-10-20: qty 5

## 2021-10-20 MED ORDER — ACETAMINOPHEN 500 MG PO TABS
1000.0000 mg | ORAL_TABLET | Freq: Four times a day (QID) | ORAL | Status: DC
  Administered 2021-10-20 – 2021-10-21 (×3): 1000 mg via ORAL
  Filled 2021-10-20 (×3): qty 2

## 2021-10-20 MED ORDER — ACETAMINOPHEN 500 MG PO TABS
ORAL_TABLET | ORAL | Status: AC
  Filled 2021-10-20: qty 2

## 2021-10-20 MED ORDER — ACETAMINOPHEN 500 MG PO TABS
1000.0000 mg | ORAL_TABLET | ORAL | Status: AC
  Administered 2021-10-20: 1000 mg via ORAL

## 2021-10-20 MED ORDER — PROPOFOL 10 MG/ML IV BOLUS
INTRAVENOUS | Status: AC
  Filled 2021-10-20: qty 20

## 2021-10-20 MED ORDER — METOPROLOL SUCCINATE ER 100 MG PO TB24: 100.0000 mg | ORAL_TABLET | Freq: Every day | ORAL | Status: DC

## 2021-10-20 MED ORDER — OXYCODONE HCL 5 MG/5ML PO SOLN: 5.0000 mg | Freq: Once | ORAL | Status: DC | PRN

## 2021-10-20 MED ORDER — EPHEDRINE 5 MG/ML INJ
INTRAVENOUS | Status: AC
  Filled 2021-10-20: qty 5

## 2021-10-20 MED ORDER — HYDROCHLOROTHIAZIDE 25 MG PO TABS
25.0000 mg | ORAL_TABLET | Freq: Every day | ORAL | Status: DC
  Administered 2021-10-20: 25 mg via ORAL
  Filled 2021-10-20: qty 1

## 2021-10-20 MED ORDER — PROPOFOL 10 MG/ML IV BOLUS
INTRAVENOUS | Status: DC | PRN
  Administered 2021-10-20: 200 mg via INTRAVENOUS

## 2021-10-20 MED ORDER — METOPROLOL SUCCINATE ER 100 MG PO TB24
100.0000 mg | ORAL_TABLET | Freq: Every day | ORAL | Status: DC
  Filled 2021-10-20: qty 1

## 2021-10-20 MED ORDER — MIDAZOLAM HCL 2 MG/2ML IJ SOLN
INTRAMUSCULAR | Status: AC
  Filled 2021-10-20: qty 2

## 2021-10-20 MED ORDER — CIPROFLOXACIN IN D5W 400 MG/200ML IV SOLN
INTRAVENOUS | Status: AC
  Filled 2021-10-20: qty 200

## 2021-10-20 SURGICAL SUPPLY — 48 items
ADH SKN CLS APL DERMABOND .7 (GAUZE/BANDAGES/DRESSINGS) ×1
APL PRP STRL LF DISP 70% ISPRP (MISCELLANEOUS) ×1
APPLIER CLIP 11 MED OPEN (CLIP) ×2
APPLIER CLIP 9.375 MED OPEN (MISCELLANEOUS) ×2
APR CLP MED 11 20 MLT OPN (CLIP) ×1
APR CLP MED 9.3 20 MLT OPN (MISCELLANEOUS) ×1
BIOPATCH RED 1 DISK 7.0 (GAUZE/BANDAGES/DRESSINGS) ×2 IMPLANT
BLADE SURG 10 STRL SS (BLADE) ×2 IMPLANT
BLADE SURG 15 STRL LF DISP TIS (BLADE) ×1 IMPLANT
BLADE SURG 15 STRL SS (BLADE) ×2
CANISTER SUCT 1200ML W/VALVE (MISCELLANEOUS) ×2 IMPLANT
CHLORAPREP W/TINT 26 (MISCELLANEOUS) ×2 IMPLANT
CLIP APPLIE 11 MED OPEN (CLIP) ×1 IMPLANT
CLIP APPLIE 9.375 MED OPEN (MISCELLANEOUS) ×1 IMPLANT
COVER BACK TABLE 60X90IN (DRAPES) ×2 IMPLANT
COVER MAYO STAND STRL (DRAPES) ×2 IMPLANT
DERMABOND ADVANCED (GAUZE/BANDAGES/DRESSINGS) ×1
DERMABOND ADVANCED .7 DNX12 (GAUZE/BANDAGES/DRESSINGS) ×1 IMPLANT
DRAIN CHANNEL 19F RND (DRAIN) ×2 IMPLANT
DRAPE TOP ARMCOVERS (MISCELLANEOUS) ×2 IMPLANT
DRAPE U-SHAPE 76X120 STRL (DRAPES) ×2 IMPLANT
DRAPE UTILITY XL STRL (DRAPES) ×2 IMPLANT
DRSG PAD ABDOMINAL 8X10 ST (GAUZE/BANDAGES/DRESSINGS) ×4 IMPLANT
DRSG TEGADERM 4X4.75 (GAUZE/BANDAGES/DRESSINGS) ×2 IMPLANT
ELECT REM PT RETURN 9FT ADLT (ELECTROSURGICAL) ×2
ELECTRODE REM PT RTRN 9FT ADLT (ELECTROSURGICAL) ×1 IMPLANT
EVACUATOR SILICONE 100CC (DRAIN) ×2 IMPLANT
GLOVE SURG ENC MOIS LTX SZ7 (GLOVE) ×2 IMPLANT
GLOVE SURG UNDER POLY LF SZ7.5 (GLOVE) ×2 IMPLANT
GOWN STRL REUS W/ TWL LRG LVL3 (GOWN DISPOSABLE) ×2 IMPLANT
GOWN STRL REUS W/TWL LRG LVL3 (GOWN DISPOSABLE) ×4
HEMOSTAT ARISTA ABSORB 1G (HEMOSTASIS) ×4 IMPLANT
NS IRRIG 1000ML POUR BTL (IV SOLUTION) ×2 IMPLANT
PACK BASIN DAY SURGERY FS (CUSTOM PROCEDURE TRAY) ×2 IMPLANT
PENCIL SMOKE EVACUATOR (MISCELLANEOUS) ×2 IMPLANT
PIN SAFETY STERILE (MISCELLANEOUS) ×2 IMPLANT
SHEET MEDIUM DRAPE 40X70 STRL (DRAPES) ×2 IMPLANT
SLEEVE SCD COMPRESS KNEE MED (STOCKING) ×2 IMPLANT
SPONGE T-LAP 18X18 ~~LOC~~+RFID (SPONGE) ×4 IMPLANT
STRIP CLOSURE SKIN 1/2X4 (GAUZE/BANDAGES/DRESSINGS) ×4 IMPLANT
SUT ETHILON 2 0 FS 18 (SUTURE) ×2 IMPLANT
SUT ETHILON 3 0 PS 1 (SUTURE) ×2 IMPLANT
SUT MNCRL AB 4-0 PS2 18 (SUTURE) ×4 IMPLANT
SUT SILK 2 0 SH (SUTURE) ×2 IMPLANT
SUT VICRYL 3-0 CR8 SH (SUTURE) ×2 IMPLANT
TOWEL GREEN STERILE FF (TOWEL DISPOSABLE) ×4 IMPLANT
TUBE CONNECTING 20X1/4 (TUBING) ×2 IMPLANT
YANKAUER SUCT BULB TIP NO VENT (SUCTIONS) ×2 IMPLANT

## 2021-10-20 NOTE — Op Note (Signed)
Preoperative diagnosis: Right breast ductal carcinoma in situ Postoperative diagnosis: Same as above Procedure: 1.  Right mastectomy 2.  Injection of mag trace for possible future sentinel node biopsy Surgeon: Dr. Serita Grammes Anesthesia: General with a pectoral block Estimated blood loss: 25 cc Specimens: Right breast tissue marked short stitch superior, long stitch lateral Drains: 19 [place] Blake drain to mastectomy space Complications: None Sponge and count was correct completion Disposition to recovery stable condition  Indications: This is a 69 year old female who presents after a screening mammogram showed 2 separate areas of right breast calcifications.  There is a total of an 8 cm areas these are flanked by additional calcifications.  Biopsy of the upper outer area is high-grade ductal carcinoma in situ that is ER/PR positive.  Biopsy of the posterior areas low-grade ductal carcinoma in situ that is ER/PR positive.  We discussed all of her options and she would like to just proceed with a mastectomy.  She did not desire reconstruction  Procedure: After informed consent was obtained the patient was taken to the operating room.  She had undergone a pectoral block.  Prior to beginning I prepped the area around the areola and injected 2 cc of mag trace in the subareolar position.  This was done for a possible delayed sentinel lymph node biopsy pending her final pathology.  She then was given antibiotics.  SCDs were placed.  She was placed under general esthesia without complication.  She was prepped and draped in the standard sterile surgical fashion.  I marked her for a reduction pattern incision.  I then made an incision removing the nipple and the areola and a fair amount of her skin.  I created flaps to the sternum, clavicle, latissimus, and inframammary fold.  The breast and the fascia were then removed from the muscle and passed off the table.  I obtained hemostasis.  I did place some  Arista in the cavity.  I placed a 79 [place] Blake drain and secured this with a 2-0 nylon suture.  I then closed the lateral portion down to the chest wall with Vicryl suture.  I then closed the incision with 3-0 Vicryl and 4-0 Monocryl.  I placed a 3-0 nylon suture at the T-junction.  I then placed glue and Steri-Strips.  She tolerated this well was extubated transferred to recovery stable.

## 2021-10-20 NOTE — Transfer of Care (Signed)
Immediate Anesthesia Transfer of Care Note  Patient: Victoria Wallace  Procedure(s) Performed: RIGHT TOTAL MASTECTOMY (Right: Breast)  Patient Location: PACU  Anesthesia Type:GA combined with regional for post-op pain  Level of Consciousness: sedated  Airway & Oxygen Therapy: Patient Spontanous Breathing and Patient connected to face mask oxygen  Post-op Assessment: Report given to RN and Post -op Vital signs reviewed and stable  Post vital signs: Reviewed and stable  Last Vitals:  Vitals Value Taken Time  BP 114/66 10/20/21 1406  Temp    Pulse 72 10/20/21 1408  Resp 31 10/20/21 1408  SpO2 100 % 10/20/21 1408  Vitals shown include unvalidated device data.  Last Pain:  Vitals:   10/20/21 1111  TempSrc: Oral  PainSc: 0-No pain         Complications: No notable events documented.

## 2021-10-20 NOTE — Anesthesia Preprocedure Evaluation (Signed)
Anesthesia Evaluation  Patient identified by MRN, date of birth, ID band Patient awake    Reviewed: Allergy & Precautions, NPO status , Patient's Chart, lab work & pertinent test results, reviewed documented beta blocker date and time   Airway Mallampati: I       Dental no notable dental hx. (+) Teeth Intact   Pulmonary neg pulmonary ROS,    Pulmonary exam normal breath sounds clear to auscultation       Cardiovascular hypertension, Pt. on medications and Pt. on home beta blockers Normal cardiovascular exam Rhythm:Regular Rate:Normal     Neuro/Psych  Headaches, negative psych ROS   GI/Hepatic negative GI ROS, Neg liver ROS,   Endo/Other  negative endocrine ROS  Renal/GU negative Renal ROS  negative genitourinary   Musculoskeletal  (+) Arthritis , Osteoarthritis,    Abdominal Normal abdominal exam  (+)   Peds  Hematology negative hematology ROS (+)   Anesthesia Other Findings Breast Cancer  Reproductive/Obstetrics                             Anesthesia Physical  Anesthesia Plan  ASA: 3  Anesthesia Plan: General   Post-op Pain Management:  Regional for Post-op pain   Induction: Intravenous  PONV Risk Score and Plan: 3 and Ondansetron, Dexamethasone, Midazolam and Treatment may vary due to age or medical condition  Airway Management Planned: LMA  Additional Equipment:   Intra-op Plan:   Post-operative Plan: Extubation in OR  Informed Consent: I have reviewed the patients History and Physical, chart, labs and discussed the procedure including the risks, benefits and alternatives for the proposed anesthesia with the patient or authorized representative who has indicated his/her understanding and acceptance.       Plan Discussed with: CRNA and Surgeon  Anesthesia Plan Comments:         Anesthesia Quick Evaluation

## 2021-10-20 NOTE — Anesthesia Procedure Notes (Signed)
Anesthesia Regional Block: Pectoralis block   Pre-Anesthetic Checklist: , timeout performed,  Correct Patient, Correct Site, Correct Laterality,  Correct Procedure, Correct Position, site marked,  Risks and benefits discussed,  Surgical consent,  Pre-op evaluation,  At surgeon's request and post-op pain management  Laterality: Right  Prep: chloraprep       Needles:  Injection technique: Single-shot  Needle Type: Stimiplex     Needle Length: 9cm  Needle Gauge: 21     Additional Needles:   Procedures:,,,, ultrasound used (permanent image in chart),,    Narrative:  Start time: 10/20/2021 11:42 AM End time: 10/20/2021 11:47 AM Injection made incrementally with aspirations every 5 mL.  Performed by: Personally  Anesthesiologist: Lynda Rainwater, MD

## 2021-10-20 NOTE — Anesthesia Postprocedure Evaluation (Signed)
Anesthesia Post Note  Patient: JAQUISHA FRECH  Procedure(s) Performed: RIGHT TOTAL MASTECTOMY (Right: Breast)     Patient location during evaluation: PACU Anesthesia Type: General Level of consciousness: awake and alert Pain management: pain level controlled Vital Signs Assessment: post-procedure vital signs reviewed and stable Respiratory status: spontaneous breathing, nonlabored ventilation and respiratory function stable Cardiovascular status: blood pressure returned to baseline and stable Postop Assessment: no apparent nausea or vomiting Anesthetic complications: no   No notable events documented.  Last Vitals:  Vitals:   10/20/21 1459 10/20/21 1500  BP: 118/66   Pulse: 72   Resp: 16   Temp:    SpO2: 97% 97%    Last Pain:  Vitals:   10/20/21 1459  TempSrc:   PainSc: 2                  Lynda Rainwater

## 2021-10-20 NOTE — Progress Notes (Signed)
Assisted Dr. Miller with right, ultrasound guided, pectoralis block. Side rails up, monitors on throughout procedure. See vital signs in flow sheet. Tolerated Procedure well. 

## 2021-10-20 NOTE — Anesthesia Procedure Notes (Signed)
Procedure Name: LMA Insertion Date/Time: 10/20/2021 1:10 PM Performed by: Tawni Millers, CRNA Pre-anesthesia Checklist: Patient identified, Emergency Drugs available, Suction available and Patient being monitored Patient Re-evaluated:Patient Re-evaluated prior to induction Oxygen Delivery Method: Circle system utilized Preoxygenation: Pre-oxygenation with 100% oxygen Induction Type: IV induction Ventilation: Mask ventilation without difficulty LMA: LMA inserted LMA Size: 4.0 Number of attempts: 1 Airway Equipment and Method: Bite block Placement Confirmation: positive ETCO2 Tube secured with: Tape Dental Injury: Teeth and Oropharynx as per pre-operative assessment

## 2021-10-20 NOTE — H&P (Signed)
70 y.o. female who is seen today as an office consultation at the request of Dr. Jacalyn Lefevre for evaluation of Breast Cancer She has no prior history. Of breast cancer. She has had a biopsy in 2006 on the right side that was negative. She has no mass or discharge. She has a family history in a maternal aunt of breast cancer as well as a niece with uterine cancer. She underwent her screening mammogram that shows B density breast. There are 2 separate areas of right breast calcifications in the upper outer middle breast measuring 2.9 cm in the upper outer posterior breast measuring 3 cm. I discussed this with the radiologist and there are calcifications linking both of them as well making this an 8 cm total area. Biopsy of the upper outer middle area is high-grade DCIS that is 100% ER positive, 60% PR positive. The biopsy of the posterior area is low-grade DCIS that is ER/PR positive and arising in a complex sclerosing lesion. She lives alone and is retired. She comes in today to discuss her options.  Review of Systems: A complete review of systems was obtained from the patient. I have reviewed this information and discussed as appropriate with the patient. See HPI as well for other ROS.  Review of Systems  All other systems reviewed and are negative.   Medical History: Past Medical History:  Diagnosis Date   Arthritis   History of cancer   Hypertension   There is no problem list on file for this patient.  Past Surgical History:  Procedure Laterality Date   laser retina repair   lumbar diskectomy N/A   right knee replacement    Allergies  Allergen Reactions   Penicillins Hives, Rash and Swelling  PT WAS ABOUT 70 YEARS OLD  Has patient had a PCN reaction causing immediate rash, facial/tongue/throat swelling, SOB or lightheadedness with hypotension: Yes Has patient had a PCN reaction causing severe rash involving mucus membranes or skin necrosis: Unknown Has patient had a PCN reaction that  required hospitalization: No Has patient had a PCN reaction occurring within the last 10 years: No If all of the above answers are "NO", then may proceed with Cephalosporin use.   Adhesive Tape-Silicones Unknown  STICKY PART ON HEART MONITOR  STICKY PART ON HEART MONITOR   Codeine Rash   Current Outpatient Medications on File Prior to Visit  Medication Sig Dispense Refill   estradioL (ESTRACE) 0.01 % (0.1 mg/gram) vaginal cream estradiol 0.01% (0.1 mg/gram) vaginal cream INSERT 1 APPLICATORFUL VAGINALLY 2 TO 3 TIMES PER WEEK AND / OR SMALL AMOUNTS EXTERNALLY 2 TO 3 TIMES PER WEEK   estradioL (VAGIFEM) 10 mcg vaginal tablet INSERT 1 TABLET INTRAVAGINALLY 3 TIMES EVERY WEEK   hydroCHLOROthiazide (HYDRODIURIL) 25 MG tablet hydrochlorothiazide 25 mg tablet TK 1 T PO QD   lisinopriL (ZESTRIL) 40 MG tablet Take 40 mg by mouth once daily   metoprolol succinate (TOPROL-XL) 100 MG XL tablet Take 100 mg by mouth once daily   triamcinolone 0.1 % cream APPLY TO SKIN TWICE DAILY FOR 14 DAYS   No current facility-administered medications on file prior to visit.   Family History  Problem Relation Age of Onset   Skin cancer Mother   High blood pressure (Hypertension) Mother   High blood pressure (Hypertension) Father   Heart valve disease Father    Social History   Tobacco Use  Smoking Status Never Smoker  Smokeless Tobacco Never Used    Social History   Socioeconomic History  Marital status: Widowed  Tobacco Use   Smoking status: Never Smoker   Smokeless tobacco: Never Used  Vaping Use   Vaping Use: Never used  Substance and Sexual Activity   Alcohol use: Never   Drug use: Never   Objective:   Vitals:  09/22/21 1351  Pulse: 91  Temp: 36.9 C (98.5 F)  SpO2: 99%  Weight: 93.8 kg (206 lb 12.8 oz)  Height: 177.8 cm (5\' 10" )   Body mass index is 29.67 kg/m.  Physical Exam Constitutional:  Appearance: Normal appearance.  Chest:  Breasts:  Right: No inverted nipple,  mass or nipple discharge.  Left: No inverted nipple, mass or nipple discharge.  Comments: Right lateral hematoma Lymphadenopathy:  Upper Body:  Right upper body: No supraclavicular or axillary adenopathy.  Left upper body: No axillary adenopathy.  Neurological:  Mental Status: She is alert.    Assessment and Plan:   Ductal carcinoma in situ (DCIS) of right breast Right mastectomy, magtrace injection  We discussed the staging and pathophysiology of breast cancer. We discussed all of the different options for treatment for breast cancer including surgery, chemotherapy, radiation therapy, Herceptin, and antiestrogen therapy. I do not think she needs a sentinel lymph node biopsy at this time. However we did discuss injection of mag trace at the time I see her to do her surgery just in case she needs a later sentinel lymph node biopsy. We discussed the options for treatment of the breast cancer which included lumpectomy versus a mastectomy. We discussed the performance of a bracketed lumpectomy with radioactive seed placement. We discussed a 10% chance of a positive margin requiring reexcision in the operating room. We also discussed that she will likely need radiation therapy if she undergoes lumpectomy. We discussed mastectomy and the postoperative care for that as well. Mastectomy can be followed by reconstruction. The decision for lumpectomy vs mastectomy has no impact on decision for chemotherapy. Most mastectomy patients will not need radiation therapy. We discussed that there is no difference in her survival whether she undergoes lumpectomy with radiation therapy or antiestrogen therapy versus a mastectomy. There is also no real difference between her recurrence in the breast. She would very much like to proceed with mastectomy after this discussion. She does not want to do reconstruction. I will plan on scheduling her soon. We discussed the risks of operation including bleeding, infection,  possible reoperation. She understands her further therapy will be based on what her stages at the time of her operation.

## 2021-10-20 NOTE — Discharge Instructions (Signed)
[facility], [place] [phone]  MASTECTOMY: POST OP INSTRUCTIONS Take 400 mg of ibuprofen every 8 hours or 650 mg tylenol every 6 hours for next 72 hours then as needed. Use ice several times daily also. Always review your discharge instruction sheet given to you by the facility where your surgery was performed. IF YOU HAVE DISABILITY OR FAMILY LEAVE FORMS, YOU MUST BRING THEM TO THE OFFICE FOR PROCESSING.   DO NOT GIVE THEM TO YOUR DOCTOR. A prescription for pain medication may be given to you upon discharge.  Take your pain medication as prescribed, if needed.  If narcotic pain medicine is not needed, then you may take acetaminophen (Tylenol), naprosyn (Alleve) or ibuprofen (Advil) as needed. Take your usually prescribed medications unless otherwise directed. If you need a refill on your pain medication, please contact your pharmacy.  They will contact our office to request authorization.  Prescriptions will not be filled after 5pm or on week-ends. You should follow a light diet the first few days after arrival home, such as soup and crackers, etc.  Resume your normal diet the day after surgery. Most patients will experience some swelling and bruising on the chest and underarm.  Ice packs will help.  Swelling and bruising can take several days to resolve. Wear the binder day and night until you return to the office.  It is common to experience some constipation if taking pain medication after surgery.  Increasing fluid intake and taking a stool softener (such as Colace) will usually help or prevent this problem from occurring.  A mild laxative (Milk of Magnesia or Miralax) should be taken according to package instructions if there are no bowel movements after 48 hours. Unless discharge instructions indicate otherwise, leave your bandage dry and in place until your next appointment in 3-5 days.  You may take a limited sponge bath.  No tube baths or showers until the drains are removed.   You may have steri-strips (small skin tapes) in place directly over the incision.  These strips should be left on the skin for 7-10 days. If you have glue it will come off in next couple week.  Any sutures will be removed at an office visit DRAINS:  If you have drains in place, it is important to keep a list of the amount of drainage produced each day in your drains.  Before leaving the hospital, you should be instructed on drain care.  Call our office if you have any questions about your drains. I will remove your drains when they put out less than 30 cc or ml for 2 consecutive days. ACTIVITIES:  You may resume regular (light) daily activities beginning the next day--such as daily self-care, walking, climbing stairs--gradually increasing activities as tolerated.  You may have sexual intercourse when it is comfortable.  Refrain from any heavy lifting or straining until approved by your doctor. You may drive when you are no longer taking prescription pain medication, you can comfortably wear a seatbelt, and you can safely maneuver your car and apply brakes. RETURN TO WORK:  __________________________________________________________ Dennis Bast should see your doctor in the office for a follow-up appointment approximately 3-5 days after your surgery.  Your doctor's nurse will typically make your follow-up appointment when she calls you with your pathology report.  Expect your pathology report 3-4business days after surgery. OTHER INSTRUCTIONS: ______________________________________________________________________________________________ ____________________________________________________________________________________________ WHEN TO CALL YOUR DR Melenie Minniear: Fever over 101.0 Nausea and/or vomiting Extreme swelling or bruising Continued bleeding from incision. Increased pain, redness,  or drainage from the incision. The clinic staff is available to answer your questions during regular business hours.  Please don't  hesitate to call and ask to speak to one of the nurses for clinical concerns.  If you have a medical emergency, go to the nearest emergency room or call 911.  A surgeon from [facility] is always on call at the hospital. [address] [place], [place], [place]  [address] ? [address], [place], [place]   [address] [phone] ? [phone] ? FAX [phone] Web site: [email or web address]

## 2021-10-20 NOTE — Interval H&P Note (Signed)
History and Physical Interval Note:  10/20/2021 11:20 AM  Victoria Wallace  has presented today for surgery, with the diagnosis of RIGHT BREAST DCIS.  The various methods of treatment have been discussed with the patient and family. After consideration of risks, benefits and other options for treatment, the patient has consented to  Procedure(s): RIGHT TOTAL MASTECTOMY (Right) as a surgical intervention.  The patient's history has been reviewed, patient examined, no change in status, stable for surgery.  I have reviewed the patient's chart and labs.  Questions were answered to the patient's satisfaction.     Rolm Bookbinder

## 2021-10-21 ENCOUNTER — Encounter (HOSPITAL_BASED_OUTPATIENT_CLINIC_OR_DEPARTMENT_OTHER): Payer: Self-pay | Admitting: General Surgery

## 2021-10-21 DIAGNOSIS — D0511 Intraductal carcinoma in situ of right breast: Secondary | ICD-10-CM | POA: Diagnosis not present

## 2021-10-21 MED ORDER — METHOCARBAMOL 500 MG PO TABS: 500.0000 mg | ORAL_TABLET | Freq: Four times a day (QID) | ORAL | 0 refills | Status: DC | PRN

## 2021-10-21 NOTE — Discharge Summary (Signed)
Physician Discharge Summary  Patient ID: Victoria Wallace MRN: [id number] DOB/AGE: January 18, 1951 70 y.o.  Admit date: 10/20/2021 Discharge date: 10/21/2021  Admission Diagnoses: Dcis right breast  Discharge Diagnoses:  Active Problems:   S/P mastectomy, right   Discharged Condition: good  Hospital Course: 82 yof s/p right mastectomy for dcis. She is doing well following am and ready for dc  Consults: None  Significant Diagnostic Studies: none  Treatments: surgery: right mastectomy  Discharge Exam: Blood pressure 137/70, pulse 73, temperature 98.1 F (36.7 C), resp. rate 16, height 5\' 10"  (1.778 m), weight 92.2 kg, SpO2 100 %. Incision/Wound:no hematoma, drain as expected  Disposition: Discharge disposition: 01-Home or Self Care        Allergies as of 10/21/2021       Reactions   Tape    STICKY PART ON HEART MONITOR    Codeine Rash   Penicillins Swelling, Rash   PT WAS ABOUT 70 YEARS OLD  Has patient had a PCN reaction causing immediate rash, facial/tongue/throat swelling, SOB or lightheadedness with hypotension: Yes Has patient had a PCN reaction causing severe rash involving mucus membranes or skin necrosis: Unknown Has patient had a PCN reaction that required hospitalization: No Has patient had a PCN reaction occurring within the last 10 years: No If all of the above answers are "NO", then may proceed with Cephalosporin use.        Medication List     TAKE these medications    cholecalciferol 25 MCG (1000 UNIT) tablet Commonly known as: VITAMIN D3 Take 1,000 Units by mouth daily.   hydrochlorothiazide 25 MG tablet Commonly known as: HYDRODIURIL Take 25 mg by mouth daily.   lisinopril 40 MG tablet Commonly known as: ZESTRIL Take 40 mg by mouth daily.   methocarbamol 500 MG tablet Commonly known as: ROBAXIN Take 1 tablet (500 mg total) by mouth every 6 (six) hours as needed for muscle spasms.   metoprolol succinate 100 MG 24 hr tablet Commonly  known as: TOPROL-XL Take 100 mg by mouth daily.   multivitamin with minerals Tabs tablet Take 1 tablet by mouth daily.   polyethylene glycol 17 g packet Commonly known as: MIRALAX / GLYCOLAX Take 17 g by mouth 2 (two) times daily. What changed: when to take this   Vagifem 10 MCG Tabs vaginal tablet Generic drug: Estradiol Place vaginally once a week.   estradiol 0.1 MG/GM vaginal cream Commonly known as: ESTRACE Place 1 Applicatorful vaginally 2 (two) times a week.        Follow-up Information     Rolm Bookbinder, MD Follow up in 2 week(s).   Specialty: General Surgery Contact information: [address] [address] [phone]                 Signed: Rolm Bookbinder 10/21/2021, 8:06 AM

## 2021-10-25 LAB — SURGICAL PATHOLOGY

## 2021-10-31 ENCOUNTER — Telehealth: Payer: Self-pay | Admitting: Hematology and Oncology

## 2021-10-31 NOTE — Telephone Encounter (Signed)
Scheduled appt per 11/14 referral. Pt is aware of appt date and time.  

## 2021-11-17 ENCOUNTER — Ambulatory Visit: Payer: Medicare PPO | Attending: General Surgery

## 2021-11-17 ENCOUNTER — Other Ambulatory Visit: Payer: Self-pay

## 2021-11-17 DIAGNOSIS — D0511 Intraductal carcinoma in situ of right breast: Secondary | ICD-10-CM | POA: Diagnosis not present

## 2021-11-17 DIAGNOSIS — R600 Localized edema: Secondary | ICD-10-CM | POA: Diagnosis present

## 2021-11-17 DIAGNOSIS — R293 Abnormal posture: Secondary | ICD-10-CM

## 2021-11-17 DIAGNOSIS — M25611 Stiffness of right shoulder, not elsewhere classified: Secondary | ICD-10-CM | POA: Diagnosis present

## 2021-11-17 NOTE — Patient Instructions (Addendum)
     [facility]  [address], [address]  [place] [address]  [phone]  After Breast Cancer Class It is recommended you attend the ABC class to be educated on lymphedema risk reduction. This class is free of charge and lasts for 1 hour. It is a 1-time class. You will need to download the Webex app either on your phone or computer. We will send you a link the night before or the morning of the class. You should be able to click on that link to join the class. This is not a confidential class. You don't have to turn your camera on, but other participants may be able to see your email address. You are scheduled for Monday, December 5th at 11:00.  Scar massage You can begin gentle scar massage to you incision sites after the scabs have fallen off.. Gently place one hand on the incision and move the skin (without sliding on the skin) in various directions. Do this for a few minutes and then you can gently massage either coconut oil or vitamin E cream into the scars.  Compression garment You should continue wearing your compression bra until you feel like you no longer have swelling. Use foam pads as needed for comfort.  Home exercise Program Continue doing the exercises you were given until you feel like you can do them without feeling any tightness at the end.   Walking Program Studies show that 30 minutes of walking per day (fast enough to elevate your heart rate) can significantly reduce the risk of a cancer recurrence. If you can't walk due to other medical reasons, we encourage you to find another activity you could do (like a stationary bike or water exercise).  Posture After breast cancer surgery, people frequently sit with rounded shoulders posture because it puts their incisions on slack and feels better. If you sit like this and scar tissue forms in that position, you can become very tight and have pain sitting or standing with good posture. Try to be aware of  your posture and sit and stand up tall to heal properly.

## 2021-11-17 NOTE — Therapy (Deleted)
[facility] [address] [place], [place], [address] Phone: [phone]   Fax:  [phone]  Physical Therapy Treatment  Patient Details  Name: Victoria Wallace MRN: [id number] Date of Birth: 1951/03/30 No data recorded  Encounter Date: 11/17/2021   PT End of Session - 11/17/21 1414     Visit Number 2    Number of Visits 14    Date for PT Re-Evaluation 12/29/21    PT Start Time 6063    PT Stop Time 0160    PT Time Calculation (min) 62 min    Activity Tolerance Patient tolerated treatment well    Behavior During Therapy [facility] for tasks assessed/performed             Past Medical History:  Diagnosis Date   Abnormal dexamethasone suppression test 06/2014   recieved info from patient   Arthritis    Asymptomatic microscopic hematuria    monitored by PCP    Chronic constipation    Encounter for hepatitis C screening test for low risk patient 05/18/2017   Flu vaccine 08/13/2017   History of colonoscopy 2012   Hypertension    shingles vaccine 2011   recieved info from patient   Tetanus toxoid vaccination administered within the past year 05/18/2017   Uterine prolapse    Wears Glidden    Past Surgical History:  Procedure Laterality Date   BREAST BIOPSY Right    EYE SURGERY     Retinol laser surgery   LUMBAR LAMINECTOMY Bilateral 2002   MOUTH SURGERY     Dental and gum   TOTAL 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   Procedure: RIGHT TOTAL KNEE ARTHROPLASTY;  Surgeon: Paralee Cancel, MD;  Location: [facility];  Service: Orthopedics;  Laterality: Right;  70 mins   TOTAL MASTECTOMY Right 10/20/2021   Procedure: RIGHT TOTAL MASTECTOMY;  Surgeon: Rolm Bookbinder, MD;  Location: [facility];  Service: General;  Laterality: Right;    There were no vitals filed for this visit.   Subjective Assessment - 11/17/21 1302     Subjective here for post surgical follow up. Surgery went well and she had no LN's  removed.  The drain was removed in a week. I think I may have a little seroma. Very sore in chest and in armpit where I think the seroma is. ROM is OK, but I haven't pushed it as much. I am doing most of my activities except being in the pool. now. I can sleep for a little while on my right hugging a pillow. I see the oncologist on Monday. I don't believe I need chemo or radiation. I will see Dr. Donne Hazel tomorrow.    Pertinent History Diagnosed with right DCIS and had  surgery on 10/20/2021 for Right mastectomy.She did not have to have LN's removed.  She will see oncologist on Monday but only to find out if she requires anti-estrogens. Right TKA    Patient Stated Goals Be reassessed after surgery    Currently in Pain? Yes    Pain Score 4     Pain Location Chest    Pain Orientation Right    Pain Descriptors / Indicators Aching    Pain Type Surgical pain    Pain Onset More than a month ago    Pain Frequency Constant    Aggravating Factors  pressure on chest, extremes of reaching    Pain Relieving Factors resting arm    Effect of Pain on Daily Activities  she avoids heavy activities but is able to do most everything else    Multiple Pain Sites No                [facility] PT Assessment - 11/17/21 0001       Assessment   Medical Diagnosis Right breast Cancer    Onset Date/Surgical Date 10/20/21    Prior Therapy yes      Restrictions   Weight Bearing Restrictions No      Balance Screen   Has the patient fallen in the past 6 months No    Has the patient had a decrease in activity level because of a fear of falling?  No    Is the patient reluctant to leave their home because of a fear of falling?  No      Prior Function   Level of Independence Independent    Vocation Retired   Proofreader   Overall Cognitive Status Within Functional Limits for tasks assessed      Observation/Other Assessments   Observations Right mastectomy incision with steri strips present. Scabs at  vertical incision and medial T incision.  Swelling noted medial to right axillary incision, and mildly inferior to incision. Area of induration at mid chest that is tender      Posture/Postural Control   Posture/Postural Control Postural limitations    Postural Limitations Rounded Shoulders;Forward head      AROM   Right Shoulder Extension 70 Degrees    Right Shoulder Flexion 140 Degrees    Right Shoulder ABduction 147 Degrees    Right Shoulder Internal Rotation 74 Degrees    Right Shoulder External Rotation 101 Degrees      Palpation   Palpation comment tender right upper chest area of induration               LYMPHEDEMA/ONCOLOGY QUESTIONNAIRE - 11/17/21 0001       Type   Cancer Type DCIS      Surgeries   Mastectomy Date 10/20/21    Number Lymph Nodes Removed 0      Treatment   Active Chemotherapy Treatment No    Past Chemotherapy Treatment No    Active Radiation Treatment No    Past Radiation Treatment No    Current Hormone Treatment --   not presently but may need   Past Hormone Therapy No                Quick Dash - 11/17/21 0001     Open a tight or new jar Mild difficulty    Do heavy household chores (wash walls, wash floors) Mild difficulty    Carry a shopping bag or briefcase No difficulty    Wash your back No difficulty    Use a knife to cut food No difficulty    Recreational activities in which you take some force or impact through your arm, shoulder, or hand (golf, hammering, tennis) Moderate difficulty    During the past week, to what extent has your arm, shoulder or hand problem interfered with your normal social activities with family, friends, neighbors, or groups? Slightly    During the past week, to what extent has your arm, shoulder or hand problem limited your work or other regular daily activities Slightly    Arm, shoulder, or hand pain. Mild    Tingling (pins and needles) in your arm, shoulder, or hand None    Difficulty Sleeping Mild  difficulty    DASH  Score 18.18 %                             PT Education - 11/17/21 1414     Education Details Pt was educated briefly in MLD techniques for decreasing swelling at right axillary region.    Person(s) Educated Patient    Methods Explanation;Demonstration    Comprehension Returned demonstration;Need further instruction                 PT Long Term Goals - 11/17/21 1426       PT LONG TERM GOAL #1   Title Pt will restore right shoulder AROM to pre-surgical levels    Time 6    Period Weeks    Status On-going    Target Date 12/29/21      PT LONG TERM GOAL #2   Title Right shoulder abd will be atleast 160 degrees    Time 3    Period Weeks    Status New    Target Date 12/08/21      PT LONG TERM GOAL #3   Title Pt will have decreased complaints of swelling by 50%    Time 6    Period Weeks    Status New    Target Date 12/29/21      PT LONG TERM GOAL #4   Title Pt will be independent in self MLD to decrease swelling    Time 6    Period Weeks    Status New    Target Date 12/29/21      PT LONG TERM GOAL #5   Title Pt will be educated in a ROM and strengthening HEP and will be independent    Time 6    Period Weeks    Status New    Target Date 12/29/21                   Plan - 11/17/21 1415     Clinical Impression Statement Pt is s/p Right mastectomy on 10/20/2021 for DCIS. She had no LN's removed. She is making excellent progress with ROM and is only lacking 10 degrees of shoulder flexion and 29 degrees of abduction.  She does have some swelling medial to the right axilla that is uncomfortable, and mild swelling distal to the incision.  There is a small area of induration in the right upper mid chest that is tender. 2 foam pads covered with TG soft were made to place under lower border of sports or compression bra to prevent upward sliding or rolling at the band.  Another pad was made to cover the area of swelling and  induration and she will wear it if comfortable for her.  She was instructed in a quick version of MLD to the right axilla and inguinal region at her request and will need review.  I showed her the lymphatic chart so she would understand why it is done with light pressure. She will benefit from skilled PT to address deficits and return to PLOF    Personal Factors and Comorbidities Comorbidity 1    Comorbidities Right breast CAncer, Right TKA    Stability/Clinical Decision Making Stable/Uncomplicated    Clinical Decision Making Low    Rehab Potential Excellent    PT Frequency 2x / week    PT Duration 6 weeks    PT Treatment/Interventions ADLs/Self Care Home Management;Therapeutic exercise;Manual techniques;Patient/family education;Manual lymph drainage;Scar mobilization;Passive range of motion  PT Next Visit Plan STM prn for pain and tenderness, PROM right shoulder, AAROM (dowel) , did foam help at all?MLD to axillary/chest swelling and instruct pt.    PT Home Exercise Plan 4 post op exs    Recommended Other Services ABC on Dec 5    Consulted and Agree with Plan of Care Patient             Patient will benefit from skilled therapeutic intervention in order to improve the following deficits and impairments:  Decreased knowledge of precautions, Postural dysfunction, Decreased scar mobility, Decreased skin integrity, Pain, Increased edema, Decreased strength  Visit Diagnosis: Ductal carcinoma in situ of right breast  Localized edema  Abnormal posture  Stiffness of right shoulder, not elsewhere classified     Problem List Patient Active Problem List   Diagnosis Date Noted   S/P mastectomy, right 10/20/2021   Posterior vitreous detachment of left eye 11/30/2020   Vitreous floaters, left 11/30/2020   Round hole of right eye 11/30/2020   Vitreous floaters, right 11/30/2020   Nuclear sclerotic cataract of right eye 11/30/2020   Nuclear sclerotic cataract of left eye 11/30/2020    Round hole of retina without detachment 11/30/2020   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

## 2021-11-17 NOTE — Therapy (Signed)
[facility] @ [address] [place], [place], [address] Phone: [phone]   Fax:  [phone]  Physical Therapy Treatment  Patient Details  Name: Victoria Wallace MRN: [id number] Date of Birth: April 12, 1951 Referring Provider (PT): Dr. Donne Hazel   Encounter Date: 11/17/2021   PT End of Session - 11/17/21 1414     Visit Number 2    Number of Visits 14    Date for PT Re-Evaluation 12/29/21    PT Start Time 8938    PT Stop Time 1017    PT Time Calculation (min) 62 min    Activity Tolerance Patient tolerated treatment well    Behavior During Therapy [facility] for tasks assessed/performed             Past Medical History:  Diagnosis Date   Abnormal dexamethasone suppression test 06/2014   recieved info from patient   Arthritis    Asymptomatic microscopic hematuria    monitored by PCP    Chronic constipation    Encounter for hepatitis C screening test for low risk patient 05/18/2017   Flu vaccine 08/13/2017   History of colonoscopy 2012   Hypertension    shingles vaccine 2011   recieved info from patient   Tetanus toxoid vaccination administered within the past year 05/18/2017   Uterine prolapse    Wears [place]    Past Surgical History:  Procedure Laterality Date   BREAST BIOPSY Right    EYE SURGERY     Retinol laser surgery   LUMBAR LAMINECTOMY Bilateral 2002   MOUTH SURGERY     Dental and gum   TOTAL 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   Procedure: RIGHT TOTAL KNEE ARTHROPLASTY;  Surgeon: Paralee Cancel, MD;  Location: [facility];  Service: Orthopedics;  Laterality: Right;  70 mins   TOTAL MASTECTOMY Right 10/20/2021   Procedure: RIGHT TOTAL MASTECTOMY;  Surgeon: Rolm Bookbinder, MD;  Location: [facility];  Service: General;  Laterality: Right;    There were no vitals filed for this visit.   Subjective Assessment - 11/17/21 1302     Subjective here for post surgical follow up. Surgery went well  and she had no LN's removed.  The drain was removed in a week. I think I may have a little seroma. Very sore in chest and in armpit where I think the seroma is. ROM is OK, but I haven't pushed it as much. I am doing most of my activities except being in the pool. now. I can sleep for a little while on my right hugging a pillow. I see the oncologist on Monday. I don't believe I need chemo or radiation. I will see Dr. Donne Hazel tomorrow.    Pertinent History Diagnosed with right DCIS and had  surgery on 10/20/2021 for Right mastectomy.She did not have to have LN's removed.  She will see oncologist on Monday but only to find out if she requires anti-estrogens. Right TKA    Patient Stated Goals Be reassessed after surgery    Currently in Pain? Yes    Pain Score 4     Pain Location Chest    Pain Orientation Right    Pain Descriptors / Indicators Aching    Pain Type Surgical pain    Pain Onset More than a month ago    Pain Frequency Constant    Aggravating Factors  pressure on chest, extremes of reaching    Pain Relieving Factors resting arm    Effect of Pain  on Daily Activities she avoids heavy activities but is able to do most everything else    Multiple Pain Sites No                [facility] PT Assessment - 11/17/21 0001       Assessment   Medical Diagnosis Right breast Cancer    Referring Provider (PT) Dr. Donne Hazel    Onset Date/Surgical Date 10/20/21    Prior Therapy yes      Restrictions   Weight Bearing Restrictions No      Balance Screen   Has the patient fallen in the past 6 months No    Has the patient had a decrease in activity level because of a fear of falling?  No    Is the patient reluctant to leave their home because of a fear of falling?  No      Prior Function   Level of Independence Independent    Vocation Retired   Proofreader   Overall Cognitive Status Within Functional Limits for tasks assessed      Observation/Other Assessments   Observations  Right mastectomy incision with steri strips present. Scabs at vertical incision and medial T incision.  Swelling noted medial to right axillary incision, and mildly inferior to incision. Area of induration at mid chest that is tender      Posture/Postural Control   Posture/Postural Control Postural limitations    Postural Limitations Rounded Shoulders;Forward head      AROM   Right Shoulder Extension 70 Degrees    Right Shoulder Flexion 140 Degrees    Right Shoulder ABduction 147 Degrees    Right Shoulder Internal Rotation 74 Degrees    Right Shoulder External Rotation 101 Degrees      Palpation   Palpation comment tender right upper chest area of induration               LYMPHEDEMA/ONCOLOGY QUESTIONNAIRE - 11/17/21 0001       Type   Cancer Type DCIS      Surgeries   Mastectomy Date 10/20/21    Number Lymph Nodes Removed 0      Treatment   Active Chemotherapy Treatment No    Past Chemotherapy Treatment No    Active Radiation Treatment No    Past Radiation Treatment No    Current Hormone Treatment --   not presently but may need   Past Hormone Therapy No                Quick Dash - 11/17/21 0001     Open a tight or new jar Mild difficulty    Do heavy household chores (wash walls, wash floors) Mild difficulty    Carry a shopping bag or briefcase No difficulty    Wash your back No difficulty    Use a knife to cut food No difficulty    Recreational activities in which you take some force or impact through your arm, shoulder, or hand (golf, hammering, tennis) Moderate difficulty    During the past week, to what extent has your arm, shoulder or hand problem interfered with your normal social activities with family, friends, neighbors, or groups? Slightly    During the past week, to what extent has your arm, shoulder or hand problem limited your work or other regular daily activities Slightly    Arm, shoulder, or hand pain. Mild    Tingling (pins and needles) in  your arm, shoulder, or hand None  Difficulty Sleeping Mild difficulty    DASH Score 18.18 %                             PT Education - 11/17/21 1414     Education Details Pt was educated briefly in MLD techniques for decreasing swelling at right axillary region.    Person(s) Educated Patient    Methods Explanation;Demonstration    Comprehension Returned demonstration;Need further instruction                 PT Long Term Goals - 11/17/21 1426       PT LONG TERM GOAL #1   Title Pt will restore right shoulder AROM to pre-surgical levels    Time 6    Period Weeks    Status On-going    Target Date 12/29/21      PT LONG TERM GOAL #2   Title Right shoulder abd will be atleast 160 degrees    Time 3    Period Weeks    Status New    Target Date 12/08/21      PT LONG TERM GOAL #3   Title Pt will have decreased complaints of swelling by 50%    Time 6    Period Weeks    Status New    Target Date 12/29/21      PT LONG TERM GOAL #4   Title Pt will be independent in self MLD to decrease swelling    Time 6    Period Weeks    Status New    Target Date 12/29/21      PT LONG TERM GOAL #5   Title Pt will be educated in a ROM and strengthening HEP and will be independent    Time 6    Period Weeks    Status New    Target Date 12/29/21                   Plan - 11/17/21 1415     Clinical Impression Statement Pt is s/p Right mastectomy on 10/20/2021 for DCIS. She had no LN's removed. She is making excellent progress with ROM and is only lacking 10 degrees of shoulder flexion and 29 degrees of abduction.  She does have some swelling medial to the right axilla that is uncomfortable, and mild swelling distal to the incision.  There is a small area of induration in the right upper mid chest that is tender. 2 foam pads covered with TG soft were made to place under lower border of sports or compression bra to prevent upward sliding or rolling at the band.   Another pad was made to cover the area of swelling and induration and she will wear it if comfortable for her.  She was instructed in a quick version of MLD to the right axilla and inguinal region at her request and will need review.  I showed her the lymphatic chart so she would understand why it is done with light pressure. She will benefit from skilled PT to address deficits and return to PLOF    Personal Factors and Comorbidities Comorbidity 1    Comorbidities Right breast CAncer, Right TKA    Stability/Clinical Decision Making Stable/Uncomplicated    Clinical Decision Making Low    Rehab Potential Excellent    PT Frequency 2x / week    PT Duration 6 weeks    PT Treatment/Interventions ADLs/Self Care Home Management;Therapeutic exercise;Manual techniques;Patient/family education;Manual  lymph drainage;Scar mobilization;Passive range of motion    PT Next Visit Plan STM prn for pain and tenderness, PROM right shoulder, AAROM (dowel) , did foam help at all?MLD to axillary/chest swelling and instruct pt.    PT Home Exercise Plan 4 post op exs    Recommended Other Services [facility] on Dec 5    Consulted and Agree with Plan of Care Patient             Patient will benefit from skilled therapeutic intervention in order to improve the following deficits and impairments:  Decreased knowledge of precautions, Postural dysfunction, Decreased scar mobility, Decreased skin integrity, Pain, Increased edema, Decreased strength  Visit Diagnosis: Ductal carcinoma in situ of right breast  Localized edema  Abnormal posture  Stiffness of right shoulder, not elsewhere classified     Problem List Patient Active Problem List   Diagnosis Date Noted   S/P mastectomy, right 10/20/2021   Posterior vitreous detachment of left eye 11/30/2020   Vitreous floaters, left 11/30/2020   Round hole of right eye 11/30/2020   Vitreous floaters, right 11/30/2020   Nuclear sclerotic cataract of right eye 11/30/2020    Nuclear sclerotic cataract of left eye 11/30/2020   Round hole of retina without detachment 11/30/2020   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

## 2021-11-20 NOTE — Progress Notes (Signed)
[facility] CONSULT NOTE  Patient Care Team: Michael Boston, MD as PCP - General (Internal Medicine) Rockwell Germany, RN as Oncology Nurse Navigator Mauro Kaufmann, RN as Oncology Nurse Navigator  CHIEF COMPLAINTS/PURPOSE OF CONSULTATION:  Newly diagnosed right breast DCIS  HISTORY OF PRESENTING ILLNESS:  Victoria Wallace 70 y.o. female is here because of recent diagnosis of DCIS of the right breast. Screening mammogram on 08/25/2021 showed calcification in the right breast. Diagnostic mammogram and [place] on 09/05/2021 showed a highly suspicious a group of calcifications in the upper outer middle depth of the right breast spanning 2.9 cm, and a 3 cm group of amorphous calcifications in the upper-outer posterior right breast. Right mastectomy on 10/20/2021 showed high grade DCIS with margins uninvolved by carcinoma. She presents to the clinic today for initial evaluation and discussion of treatment options.  Complaining of pain and discomfort in the surgical bed.  She has had swelling of the costochondral junction around the second rib on the right side.  I reviewed her records extensively and collaborated the history with the patient.  SUMMARY OF ONCOLOGIC HISTORY: Oncology History  Ductal carcinoma in situ (DCIS) of right breast  09/15/2019 Initial Diagnosis   Right breast biopsy upper outer quadrant: DCIS with necrosis high-grade, upper outer posterior: DCIS involving a complex sclerosing lesion, ER 100%, PR 60 to 80%   10/20/2021 Surgery   Right mastectomy: High-grade DCIS 2.2 cm, margins negative, ER 100%, PR 60-80%     MEDICAL HISTORY:  Past Medical History:  Diagnosis Date   Abnormal dexamethasone suppression test 06/2014   recieved info from patient   Arthritis    Asymptomatic microscopic hematuria    monitored by PCP    Chronic constipation    Encounter for hepatitis C screening test for low risk patient 05/18/2017   Flu vaccine 08/13/2017   History of  colonoscopy 2012   Hypertension    shingles vaccine 2011   recieved info from patient   Tetanus toxoid vaccination administered within the past year 05/18/2017   Uterine prolapse    Wears [place]    SURGICAL HISTORY: Past Surgical History:  Procedure Laterality Date   BREAST BIOPSY Right    EYE SURGERY     Retinol laser surgery   LUMBAR LAMINECTOMY Bilateral 2002   MOUTH SURGERY     Dental and gum   TOTAL 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   Procedure: RIGHT TOTAL KNEE ARTHROPLASTY;  Surgeon: Paralee Cancel, MD;  Location: [facility];  Service: Orthopedics;  Laterality: Right;  70 mins   TOTAL MASTECTOMY Right 10/20/2021   Procedure: RIGHT TOTAL MASTECTOMY;  Surgeon: Rolm Bookbinder, MD;  Location: [facility];  Service: General;  Laterality: Right;    SOCIAL HISTORY: Social History   Socioeconomic History   Marital status: Widowed    Spouse name: Not on file   Number of children: Not on file   Years of education: Not on file   Highest education level: Not on file  Occupational History   Not on file  Tobacco Use   Smoking status: Never   Smokeless tobacco: Never  Vaping Use   Vaping Use: Never used  Substance and Sexual Activity   Alcohol use: Yes    Alcohol/week: 1.0 standard drink    Types: 1 Glasses of wine per week    Comment: rare   Drug use: No   Sexual activity: Yes  Other Topics Concern   Not on file  Social History  Narrative   Not on file   Social Determinants of Health   Financial Resource Strain: Not on file  Food Insecurity: Not on file  Transportation Needs: Not on file  Physical Activity: Not on file  Stress: Not on file  Social Connections: Not on file  Intimate Partner Violence: Not on file    FAMILY HISTORY: Family History  Problem Relation Age of Onset   Breast cancer Maternal Aunt     ALLERGIES:  is allergic to tape, codeine, and penicillins.  MEDICATIONS:  Current Outpatient Medications  Medication Sig Dispense  Refill   cholecalciferol (VITAMIN D3) 25 MCG (1000 UNIT) tablet Take 1,000 Units by mouth daily.     estradiol (ESTRACE) 0.1 MG/GM vaginal cream Place 1 Applicatorful vaginally 2 (two) times a week.     Estradiol (VAGIFEM) 10 MCG TABS vaginal tablet Place vaginally once a week.     hydrochlorothiazide (HYDRODIURIL) 25 MG tablet Take 25 mg by mouth daily.     lisinopril (PRINIVIL,ZESTRIL) 40 MG tablet Take 40 mg by mouth daily.     methocarbamol (ROBAXIN) 500 MG tablet Take 1 tablet (500 mg total) by mouth every 6 (six) hours as needed for muscle spasms. 30 tablet 0   metoprolol succinate (TOPROL-XL) 100 MG 24 hr tablet Take 100 mg by mouth daily.  0   Multiple Vitamin (MULTIVITAMIN WITH MINERALS) TABS tablet Take 1 tablet by mouth daily.     polyethylene glycol (MIRALAX / GLYCOLAX) packet Take 17 g by mouth 2 (two) times daily. (Patient taking differently: Take 17 g by mouth once.) 14 each 0   No current facility-administered medications for this visit.    REVIEW OF SYSTEMS:   Constitutional: Denies fevers, chills or abnormal night sweats   Breast: Patient is concerned about the extra tissue that was left and underneath the right axilla as well as the surgical tissue being still tender.  She is also found a significant area of costochondritis in the right second intercostal space. All other systems were reviewed with the patient and are negative.  PHYSICAL EXAMINATION: ECOG PERFORMANCE STATUS: 1 - Symptomatic but completely ambulatory  Vitals:   11/21/21 1254  BP: (!) 156/65  Pulse: 72  Resp: 18  Temp: 97.8 F (36.6 C)  SpO2: 100%   Filed Weights   11/21/21 1254  Weight: 204 lb 9.6 oz (92.8 kg)    GENERAL:alert, no distress and comfortable SKIN: skin color, texture, turgor are normal, no rashes or significant lesions EYES: normal, conjunctiva are pink and non-injected, sclera clear OROPHARYNX:no exudate, no erythema and lips, buccal mucosa, and tongue normal  NECK: supple,  thyroid normal size, non-tender, without nodularity LYMPH:  no palpable lymphadenopathy in the cervical, axillary or inguinal LUNGS: clear to auscultation and percussion with normal breathing effort HEART: regular rate & rhythm and no murmurs and no lower extremity edema ABDOMEN:abdomen soft, non-tender and normal bowel sounds Musculoskeletal:no cyanosis of digits and no clubbing  PSYCH: alert & oriented x 3 with fluent speech NEURO: no focal motor/sensory deficits BREAST: Right mastectomy scar is noted.  There is significant prominence of the second costochondral junction.  This is related to costochondritis. No palpable axillary or supraclavicular lymphadenopathy (exam performed in the presence of a chaperone)   LABORATORY DATA:  I have reviewed the data as listed Lab Results  Component Value Date   WBC 9.0 08/29/2017   HGB 9.2 (L) 08/29/2017   HCT 26.8 (L) 08/29/2017   MCV 95.7 08/29/2017   PLT 185 08/29/2017  Lab Results  Component Value Date   NA 136 10/12/2021   K 4.0 10/12/2021   CL 99 10/12/2021   CO2 28 10/12/2021    RADIOGRAPHIC STUDIES: I have personally reviewed the radiological reports and agreed with the findings in the report.  ASSESSMENT AND PLAN:  Ductal carcinoma in situ (DCIS) of right breast 10/20/2021 right mastectomy: High-grade DCIS 2.2 cm, margins negative, ER 100%, PR 60-80%  Pathology review: I discussed with the patient the difference between DCIS and invasive breast cancer. It is considered a precancerous lesion. DCIS is classified as a 0. It is generally detected through mammograms as calcifications. We discussed the significance of grades and its impact on prognosis. We also discussed the importance of ER and PR receptors and their implications to adjuvant treatment options. Prognosis of DCIS dependence on grade, comedo necrosis. It is anticipated that if not treated, 20-30% of DCIS can develop into invasive breast  cancer.  Recommendation:antiestrogen therapy with tamoxifen 5 years (she will start at 10 mg and go up to 20 mg if she tolerates it well)  Tamoxifen counseling: We discussed the risks and benefits of tamoxifen. These include but not limited to insomnia, hot flashes, mood changes, vaginal dryness, and weight gain. Although rare, serious side effects including endometrial cancer, risk of blood clots were also discussed. We strongly believe that the benefits far outweigh the risks. Patient understands these risks and consented to starting treatment. Planned treatment duration is 5 years.  Costochondritis: It should improve over time but if it does not get better then we may have to consider getting a scan. Return to clinic in 3 months for survivorship care plan was   All questions were answered. The patient knows to call the clinic with any problems, questions or concerns.   Rulon Eisenmenger, MD, MPH 11/21/2021    I, Thana Ates, am acting as scribe for Nicholas Lose, MD.  I have reviewed the above documentation for accuracy and completeness, and I agree with the above.

## 2021-11-21 ENCOUNTER — Inpatient Hospital Stay: Payer: Medicare PPO | Attending: Hematology and Oncology | Admitting: Hematology and Oncology

## 2021-11-21 ENCOUNTER — Other Ambulatory Visit: Payer: Self-pay

## 2021-11-21 ENCOUNTER — Encounter: Payer: Self-pay | Admitting: *Deleted

## 2021-11-21 DIAGNOSIS — M940 Chondrocostal junction syndrome [Tietze]: Secondary | ICD-10-CM | POA: Diagnosis not present

## 2021-11-21 DIAGNOSIS — Z803 Family history of malignant neoplasm of breast: Secondary | ICD-10-CM | POA: Insufficient documentation

## 2021-11-21 DIAGNOSIS — Z9011 Acquired absence of right breast and nipple: Secondary | ICD-10-CM | POA: Insufficient documentation

## 2021-11-21 DIAGNOSIS — D0511 Intraductal carcinoma in situ of right breast: Secondary | ICD-10-CM | POA: Diagnosis not present

## 2021-11-21 DIAGNOSIS — Z885 Allergy status to narcotic agent status: Secondary | ICD-10-CM | POA: Diagnosis not present

## 2021-11-21 DIAGNOSIS — Z880 Allergy status to penicillin: Secondary | ICD-10-CM | POA: Insufficient documentation

## 2021-11-21 DIAGNOSIS — Z888 Allergy status to other drugs, medicaments and biological substances status: Secondary | ICD-10-CM | POA: Diagnosis not present

## 2021-11-21 MED ORDER — TAMOXIFEN CITRATE 20 MG PO TABS: 20.0000 mg | ORAL_TABLET | Freq: Every day | ORAL | 3 refills | Status: DC

## 2021-11-21 NOTE — Assessment & Plan Note (Signed)
10/20/2021 right mastectomy: High-grade DCIS 2.2 cm, margins negative, ER 100%, PR 60-80%  Pathology review: I discussed with the patient the difference between DCIS and invasive breast cancer. It is considered a precancerous lesion. DCIS is classified as a 0. It is generally detected through mammograms as calcifications. We discussed the significance of grades and its impact on prognosis. We also discussed the importance of ER and PR receptors and their implications to adjuvant treatment options. Prognosis of DCIS dependence on grade, comedo necrosis. It is anticipated that if not treated, 20-30% of DCIS can develop into invasive breast cancer.  Recommendation:antiestrogen therapy with tamoxifen 5 years  Tamoxifen counseling: We discussed the risks and benefits of tamoxifen. These include but not limited to insomnia, hot flashes, mood changes, vaginal dryness, and weight gain. Although rare, serious side effects including endometrial cancer, risk of blood clots were also discussed. We strongly believe that the benefits far outweigh the risks. Patient understands these risks and consented to starting treatment. Planned treatment duration is 5 years.  Return to clinic in 3 months for survivorship care plan was

## 2021-11-23 ENCOUNTER — Encounter: Payer: Self-pay | Admitting: Physical Therapy

## 2021-11-23 ENCOUNTER — Ambulatory Visit: Payer: Medicare PPO | Admitting: Physical Therapy

## 2021-11-23 ENCOUNTER — Other Ambulatory Visit: Payer: Self-pay

## 2021-11-23 DIAGNOSIS — R600 Localized edema: Secondary | ICD-10-CM

## 2021-11-23 DIAGNOSIS — D0511 Intraductal carcinoma in situ of right breast: Secondary | ICD-10-CM | POA: Diagnosis not present

## 2021-11-23 DIAGNOSIS — M25611 Stiffness of right shoulder, not elsewhere classified: Secondary | ICD-10-CM

## 2021-11-23 DIAGNOSIS — R293 Abnormal posture: Secondary | ICD-10-CM

## 2021-11-23 NOTE — Therapy (Signed)
[place] @ [place] [place], [place], [address] Phone: [phone]   Fax:  [phone]  Physical Therapy Treatment  Patient Details  Name: Victoria Wallace MRN: [id number] Date of Birth: 1951-05-12 Referring Provider (PT): Dr. Donne Hazel   Encounter Date: 11/23/2021   PT End of Session - 11/23/21 1702     Visit Number 3    Number of Visits 14    Date for PT Re-Evaluation 12/29/21    PT Start Time 1602    PT Stop Time 7564    PT Time Calculation (min) 53 min    Activity Tolerance Patient tolerated treatment well    Behavior During Therapy [facility] for tasks assessed/performed             Past Medical History:  Diagnosis Date   Abnormal dexamethasone suppression test 06/2014   recieved info from patient   Arthritis    Asymptomatic microscopic hematuria    monitored by PCP    Chronic constipation    Encounter for hepatitis C screening test for low risk patient 05/18/2017   Flu vaccine 08/13/2017   History of colonoscopy 2012   Hypertension    shingles vaccine 2011   recieved info from patient   Tetanus toxoid vaccination administered within the past year 05/18/2017   Uterine prolapse    Wears [place]    Past Surgical History:  Procedure Laterality Date   BREAST BIOPSY Right    EYE SURGERY     Retinol laser surgery   LUMBAR LAMINECTOMY Bilateral 2002   MOUTH SURGERY     Dental and gum   TOTAL 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   Procedure: RIGHT TOTAL KNEE ARTHROPLASTY;  Surgeon: Paralee Cancel, MD;  [facility]: [facility];  Service: Orthopedics;  Laterality: Right;  70 mins   TOTAL MASTECTOMY Right 10/20/2021   Procedure: RIGHT TOTAL MASTECTOMY;  Surgeon: Rolm Bookbinder, MD;  Location: [facility];  Service: General;  Laterality: Right;    There were no vitals filed for this visit.   Subjective Assessment - 11/23/21 1603     Subjective The surgeon and oncologist both agree that I have  costochondritis. Under the axilla is not swollen but the doctor reports that is where a stitch or two has come loose and that is tissue.    Pertinent History Diagnosed with right DCIS and had  surgery on 10/20/2021 for Right mastectomy.She did not have to have LN's removed.  She will see oncologist on Monday but only to find out if she requires anti-estrogens. Right TKA    Patient Stated Goals Be reassessed after surgery    Currently in Pain? Yes    Pain Score 5     Pain Location Chest    Pain Orientation Right    Pain Descriptors / Indicators Aching    Pain Type Surgical pain    Pain Onset More than a month ago    Pain Frequency Constant    Aggravating Factors  using the arm    Pain Relieving Factors gently rubbing it, crossing arms across chest with gentle pressure on the area    Effect of Pain on Daily Activities no effect                               [facility] Adult PT Treatment/Exercise - 11/23/21 0001       Shoulder Exercises: Supine   Flexion AAROM;Both;10 reps  with dowel, pt returned therapist demo   ABduction AAROM;Both;10 reps   with dowel, pt returned therapist demo     Shoulder Exercises: Pulleys   Flexion 2 minutes    ABduction 2 minutes      Manual Therapy   Manual Therapy Manual Lymphatic Drainage (MLD);Passive ROM    Manual Lymphatic Drainage (MLD) in supine: short neck, R axillary nodes, R lateral trunk and anterior chest moving towards R axillary nodes    Passive ROM to R shoulder in to flexion, ER, and abduction                          PT Long Term Goals - 11/17/21 1426       PT LONG TERM GOAL #1   Title Pt will restore right shoulder AROM to pre-surgical levels    Time 6    Period Weeks    Status On-going    Target Date 12/29/21      PT LONG TERM GOAL #2   Title Right shoulder abd will be atleast 160 degrees    Time 3    Period Weeks    Status New    Target Date 12/08/21      PT LONG TERM GOAL #3   Title Pt  will have decreased complaints of swelling by 50%    Time 6    Period Weeks    Status New    Target Date 12/29/21      PT LONG TERM GOAL #4   Title Pt will be independent in self MLD to decrease swelling    Time 6    Period Weeks    Status New    Target Date 12/29/21      PT LONG TERM GOAL #5   Title Pt will be educated in a ROM and strengthening HEP and will be independent    Time 6    Period Weeks    Status New    Target Date 12/29/21                   Plan - 11/23/21 1703     Clinical Impression Statement Pt reports most of the fullness in her axilla is tissue according to her doctor. She does still seem to have some edema in posterior/lateral trunk and anterior chest so performed MLD to this area today. Issued supine dowel exercises to pt in flexion and abduction and had her return demonstrate 10 reps of each. Continued with PROM to R shoulder in direction of flexion, abduction and ER with pt able to achieve full ROM with stretch at end range.    PT Frequency 2x / week    PT Duration 6 weeks    PT Treatment/Interventions ADLs/Self Care Home Management;Therapeutic exercise;Manual techniques;Patient/family education;Manual lymph drainage;Scar mobilization;Passive range of motion    PT Next Visit Plan pulleys, ball, issue handout for supine dowel flex and abd (given at last session but forgot to give printed HEP), PROM R shoulder, MLD to axillary/chest swelling and instruct pt.    Consulted and Agree with Plan of Care Patient             Patient will benefit from skilled therapeutic intervention in order to improve the following deficits and impairments:  Decreased knowledge of precautions, Postural dysfunction, Decreased scar mobility, Decreased skin integrity, Pain, Increased edema, Decreased strength  Visit Diagnosis: Stiffness of right shoulder, not elsewhere classified  Localized edema  Abnormal posture  Ductal  carcinoma in situ of right  breast     Problem List Patient Active Problem List   Diagnosis Date Noted   Ductal carcinoma in situ (DCIS) of right breast 11/21/2021   S/P mastectomy, right 10/20/2021   Posterior vitreous detachment of left eye 11/30/2020   Vitreous floaters, left 11/30/2020   Round hole of right eye 11/30/2020   Vitreous floaters, right 11/30/2020   Nuclear sclerotic cataract of right eye 11/30/2020   Nuclear sclerotic cataract of left eye 11/30/2020   Round hole of retina without detachment 11/30/2020   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   S/P right TKA 08/28/2017   S/P total knee replacement 08/28/2017    Manus Gunning, PT 11/23/2021, 5:06 PM  [place] @ [address] [place], [place], [address] Phone: [phone]   Fax:  [phone]  Name: KYESHA BALLA MRN: [id number] Date of Birth: 30-Oct-1951   Manus Gunning, PT 11/23/21 5:06 PM

## 2021-11-28 ENCOUNTER — Other Ambulatory Visit: Payer: Self-pay | Admitting: Internal Medicine

## 2021-11-28 DIAGNOSIS — M8580 Other specified disorders of bone density and structure, unspecified site: Secondary | ICD-10-CM

## 2021-12-01 ENCOUNTER — Encounter (INDEPENDENT_AMBULATORY_CARE_PROVIDER_SITE_OTHER): Payer: Medicare PPO | Admitting: Ophthalmology

## 2021-12-01 ENCOUNTER — Ambulatory Visit: Payer: Medicare PPO | Admitting: Physical Therapy

## 2021-12-01 ENCOUNTER — Other Ambulatory Visit: Payer: Self-pay

## 2021-12-01 DIAGNOSIS — R600 Localized edema: Secondary | ICD-10-CM

## 2021-12-01 DIAGNOSIS — D0511 Intraductal carcinoma in situ of right breast: Secondary | ICD-10-CM

## 2021-12-01 DIAGNOSIS — M25611 Stiffness of right shoulder, not elsewhere classified: Secondary | ICD-10-CM

## 2021-12-01 DIAGNOSIS — R293 Abnormal posture: Secondary | ICD-10-CM

## 2021-12-01 NOTE — Patient Instructions (Signed)
SHOULDER: Flexion - Supine (Cane)        Cancer Rehab [phone]    Hold cane in both hands. Raise arms up overhead. Do not allow back to arch. Hold _5__ seconds. Do __5-10__ times; __1-2__ times a day.   SELF ASSISTED WITH OBJECT: Shoulder Abduction / Adduction - Supine    Hold cane with both hands. Move both arms from side to side, keep elbows straight.  Hold when stretch felt for __5__ seconds. Repeat __5-10__ times; __1-2__ times a day. Once this becomes easier progress to third picture bringing affected arm towards ear by staying out to side. Same hold for _5_seconds. Repeat  _5-10_ times, _1-2_ times/day.  Shoulder Blade Stretch    Clasp fingers behind head with elbows touching in front of face. Pull elbows back while pressing shoulder blades together. Relax and hold as tolerated, can place pillow under elbow here for comfort as needed and to allow for prolonged stretch.  Repeat __5__ times. Do __1-2__ sessions per day.     SHOULDER: External Rotation - Supine (Cane)    Hold cane with both hands. Rotate arm away from body. Keep elbow on floor and next to body. _5-10__ reps per set, hold 5 seconds, _1-2__ sets per day. Add towel to keep elbow at side.  Copyright  VHI. All rights reserved.     Flexion (Eccentric) - Active-Assist (Cane)          Cancer Rehab [phone]    Use unaffected arm to push affected arm forward. Avoid hiking shoulder (shoulder should NOT touch cheek). Keep palm relaxed. Slowly lower affected arm. Hold stretch for _5_ seconds repeating _5-10_ times, _1-2_ times a day.  Abduction (Eccentric) - Active-Assist (Cane)    Use unaffected arm to push affected arm out to side. Avoid hiking shoulder (shoulder should NOT touch cheek). Keep palm relaxed. Slowly lower affected arm. Hold stretch _5_ seconds repeating _5-10_ times, _1-2_ times a day.  Cane Exercise: Extension   Stand holding cane behind back with both hands palm-up. Lift the cane away from  body until gentle stretch felt. Do NOT lean forward.  Hold __5__ seconds. Repeat _5-10___ times. Do _1-2___ sessions per day.  CHEST: Doorway, Bilateral - Standing    Standing in doorway, place hands on wall with elbows bent at shoulder height and place one foot in front of other. Shift weight onto front foot. Hold _10-20__ seconds. Do _3-5_ times, _1-2_ times a day.

## 2021-12-01 NOTE — Therapy (Signed)
[place] @ [address] [place], [place], [address] Phone: [phone]   Fax:  [phone]  Physical Therapy Treatment  Patient Details  Name: Victoria Wallace MRN: [id number] Date of Birth: 17-Nov-1951 Referring Provider (PT): Dr. Donne Hazel   Encounter Date: 12/01/2021   PT End of Session - 12/01/21 1358     Visit Number 4    Number of Visits 14    Date for PT Re-Evaluation 12/29/21    PT Start Time 1100    PT Stop Time 9518    PT Time Calculation (min) 55 min    Activity Tolerance Patient tolerated treatment well    Behavior During Therapy [facility] for tasks assessed/performed             Past Medical History:  Diagnosis Date   Abnormal dexamethasone suppression test 06/2014   recieved info from patient   Arthritis    Asymptomatic microscopic hematuria    monitored by PCP    Chronic constipation    Encounter for hepatitis C screening test for low risk patient 05/18/2017   Flu vaccine 08/13/2017   History of colonoscopy 2012   Hypertension    shingles vaccine 2011   recieved info from patient   Tetanus toxoid vaccination administered within the past year 05/18/2017   Uterine prolapse    Wears [place]    Past Surgical History:  Procedure Laterality Date   BREAST BIOPSY Right    EYE SURGERY     Retinol laser surgery   LUMBAR LAMINECTOMY Bilateral 2002   MOUTH SURGERY     Dental and gum   TOTAL 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   Procedure: RIGHT TOTAL KNEE ARTHROPLASTY;  Surgeon: Paralee Cancel, MD;  [facility];  Service: Orthopedics;  Laterality: Right;  70 mins   TOTAL MASTECTOMY Right 10/20/2021   Procedure: RIGHT TOTAL MASTECTOMY;  Surgeon: Rolm Bookbinder, MD;  Location: [facility];  Service: General;  Laterality: Right;    There were no vitals filed for this visit.   Subjective Assessment - 12/01/21 1106     Subjective Pt says she has certain points of tenderness in her  right chest. ( she was told by the doctor that she has costochondritis) She feels that she has made a lot of progress in the past week. She plans to go back to pool for water exercise 5 times a week exercise in January when the pool opens    Pertinent History Diagnosed with right DCIS and had  surgery on 10/20/2021 for Right mastectomy.She did not have to have LN's removed.  She will see oncologist on Monday but only to find out if she requires anti-estrogens. Right TKA    Patient Stated Goals Be reassessed after surgery    Currently in Pain? Yes    Pain Score 3     Pain Location Chest   at those 2 spots on his chest                              [facility] Adult PT Treatment/Exercise - 12/01/21 0001       Exercises   Exercises Shoulder      Shoulder Exercises: Supine   Protraction AROM;Right;10 reps    Diagonals AROM;Right;10 reps    Other Supine Exercises reviewed supine dowel exercise      Shoulder Exercises: Sidelying   ABduction AROM;Right;10 reps   added deep breath at the  top   Other Sidelying Exercises small circles with hand pointed to ceiling instructed pt to do at home with varying speeds and direction, stop and starts, figure 8s with smooth pattern of movement    Other Sidelying Exercises right arm on chest with backward thoracic rotation      Shoulder Exercises: Standing   Other Standing Exercises standing dowel exercise                     PT Education - 12/01/21 1358     Education Details right shoulder ROM in standing and sidelying    Person(s) Educated Patient    Methods Explanation;Demonstration;Handout    Comprehension Verbalized understanding;Returned demonstration                 PT Long Term Goals - 11/17/21 1426       PT LONG TERM GOAL #1   Title Pt will restore right shoulder AROM to pre-surgical levels    Time 6    Period Weeks    Status On-going    Target Date 12/29/21      PT LONG TERM GOAL #2   Title Right  shoulder abd will be atleast 160 degrees    Time 3    Period Weeks    Status New    Target Date 12/08/21      PT LONG TERM GOAL #3   Title Pt will have decreased complaints of swelling by 50%    Time 6    Period Weeks    Status New    Target Date 12/29/21      PT LONG TERM GOAL #4   Title Pt will be independent in self MLD to decrease swelling    Time 6    Period Weeks    Status New    Target Date 12/29/21      PT LONG TERM GOAL #5   Title Pt will be educated in a ROM and strengthening HEP and will be independent    Time 6    Period Weeks    Status New    Target Date 12/29/21                   Plan - 12/01/21 1359     Clinical Impression Statement Pt reponded well to PT today and reported that she felt better at end of session.  She is interested in increasing her exercise and is anxious to return to aquatic exercise. She is doing well with ROM, but will benefit from continues manual work and AROM to right upper quadrant to decrease pain and stretch right chest.  Pt still with scabbed area at central insicion and contraction of incsion also but skin aournd chest is moveable.    Personal Factors and Comorbidities Comorbidity 1    Comorbidities Right breast CAncer, Right TKA    Stability/Clinical Decision Making Stable/Uncomplicated    Rehab Potential Excellent    PT Frequency 2x / week    PT Treatment/Interventions ADLs/Self Care Home Management;Therapeutic exercise;Manual techniques;Patient/family education;Manual lymph drainage;Scar mobilization;Passive range of motion    PT Next Visit Plan give pt handout for knitted knockers. A/ PROM R shoulder, MLD to axillary/chest swelling and instruct pt. progress exercise as indicated. Pt wants to decrease appts since she is doing so well.  Double check with her and cancel appts as indicated    PT Home Exercise Plan 4 post op exs, supine and standing dowel exericse    Consulted and Agree  with Plan of Care Patient              Patient will benefit from skilled therapeutic intervention in order to improve the following deficits and impairments:  Decreased knowledge of precautions, Postural dysfunction, Decreased scar mobility, Decreased skin integrity, Pain, Increased edema, Decreased strength  Visit Diagnosis: Stiffness of right shoulder, not elsewhere classified  Localized edema  Abnormal posture  Ductal carcinoma in situ of right breast     Problem List Patient Active Problem List   Diagnosis Date Noted   Ductal carcinoma in situ (DCIS) of right breast 11/21/2021   S/P mastectomy, right 10/20/2021   Posterior vitreous detachment of left eye 11/30/2020   Vitreous floaters, left 11/30/2020   Round hole of right eye 11/30/2020   Vitreous floaters, right 11/30/2020   Nuclear sclerotic cataract of right eye 11/30/2020   Nuclear sclerotic cataract of left eye 11/30/2020   Round hole of retina without detachment 11/30/2020   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   S/P right TKA 08/28/2017   S/P total knee replacement 08/28/2017   Donato Heinz. Owens Shark PT  Norwood Levo, PT 12/01/2021, 2:04 PM  [place] @ [place] [place], [place], [address] Phone: [phone]   Fax:  [phone]  Name: SARAPHINA LAUDERBAUGH MRN: [id number] Date of Birth: [birth year]

## 2021-12-07 ENCOUNTER — Ambulatory Visit: Payer: Medicare PPO

## 2021-12-07 ENCOUNTER — Other Ambulatory Visit: Payer: Self-pay

## 2021-12-07 DIAGNOSIS — D0511 Intraductal carcinoma in situ of right breast: Secondary | ICD-10-CM

## 2021-12-07 DIAGNOSIS — R600 Localized edema: Secondary | ICD-10-CM

## 2021-12-07 DIAGNOSIS — R293 Abnormal posture: Secondary | ICD-10-CM

## 2021-12-07 DIAGNOSIS — M25611 Stiffness of right shoulder, not elsewhere classified: Secondary | ICD-10-CM

## 2021-12-07 NOTE — Therapy (Signed)
[place] @ [address] [place], [place], [address] Phone: [phone]   Fax:  [phone]  Physical Therapy Treatment  Patient Details  Name: Victoria Wallace MRN: [id number] Date of Birth: 1951-01-04 Referring Provider (PT): Dr. Donne Hazel   Encounter Date: 12/07/2021   PT End of Session - 12/07/21 1338     Visit Number 5    Number of Visits 14    Date for PT Re-Evaluation 12/29/21    PT Start Time 0630    PT Stop Time 1601    PT Time Calculation (min) 51 min    Activity Tolerance Patient tolerated treatment well    Behavior During Therapy [facility] for tasks assessed/performed             Past Medical History:  Diagnosis Date   Abnormal dexamethasone suppression test 06/2014   recieved info from patient   Arthritis    Asymptomatic microscopic hematuria    monitored by PCP    Chronic constipation    Encounter for hepatitis C screening test for low risk patient 05/18/2017   Flu vaccine 08/13/2017   History of colonoscopy 2012   Hypertension    shingles vaccine 2011   recieved info from patient   Tetanus toxoid vaccination administered within the past year 05/18/2017   Uterine prolapse    Wears [place]    Past Surgical History:  Procedure Laterality Date   BREAST BIOPSY Right    EYE SURGERY     Retinol laser surgery   LUMBAR LAMINECTOMY Bilateral 2002   MOUTH SURGERY     Dental and gum   TOTAL 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   Procedure: RIGHT TOTAL KNEE ARTHROPLASTY;  Surgeon: Paralee Cancel, MD;  [facility];  Service: Orthopedics;  Laterality: Right;  70 mins   TOTAL MASTECTOMY Right 10/20/2021   Procedure: RIGHT TOTAL MASTECTOMY;  Surgeon: Rolm Bookbinder, MD;  Location: [facility];  Service: General;  Laterality: Right;    There were no vitals filed for this visit.   Subjective Assessment - 12/07/21 1301     Subjective I would like for next Wed. visit to be my last  because I would like to go to the pool. THe last big scab just came off 2 days ago. I have been riding my bike and walking. I am feeling much better overall. Its more discomfort now than pain    Pertinent History Diagnosed with right DCIS and had  surgery on 10/20/2021 for Right mastectomy.She did not have to have LN's removed.  She will see oncologist on Monday but only to find out if she requires anti-estrogens. Right TKA    Patient Stated Goals Be reassessed after surgery    Currently in Pain? No/denies   discomfort but not pain.   Pain Score 0-No pain    Pain Onset More than a month ago    Pain Frequency Intermittent                               [facility] Adult PT Treatment/Exercise - 12/07/21 0001       Shoulder Exercises: Supine   Other Supine Exercises supine AROM shoulder flex, scaption, horizontal abd x 5 ea bilaterally      Shoulder Exercises: Pulleys   Flexion and abduction 2 minutes ea     Manual Therapy   Manual Therapy Soft tissue mobilization;Manual Lymphatic Drainage (MLD);Passive ROM  Soft tissue mobilization to pectoralis, lats in supine and in SL to UT, scapular area and lats with cocoa butter    Manual Lymphatic Drainage (MLD) pt istructed in MLD to supraclavicular, right axillary and inguinal LN, and right axillo inguinal pathway repeating steps and ending with LN's    Passive ROM to R shoulder in to flexion, ER, and abduction                          PT Long Term Goals - 11/17/21 1426       PT LONG TERM GOAL #1   Title Pt will restore right shoulder AROM to pre-surgical levels    Time 6    Period Weeks    Status On-going    Target Date 12/29/21      PT LONG TERM GOAL #2   Title Right shoulder abd will be atleast 160 degrees    Time 3    Period Weeks    Status New    Target Date 12/08/21      PT LONG TERM GOAL #3   Title Pt will have decreased complaints of swelling by 50%    Time 6    Period Weeks    Status New     Target Date 12/29/21      PT LONG TERM GOAL #4   Title Pt will be independent in self MLD to decrease swelling    Time 6    Period Weeks    Status New    Target Date 12/29/21      PT LONG TERM GOAL #5   Title Pt will be educated in a ROM and strengthening HEP and will be independent    Time 6    Period Weeks    Status New    Target Date 12/29/21                   Plan - 12/07/21 1400     Clinical Impression Statement Performed STM to right upper quarter, PROM to right shoulder, AROM exercises and instructed in MLD techniques to right lateral trunk/axillary region.  Pt is progressing very nicely with shoulder ROM.  She is still unhealed at the T junction and was advised she can not swim until that area is fully healed.  She liked the stretch she felt with AROM exercises. She took a knitted knocker.  She would like to be discharged next visit.    Personal Factors and Comorbidities Comorbidity 1    Comorbidities Right breast CAncer, Right TKA    Stability/Clinical Decision Making Stable/Uncomplicated    Rehab Potential Excellent    PT Frequency 2x / week    PT Treatment/Interventions ADLs/Self Care Home Management;Therapeutic exercise;Manual techniques;Patient/family education;Manual lymph drainage;Scar mobilization;Passive range of motion    PT Next Visit Plan A/ PROM R shoulder, MLD to axillary/chest swelling and instruct pt., STM prn progress exercise as indicated. Pt wants to decrease appts since she is doing so well. DC next visit    PT Home Exercise Plan 4 post op exs, supine and standing dowel exericse    Consulted and Agree with Plan of Care Patient             Patient will benefit from skilled therapeutic intervention in order to improve the following deficits and impairments:  Decreased knowledge of precautions, Postural dysfunction, Decreased scar mobility, Decreased skin integrity, Pain, Increased edema, Decreased strength  Visit Diagnosis: Stiffness of  right shoulder, not  elsewhere classified  Localized edema  Abnormal posture  Ductal carcinoma in situ of right breast     Problem List Patient Active Problem List   Diagnosis Date Noted   Ductal carcinoma in situ (DCIS) of right breast 11/21/2021   S/P mastectomy, right 10/20/2021   Posterior vitreous detachment of left eye 11/30/2020   Vitreous floaters, left 11/30/2020   Round hole of right eye 11/30/2020   Vitreous floaters, right 11/30/2020   Nuclear sclerotic cataract of right eye 11/30/2020   Nuclear sclerotic cataract of left eye 11/30/2020   Round hole of retina without detachment 11/30/2020   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   S/P right TKA 08/28/2017   S/P total knee replacement 08/28/2017    Claris Pong, PT 12/07/2021, 2:04 PM  [facility] @ [place] [place], [place], [address] Phone: [phone]   Fax:  [phone]  Name: ELISHIA KACZOROWSKI MRN: [id number] Date of Birth: [birth year]

## 2021-12-14 ENCOUNTER — Other Ambulatory Visit: Payer: Self-pay

## 2021-12-14 ENCOUNTER — Ambulatory Visit: Payer: Medicare PPO

## 2021-12-14 DIAGNOSIS — D0511 Intraductal carcinoma in situ of right breast: Secondary | ICD-10-CM | POA: Diagnosis not present

## 2021-12-14 DIAGNOSIS — M25611 Stiffness of right shoulder, not elsewhere classified: Secondary | ICD-10-CM

## 2021-12-14 DIAGNOSIS — R600 Localized edema: Secondary | ICD-10-CM

## 2021-12-14 DIAGNOSIS — R293 Abnormal posture: Secondary | ICD-10-CM

## 2021-12-14 NOTE — Patient Instructions (Signed)
Over Head Pull: Narrow    Cancer Rehab [phone]   On back, knees bent, feet flat, band across thighs, elbows straight but relaxed. Pull hands apart (start). Keeping elbows straight, bring arms up and over head, hands toward floor. Keep pull steady on band. Hold momentarily. Return slowly, keeping pull steady, back to start.  Repeat _5-10__ times. Band color __yellow____   Side Pull: Double Arm   On back, knees bent, feet flat. Arms perpendicular to body, shoulder level, elbows straight but relaxed. Pull arms out to sides, elbows straight. Resistance band comes across collarbones, hands toward floor. Hold momentarily. Slowly return to starting position. Repeat _5-10__ times. Band color _yellow____   Shoulder Rotation: Double Arm   On back, knees bent, feet flat, elbows tucked at sides, bent 90, hands palms up. Pull hands apart and down toward floor, keeping elbows near sides. Hold momentarily. Slowly return to starting position. Repeat _5-10__ times. Band color __yellow____

## 2021-12-14 NOTE — Therapy (Signed)
[facility] @ [address] [place], [place], [address] Phone: [phone]   Fax:  [phone]  Physical Therapy Treatment  Patient Details  Name: Victoria Wallace MRN: [id number] Date of Birth: 1951/06/29 Referring Provider (PT): Dr. Donne Hazel   Encounter Date: 12/14/2021   PT End of Session - 12/14/21 1401     Visit Number 6    Number of Visits 14    Date for PT Re-Evaluation 12/29/21    PT Start Time 1300    PT Stop Time 1400    PT Time Calculation (min) 60 min    Activity Tolerance Patient tolerated treatment well    Behavior During Therapy [facility] for tasks assessed/performed             Past Medical History:  Diagnosis Date   Abnormal dexamethasone suppression test 06/2014   recieved info from patient   Arthritis    Asymptomatic microscopic hematuria    monitored by PCP    Chronic constipation    Encounter for hepatitis C screening test for low risk patient 05/18/2017   Flu vaccine 08/13/2017   History of colonoscopy 2012   Hypertension    shingles vaccine 2011   recieved info from patient   Tetanus toxoid vaccination administered within the past year 05/18/2017   Uterine prolapse    Wears [place]    Past Surgical History:  Procedure Laterality Date   BREAST BIOPSY Right    EYE SURGERY     Retinol laser surgery   LUMBAR LAMINECTOMY Bilateral 2002   MOUTH SURGERY     Dental and gum   TOTAL 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   Procedure: RIGHT TOTAL KNEE ARTHROPLASTY;  Surgeon: Paralee Cancel, MD;  Location: [facility];  Service: Orthopedics;  Laterality: Right;  70 mins   TOTAL MASTECTOMY Right 10/20/2021   Procedure: RIGHT TOTAL MASTECTOMY;  Surgeon: Rolm Bookbinder, MD;  Location: [facility];  Service: General;  Laterality: Right;    There were no vitals filed for this visit.   Subjective Assessment - 12/14/21 1300     Subjective Incision is looking better but its still there. I  feel like I am ready to graduate. I can sleep on my side again, and I was able to sleep last night without hugging a pillow. I tried the knitted knocker and it was not comfortable    Pertinent History Diagnosed with right DCIS and had  surgery on 10/20/2021 for Right mastectomy.She did not have to have LN's removed.  She will see oncologist on Monday but only to find out if she requires anti-estrogens. Right TKA    Currently in Pain? Yes    Pain Score 2     Pain Location Chest    Pain Orientation Right    Pain Descriptors / Indicators Sore    Pain Type Surgical pain                [facility] PT Assessment - 12/14/21 0001       Assessment   Medical Diagnosis Right breast Cancer    Referring Provider (PT) Dr. Donne Hazel    Onset Date/Surgical Date 10/20/21      Prior Function   Level of Independence Independent      Cognition   Overall Cognitive Status Within Functional Limits for tasks assessed      AROM   Right Shoulder Extension 70 Degrees    Right Shoulder Flexion 164 Degrees    Right Shoulder ABduction  174 Degrees    Right Shoulder Internal Rotation 75 Degrees    Right Shoulder External Rotation 102 Degrees                           [facility] Adult PT Treatment/Exercise - 12/14/21 0001       Shoulder Exercises: Supine   Horizontal ABduction Strengthening;Both;10 reps    Theraband Level (Shoulder Horizontal ABduction) Level 1 (Yellow)    External Rotation Strengthening;Both;10 reps    Theraband Level (Shoulder External Rotation) Level 1 (Yellow)    Flexion Strengthening;Both;10 reps    Theraband Level (Shoulder Flexion) Level 1 (Yellow)      Shoulder Exercises: Pulleys   Flexion 2 minutes    ABduction 2 minutes      Manual Therapy   Manual Lymphatic Drainage (MLD) pt istructed in MLD to supraclavicular, right axillary and inguinal LN, and right axillo inguinal pathway, and right chest swelling toward pathway, then repeating steps and ending with LN's                      PT Education - 12/14/21 1404     Education Details Supine scapular series, flexion, ER, horizontal abd with yellow band x 10    Person(s) Educated Patient    Methods Demonstration;Handout    Comprehension Returned demonstration                 PT Long Term Goals - 12/14/21 1312       PT LONG TERM GOAL #1   Title Pt will restore right shoulder AROM to pre-surgical levels    Time 6    Period Weeks    Status Achieved    Target Date 12/14/21      PT LONG TERM GOAL #2   Title Right shoulder abd will be atleast 160 degrees    Time 3    Period Weeks    Status Achieved    Target Date 12/14/21      PT LONG TERM GOAL #3   Title Pt will have decreased complaints of swelling by 50%    Time 6    Period Weeks    Status Achieved    Target Date 12/14/21      PT LONG TERM GOAL #4   Title Pt will be independent in self MLD to decrease swelling    Time 6    Period Weeks    Status Achieved    Target Date 12/14/21      PT LONG TERM GOAL #5   Title Pt will be educated in a ROM and strengthening HEP and will be independent    Time 6    Period Weeks    Status Achieved    Target Date 12/14/21                   Plan - 12/14/21 1405     Clinical Impression Statement Pt was reassessed for discharge.  She has achieved all goals established at initial evaluation.  ROM has been restored to pre-surgery levels, She is independent with her HEP, and she notes improved overall swelling.  We reviewed manual lymph drainage and she is using improved technique and has a better understanding. She knows not to go to the pool until the last scabbed area at the T junction has fully healed. She was educated in supine exercises with yellow band for flexion, horizontal abduction, and bilateral ER and was able to  return demonstrate good form.  She was educated how to desensitize her skin using different fabrics. She is discharged to her HEP, but was advised to  call with any questions or concerns.    Personal Factors and Comorbidities Comorbidity 1    Comorbidities Right breast CAncer, Right TKA    Stability/Clinical Decision Making Stable/Uncomplicated    Rehab Potential Excellent    PT Frequency 2x / week    PT Duration 6 weeks    PT Treatment/Interventions ADLs/Self Care Home Management;Therapeutic exercise;Manual techniques;Patient/family education;Manual lymph drainage;Scar mobilization;Passive range of motion    PT Next Visit Plan DC to HEP    PT Home Exercise Plan 4 post op exs, supine and standing dowel exericse    Consulted and Agree with Plan of Care Patient             Patient will benefit from skilled therapeutic intervention in order to improve the following deficits and impairments:  Decreased knowledge of precautions, Postural dysfunction, Decreased scar mobility, Decreased skin integrity, Pain, Increased edema, Decreased strength  Visit Diagnosis: Stiffness of right shoulder, not elsewhere classified  Localized edema  Abnormal posture  Ductal carcinoma in situ of right breast     Problem List Patient Active Problem List   Diagnosis Date Noted   Ductal carcinoma in situ (DCIS) of right breast 11/21/2021   S/P mastectomy, right 10/20/2021   Posterior vitreous detachment of left eye 11/30/2020   Vitreous floaters, left 11/30/2020   Round hole of right eye 11/30/2020   Vitreous floaters, right 11/30/2020   Nuclear sclerotic cataract of right eye 11/30/2020   Nuclear sclerotic cataract of left eye 11/30/2020   Round hole of retina without detachment 11/30/2020   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 Patient goals were met. Patient is being discharged due to meeting the stated rehab goals.   Claris Pong, PT 12/14/2021, 2:11 PM  [facility] @ [address] [place] [place], [place], [address] Phone: [phone]   Fax:  [phone]  Name: Victoria Wallace MRN: [id number] Date of Birth: Jan 07, 1951

## 2022-01-09 ENCOUNTER — Encounter (INDEPENDENT_AMBULATORY_CARE_PROVIDER_SITE_OTHER): Payer: Medicare PPO | Admitting: Ophthalmology

## 2022-01-23 ENCOUNTER — Ambulatory Visit (INDEPENDENT_AMBULATORY_CARE_PROVIDER_SITE_OTHER): Payer: Medicare PPO | Admitting: Ophthalmology

## 2022-01-23 ENCOUNTER — Other Ambulatory Visit: Payer: Self-pay

## 2022-01-23 ENCOUNTER — Encounter (INDEPENDENT_AMBULATORY_CARE_PROVIDER_SITE_OTHER): Payer: Self-pay | Admitting: Ophthalmology

## 2022-01-23 DIAGNOSIS — H43812 Vitreous degeneration, left eye: Secondary | ICD-10-CM

## 2022-01-23 DIAGNOSIS — H43813 Vitreous degeneration, bilateral: Secondary | ICD-10-CM | POA: Diagnosis not present

## 2022-01-23 DIAGNOSIS — H33321 Round hole, right eye: Secondary | ICD-10-CM | POA: Diagnosis not present

## 2022-01-23 DIAGNOSIS — H2511 Age-related nuclear cataract, right eye: Secondary | ICD-10-CM | POA: Diagnosis not present

## 2022-01-23 DIAGNOSIS — D3131 Benign neoplasm of right choroid: Secondary | ICD-10-CM

## 2022-01-23 DIAGNOSIS — H3321 Serous retinal detachment, right eye: Secondary | ICD-10-CM | POA: Diagnosis not present

## 2022-01-23 DIAGNOSIS — H43811 Vitreous degeneration, right eye: Secondary | ICD-10-CM | POA: Diagnosis not present

## 2022-01-23 NOTE — Assessment & Plan Note (Signed)
Minor OD, no change over the interval

## 2022-01-23 NOTE — Progress Notes (Signed)
01/23/2022     CHIEF COMPLAINT Patient presents for  Chief Complaint  Patient presents with   Retina Follow Up      HISTORY OF PRESENT ILLNESS: Victoria Wallace is a 71 y.o. female who presents to the clinic today for:   HPI     Retina Follow Up           Diagnosis: Other   Laterality: both eyes   Onset: 1 year ago   Severity: mild   Duration: 1 year   Course: stable         Comments   1 year fu ou and OCT  Pt states VA OU stable since last visit. Pt denies FOL, floaters, or ocular pain OU.        Last edited by Kendra Opitz, COA on 01/23/2022  1:40 PM.      Referring physician: Michael Boston, MD [place],  [place] [address]  HISTORICAL INFORMATION:   Selected notes from the [facility]: No current outpatient medications on file. (Ophthalmic Drugs)   No current facility-administered medications for this visit. (Ophthalmic Drugs)   Current Outpatient Medications (Other)  Medication Sig   cholecalciferol (VITAMIN D3) 25 MCG (1000 UNIT) tablet Take 1,000 Units by mouth daily.   estradiol (ESTRACE) 0.1 MG/GM vaginal cream Place 1 Applicatorful vaginally 2 (two) times a week.   Estradiol (VAGIFEM) 10 MCG TABS vaginal tablet Place vaginally once a week.   hydrochlorothiazide (HYDRODIURIL) 25 MG tablet Take 25 mg by mouth daily.   lisinopril (PRINIVIL,ZESTRIL) 40 MG tablet Take 40 mg by mouth daily.   metoprolol succinate (TOPROL-XL) 100 MG 24 hr tablet Take 100 mg by mouth daily.   Multiple Vitamin (MULTIVITAMIN WITH MINERALS) TABS tablet Take 1 tablet by mouth daily.   polyethylene glycol (MIRALAX / GLYCOLAX) packet Take 17 g by mouth 2 (two) times daily. (Patient taking differently: Take 17 g by mouth once.)   tamoxifen (NOLVADEX) 20 MG tablet Take 1 tablet (20 mg total) by mouth daily.   No current facility-administered medications for this visit. (Other)      REVIEW OF  SYSTEMS:    ALLERGIES Allergies  Allergen Reactions   Tape     STICKY PART ON HEART MONITOR    Codeine Rash   Penicillins Swelling and Rash    PT WAS ABOUT 71 YEARS OLD  Has patient had a PCN reaction causing immediate rash, facial/tongue/throat swelling, SOB or lightheadedness with hypotension: Yes Has patient had a PCN reaction causing severe rash involving mucus membranes or skin necrosis: Unknown Has patient had a PCN reaction that required hospitalization: No Has patient had a PCN reaction occurring within the last 10 years: No If all of the above answers are "NO", then may proceed with Cephalosporin use.     PAST MEDICAL HISTORY Past Medical History:  Diagnosis Date   Abnormal dexamethasone suppression test 06/2014   recieved info from patient   Arthritis    Asymptomatic microscopic hematuria    monitored by PCP    Chronic constipation    Encounter for hepatitis C screening test for low risk patient 05/18/2017   Flu vaccine 08/13/2017   History of colonoscopy 2012   Hypertension    shingles vaccine 2011   recieved info from patient   Tetanus toxoid vaccination administered within the past year 05/18/2017   Uterine prolapse    Wears [place]   Past Surgical History:  Procedure Laterality Date   BREAST BIOPSY Right    EYE SURGERY     Retinol laser surgery   LUMBAR LAMINECTOMY Bilateral 2002   MOUTH SURGERY     Dental and gum   TOTAL 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   Procedure: RIGHT TOTAL KNEE ARTHROPLASTY;  Surgeon: Paralee Cancel, MD;  [facility];  Service: Orthopedics;  Laterality: Right;  70 mins   TOTAL MASTECTOMY Right 10/20/2021   Procedure: RIGHT TOTAL MASTECTOMY;  Surgeon: Rolm Bookbinder, MD;  Location: [facility];  Service: General;  Laterality: Right;    FAMILY HISTORY Family History  Problem Relation Age of Onset   Breast cancer Maternal Aunt     SOCIAL HISTORY Social History   Tobacco Use   Smoking status: Never    Smokeless tobacco: Never  Vaping Use   Vaping Use: Never used  Substance Use Topics   Alcohol use: Yes    Alcohol/week: 1.0 standard drink    Types: 1 Glasses of wine per week    Comment: rare   Drug use: No         OPHTHALMIC EXAM:  Base Eye Exam     Visual Acuity (ETDRS)       Right Left   Dist [place] 20/30 -1 20/20 -2   Dist ph [place] 20/15 NI         Tonometry (Tonopen, 1:43 PM)       Right Left   Pressure 17 12         Pupils       Pupils Dark Light Shape React APD   Right PERRL 4 4 Round Minimal None   Left PERRL 4 4 Round Minimal None         Visual Fields (Counting fingers)       Left Right    Full Full         Extraocular Movement       Right Left    Full, Ortho Full, Ortho         Neuro/Psych     Oriented x3: Yes   Mood/Affect: Normal         Dilation     Both eyes: 1.0% Mydriacyl, 2.5% Phenylephrine @ 1:42 PM           Slit Lamp and Fundus Exam     External Exam       Right Left   External Normal Normal         Slit Lamp Exam       Right Left   Lids/Lashes Normal Normal   Conjunctiva/Sclera White and quiet White and quiet   Cornea Clear Clear   Anterior Chamber Deep and quiet Deep and quiet   Iris Round and reactive Round and reactive   Lens Nuclear sclerosis Nuclear sclerosis   Anterior Vitreous Normal Normal         Fundus Exam       Right Left   Posterior Vitreous Posterior vitreous detachment Posterior vitreous detachment   Disc Normal Normal   C/D Ratio 0.6 0.6   Macula Normal Normal   Vessels Normal Normal   Periphery Choroidal nevus, 1 disc diameter, flat, 1 disc diameter size inferonasal to the nerve, no change no high risk features, chorioretinal scarring inferotemporal from laser retinopexy Normal, no holes or tears            IMAGING AND PROCEDURES  Imaging and Procedures for 01/23/22  OCT, Retina - OU - Both Eyes  Right Eye Central Foveal Thickness: 311. Progression has  been stable. Findings include normal foveal contour.   Left Eye Central Foveal Thickness: 306. Progression has been stable. Findings include normal foveal contour.   Notes Incidental posterior vitreous detachment OU             ASSESSMENT/PLAN:  Nuclear sclerotic cataract of right eye The nature of cataract was discussed with the patient as well as the elective nature of surgery. The patient was reassured that surgery at a later date does not put the patient at risk for a worse outcome. It was emphasized that the need for surgery is dictated by the patient's quality of life as influenced by the cataract. Patient was instructed to maintain close follow up with their general eye care doctor.  OU minor, no impact on acute  Posterior vitreous detachment of right eye Minor OU  Choroidal nevus of right eye Minor OD, no change over the interval     ICD-10-CM   1. Posterior vitreous detachment of left eye  H43.812 OCT, Retina - OU - Both Eyes    2. Round hole of right eye  H33.321     3. Nuclear sclerotic cataract of right eye  H25.11     4. Posterior vitreous detachment of right eye  H43.811     5. Choroidal nevus of right eye  D31.31       1.  Bilateral moderate NSC cataract, no change over time, no current impact on acuity  2.  Bilateral PVD.  Stable  3.  History of retinal hole OD stable  4.  History of small choroidal nevus right eye also stable  Ophthalmic Meds Ordered this visit:  No orders of the defined types were placed in this encounter.      Return in about 1 year (around 01/23/2023).  There are no Patient Instructions on file for this visit.   Explained the diagnoses, plan, and follow up with the patient and they expressed understanding.  Patient expressed understanding of the importance of proper follow up care.   Clent Demark Adysen Raphael M.D. Diseases & Surgery of the Retina and Vitreous [facility] 01/23/22     Abbreviations: M  myopia (nearsighted); A astigmatism; H hyperopia (farsighted); P presbyopia; Mrx spectacle prescription;  CTL contact lenses; OD right eye; OS left eye; OU both eyes  XT exotropia; ET esotropia; PEK punctate epithelial keratitis; PEE punctate epithelial erosions; DES dry eye syndrome; MGD meibomian gland dysfunction; ATs artificial tears; PFAT's preservative free artificial tears; [place] nuclear sclerotic cataract; PSC posterior subcapsular cataract; ERM epi-retinal membrane; PVD posterior vitreous detachment; RD retinal detachment; DM diabetes mellitus; DR diabetic retinopathy; NPDR non-proliferative diabetic retinopathy; PDR proliferative diabetic retinopathy; CSME clinically significant macular edema; DME diabetic macular edema; dbh dot blot hemorrhages; CWS cotton wool spot; POAG primary open angle glaucoma; C/D cup-to-disc ratio; HVF humphrey visual field; GVF goldmann visual field; OCT optical coherence tomography; IOP intraocular pressure; BRVO Branch retinal vein occlusion; CRVO central retinal vein occlusion; CRAO central retinal artery occlusion; BRAO branch retinal artery occlusion; RT retinal tear; SB scleral buckle; PPV pars plana vitrectomy; VH Vitreous hemorrhage; PRP panretinal laser photocoagulation; IVK intravitreal kenalog; VMT vitreomacular traction; MH Macular hole;  NVD neovascularization of the disc; NVE neovascularization elsewhere; AREDS age related eye disease study; ARMD age related macular degeneration; POAG primary open angle glaucoma; EBMD epithelial/anterior basement membrane dystrophy; ACIOL anterior chamber intraocular lens; IOL intraocular lens; PCIOL posterior chamber intraocular lens; Phaco/IOL phacoemulsification with intraocular lens  placement; [place] photorefractive keratectomy; LASIK laser assisted in situ keratomileusis; HTN hypertension; DM diabetes mellitus; COPD chronic obstructive pulmonary disease

## 2022-01-23 NOTE — Assessment & Plan Note (Signed)
Minor OU 

## 2022-01-23 NOTE — Assessment & Plan Note (Signed)
The nature of cataract was discussed with the patient as well as the elective nature of surgery. The patient was reassured that surgery at a later date does not put the patient at risk for a worse outcome. It was emphasized that the need for surgery is dictated by the patient's quality of life as influenced by the cataract. Patient was instructed to maintain close follow up with their general eye care doctor.  OU minor, no impact on acute

## 2022-02-20 ENCOUNTER — Encounter: Payer: Self-pay | Admitting: Adult Health

## 2022-02-20 ENCOUNTER — Inpatient Hospital Stay: Payer: Medicare PPO | Attending: Hematology and Oncology | Admitting: Adult Health

## 2022-02-20 ENCOUNTER — Other Ambulatory Visit: Payer: Self-pay

## 2022-02-20 VITALS — BP 130/47 | HR 80 | Temp 97.7°F | Resp 16 | Ht 70.0 in | Wt 205.3 lb

## 2022-02-20 DIAGNOSIS — R252 Cramp and spasm: Secondary | ICD-10-CM | POA: Insufficient documentation

## 2022-02-20 DIAGNOSIS — Z885 Allergy status to narcotic agent status: Secondary | ICD-10-CM | POA: Diagnosis not present

## 2022-02-20 DIAGNOSIS — E669 Obesity, unspecified: Secondary | ICD-10-CM | POA: Insufficient documentation

## 2022-02-20 DIAGNOSIS — R232 Flushing: Secondary | ICD-10-CM | POA: Diagnosis not present

## 2022-02-20 DIAGNOSIS — Z803 Family history of malignant neoplasm of breast: Secondary | ICD-10-CM | POA: Insufficient documentation

## 2022-02-20 DIAGNOSIS — D0511 Intraductal carcinoma in situ of right breast: Secondary | ICD-10-CM | POA: Diagnosis not present

## 2022-02-20 DIAGNOSIS — Z880 Allergy status to penicillin: Secondary | ICD-10-CM | POA: Diagnosis not present

## 2022-02-20 DIAGNOSIS — Z79899 Other long term (current) drug therapy: Secondary | ICD-10-CM | POA: Diagnosis not present

## 2022-02-20 DIAGNOSIS — Z888 Allergy status to other drugs, medicaments and biological substances status: Secondary | ICD-10-CM | POA: Insufficient documentation

## 2022-02-20 DIAGNOSIS — J309 Allergic rhinitis, unspecified: Secondary | ICD-10-CM | POA: Insufficient documentation

## 2022-02-20 DIAGNOSIS — K5730 Diverticulosis of large intestine without perforation or abscess without bleeding: Secondary | ICD-10-CM | POA: Insufficient documentation

## 2022-02-20 NOTE — Progress Notes (Signed)
SURVIVORSHIP VISIT:  BRIEF ONCOLOGIC HISTORY:  Oncology History  Ductal carcinoma in situ (DCIS) of right breast  09/15/2019 Initial Diagnosis   Right breast biopsy upper outer quadrant: DCIS with necrosis high-grade, upper outer posterior: DCIS involving a complex sclerosing lesion, ER 100%, PR 60 to 80%   09/14/2021 Cancer Staging   Staging form: Breast, AJCC 8th Edition - Clinical stage from 09/14/2021: Stage 0 (cTis (DCIS), cN0, cM0, ER+, PR+) - Signed by Gardenia Phlegm, NP on 02/18/2022 Stage prefix: Initial diagnosis Nuclear grade: G3    10/20/2021 Surgery   Right mastectomy: High-grade DCIS 2.2 cm, margins negative, ER 100%, PR 60-80%   10/20/2021 Cancer Staging   Staging form: Breast, AJCC 8th Edition - Pathologic stage from 10/20/2021: Stage 0 (pTis (DCIS), pN0, cM0, ER+, PR+) - Signed by Gardenia Phlegm, NP on 02/18/2022 Stage prefix: Initial diagnosis Nuclear grade: G3    11/2021 -  Anti-estrogen oral therapy   Tamoxifen daily     INTERVAL HISTORY:  Ms. Brener to review her survivorship care plan detailing her treatment course for breast cancer, as well as monitoring long-term side effects of that treatment, education regarding health maintenance, screening, and overall wellness and health promotion.     Overall, Ms. Eilers reports feeling quite well.  She is taking tamoxifen daily with good tolerance.  She has occasional "power surges" of feeling hot, but these are manageable.  She also has manageable vaginal discharge that is not particularly bothersome.    She has some endometrial thickening, and underwent biopsy with her gynecologist that was negative.  She is following with them every 6 months.  She does have a pessary and there was a question as to whether this contributed to the bleeding.  She notes intermittent leg cramping that will occur sometimes at night.  Sometimes its in her calf sometimes it is in her hamstring and it can happen in either leg.   There is no specific pattern.  She wonders what this could be from.  REVIEW OF SYSTEMS:  Review of Systems  Constitutional:  Negative for appetite change, chills, fatigue, fever and unexpected weight change.  HENT:   Negative for hearing loss, lump/mass and trouble swallowing.   Eyes:  Negative for eye problems and icterus.  Respiratory:  Negative for chest tightness, cough and shortness of breath.   Cardiovascular:  Negative for chest pain, leg swelling and palpitations.  Gastrointestinal:  Negative for abdominal distention, abdominal pain, constipation, diarrhea, nausea and vomiting.  Endocrine: Positive for hot flashes (As per interval history).  Genitourinary:  Negative for difficulty urinating.   Musculoskeletal:  Negative for arthralgias.  Skin:  Negative for itching and rash.  Neurological:  Negative for dizziness, extremity weakness, headaches and numbness.  Hematological:  Negative for adenopathy. Does not bruise/bleed easily.  Psychiatric/Behavioral:  Negative for depression. The patient is not nervous/anxious.   Breast: Denies any new nodularity, masses, tenderness, nipple changes, or nipple discharge.      ONCOLOGY TREATMENT TEAM:  1. Surgeon:  Dr. Donne Hazel at [facility] 2. Medical Oncologist: Dr. Lindi Adie      PAST MEDICAL/SURGICAL HISTORY:  Past Medical History:  Diagnosis Date   Abnormal dexamethasone suppression test 06/2014   recieved info from patient   Arthritis    Asymptomatic microscopic hematuria    monitored by PCP    Chronic constipation    Encounter for hepatitis C screening test for low risk patient 05/18/2017   Flu vaccine 08/13/2017   History of colonoscopy 2012  Hypertension    shingles vaccine 2011   recieved info from patient   Tetanus toxoid vaccination administered within the past year 05/18/2017   Uterine prolapse    Wears Pessirey   Past Surgical History:  Procedure Laterality Date   BREAST BIOPSY Right    EYE SURGERY      Retinol laser surgery   LUMBAR LAMINECTOMY Bilateral 2002   MOUTH SURGERY     Dental and gum   TOTAL 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   Procedure: RIGHT TOTAL KNEE ARTHROPLASTY;  Surgeon: Paralee Cancel, MD;  Location: [facility];  Service: Orthopedics;  Laterality: Right;  70 mins   TOTAL MASTECTOMY Right 10/20/2021   Procedure: RIGHT TOTAL MASTECTOMY;  Surgeon: Rolm Bookbinder, MD;  Location: [facility];  Service: General;  Laterality: Right;     ALLERGIES:  Allergies  Allergen Reactions   Tape     STICKY PART ON HEART MONITOR    Codeine Rash   Penicillins Swelling and Rash    PT WAS ABOUT 71 YEARS OLD  Has patient had a PCN reaction causing immediate rash, facial/tongue/throat swelling, SOB or lightheadedness with hypotension: Yes Has patient had a PCN reaction causing severe rash involving mucus membranes or skin necrosis: Unknown Has patient had a PCN reaction that required hospitalization: No Has patient had a PCN reaction occurring within the last 10 years: No If all of the above answers are "NO", then may proceed with Cephalosporin use.      CURRENT MEDICATIONS:  Outpatient Encounter Medications as of 02/20/2022  Medication Sig   cholecalciferol (VITAMIN D3) 25 MCG (1000 UNIT) tablet Take 1,000 Units by mouth daily.   estradiol (ESTRACE) 0.1 MG/GM vaginal cream Place 1 Applicatorful vaginally 2 (two) times a week.   Estradiol 10 MCG TABS vaginal tablet Place vaginally once a week.   hydrochlorothiazide (HYDRODIURIL) 25 MG tablet Take 25 mg by mouth daily.   lisinopril (PRINIVIL,ZESTRIL) 40 MG tablet Take 40 mg by mouth daily.   metoprolol succinate (TOPROL-XL) 100 MG 24 hr tablet Take 100 mg by mouth daily.   Multiple Vitamin (MULTIVITAMIN WITH MINERALS) TABS tablet Take 1 tablet by mouth daily. Pt takes three times a week.   polyethylene glycol (MIRALAX / GLYCOLAX) packet Take 17 g by mouth 2 (two) times daily. (Patient taking differently: Take 17 g by  mouth daily.)   tamoxifen (NOLVADEX) 20 MG tablet Take 1 tablet (20 mg total) by mouth daily.   No facility-administered encounter medications on file as of 02/20/2022.     ONCOLOGIC FAMILY HISTORY:  Family History  Problem Relation Age of Onset   Breast cancer Maternal Aunt      GENETIC COUNSELING/TESTING Not at this time  SOCIAL HISTORY:  Social History   Socioeconomic History   Marital status: Widowed    Spouse name: Not on file   Number of children: Not on file   Years of education: Not on file   Highest education level: Not on file  Occupational History   Not on file  Tobacco Use   Smoking status: Never   Smokeless tobacco: Never  Vaping Use   Vaping Use: Never used  Substance and Sexual Activity   Alcohol use: Yes    Alcohol/week: 1.0 standard drink    Types: 1 Glasses of wine per week    Comment: rare   Drug use: No   Sexual activity: Yes  Other Topics Concern   Not on file  Social History Narrative   Not on  file   Social Determinants of Health   Financial Resource Strain: Not on file  Food Insecurity: Not on file  Transportation Needs: Not on file  Physical Activity: Not on file  Stress: Not on file  Social Connections: Not on file  Intimate Partner Violence: Not on file     OBSERVATIONS/OBJECTIVE:  BP (!) 130/47 (BP Location: Left Arm, Patient Position: Sitting)    Pulse 80    Temp 97.7 F (36.5 C) (Temporal)    Resp 16    Ht _0  (1.778 m)    Wt 205 lb 4.8 oz (93.1 kg)    SpO2 98%    BMI 29.46 kg/m  GENERAL: Patient is a well appearing female in no acute distress HEENT:  Sclerae anicteric.  Oropharynx clear and moist. No ulcerations or evidence of oropharyngeal candidiasis. Neck is supple.  NODES:  No cervical, supraclavicular, or axillary lymphadenopathy palpated.  BREAST EXAM:  right breast s/p mastectomy, no sign of local recurrence, left breast benign LUNGS:  Clear to auscultation bilaterally.  No wheezes or rhonchi. HEART:  Regular rate  and rhythm. No murmur appreciated. ABDOMEN:  Soft, nontender.  Positive, normoactive bowel sounds. No organomegaly palpated. MSK:  No focal spinal tenderness to palpation. Full range of motion bilaterally in the upper extremities. EXTREMITIES:  No peripheral edema.   SKIN:  Clear with no obvious rashes or skin changes. No nail dyscrasia. NEURO:  Nonfocal. Well oriented.  Appropriate affect.   LABORATORY DATA:  None for this visit.  DIAGNOSTIC IMAGING:  None for this visit.      ASSESSMENT AND PLAN:  Ms.. Shell is a pleasant 71 y.o. female with Stage 0 right breast, ER+/PR+/HER2-, diagnosed in 08/2021, treated with mastectomy, and anti-estrogen therapy with Tamoxifen beginning in 11/2021.  She presents to the Survivorship Clinic for our initial meeting and routine follow-up post-completion of treatment for breast cancer.    1. Stage 0 right breast cancer:  Ms. Taff is continuing to recover from definitive treatment for breast cancer. She will follow-up with her medical oncologist, Dr. Lindi Adie in 6 months with history and physical exam per surveillance protocol.  She will continue her anti-estrogen therapy with tamoxifen. Thus far, she is tolerating the tamoxifen well, with minimal side effects. She was instructed to make Dr. Lindi Adie or myself aware if she begins to experience any worsening side effects of the medication and I could see her back in clinic to help manage those side effects, as needed. Her mammogram is due September 2023.   Today, a comprehensive survivorship care plan and treatment summary was reviewed with the patient today detailing her breast cancer diagnosis, treatment course, potential late/long-term effects of treatment, appropriate follow-up care with recommendations for the future, and patient education resources.  A copy of this summary, along with a letter will be sent to the patients primary care provider via mail/fax/In Basket message after todays visit.    2.  Bone health: We discussed how tamoxifen has a protective effect on the bones.  She is happy to hear this.  She has repeat bone density testing in the next 1 to 2 months that is followed by her primary care.  She was given education on specific activities to promote bone health.  3. Cancer screening:  Due to Ms. Dase's history and her age, she should receive screening for skin cancers, colon cancer, and gynecologic cancers.  The information and recommendations are listed on the patient's comprehensive care plan/treatment summary and were reviewed in detail  with the patient.    4. Health maintenance and wellness promotion: Ms. Loscalzo was encouraged to consume 5-7 servings of fruits and vegetables per day. We reviewed the "Nutrition Rainbow" handout.  She was also encouraged to engage in moderate to vigorous exercise for 30 minutes per day most days of the week. We discussed the [employer] fitness program, which is designed for cancer survivors to help them become more physically fit after cancer treatments.  She was instructed to limit her alcohol consumption and continue to abstain from tobacco use.     5. Support services/counseling: It is not uncommon for this period of the patient's cancer care trajectory to be one of many emotions and stressors.  She was given information regarding our available services and encouraged to contact me with any questions or for help enrolling in any of our support group/programs.   6.  Leg cramping: This cramping seems to occur in different muscles through different times during the evening.  This does not sound like a blood clot since it is not limited to one location or 1 leg.  There is no swelling or dull aching associated with it.  I suggested she try taking magnesium supplementation at night to see if that helps.  She has optimized her fluid intake.  Follow up instructions:    -Return to cancer center in 6 months for follow-up with Dr. Lindi Adie -Mammogram  due in September 2023 -Follow up with surgery in 6 months at her request -She is welcome to return back to the Survivorship Clinic at any time; no additional follow-up needed at this time.  -Consider referral back to survivorship as a long-term survivor for continued surveillance  The patient was provided an opportunity to ask questions and all were answered. The patient agreed with the plan and demonstrated an understanding of the instructions.   Total encounter time: 45 minutes face-to-face visit time, chart review, lab review, care coordination, order entry, and documentation of the encounter.  Wilber Bihari, NP 02/20/22 1:05 PM Medical Oncology and Hematology [facility] [place], [place] [address] Tel. [phone]    Fax. [phone]  *Total Encounter Time as defined by the Centers for Medicare and Medicaid Services includes, in addition to the face-to-face time of a patient visit (documented in the note above) non-face-to-face time: obtaining and reviewing outside history, ordering and reviewing medications, tests or procedures, care coordination (communications with other health care professionals or caregivers) and documentation in the medical record.

## 2022-05-08 ENCOUNTER — Ambulatory Visit
Admission: RE | Admit: 2022-05-08 | Discharge: 2022-05-08 | Disposition: A | Discharge status: Home / Self Care | Payer: Medicare PPO | Source: Ambulatory Visit | Attending: Internal Medicine | Admitting: Internal Medicine

## 2022-05-08 DIAGNOSIS — M8580 Other specified disorders of bone density and structure, unspecified site: Secondary | ICD-10-CM

## 2022-06-16 IMAGING — MG MM BREAST LOCALIZATION CLIP
4 series · 6 of 12 positions shown · non-contrast
Comparison: Previous exam(s).
COMPARISON: Previous exam(s).

Addendum:
CLINICAL DATA: Evaluate post biopsy marker clip placement following
stereotactic core needle biopsy of 2 groups of calcifications in the
right breast upper outer quadrant.

EXAM:
3D DIAGNOSTIC RIGHT MAMMOGRAM POST STEREOTACTIC BIOPSY

[R CC synth-2D]
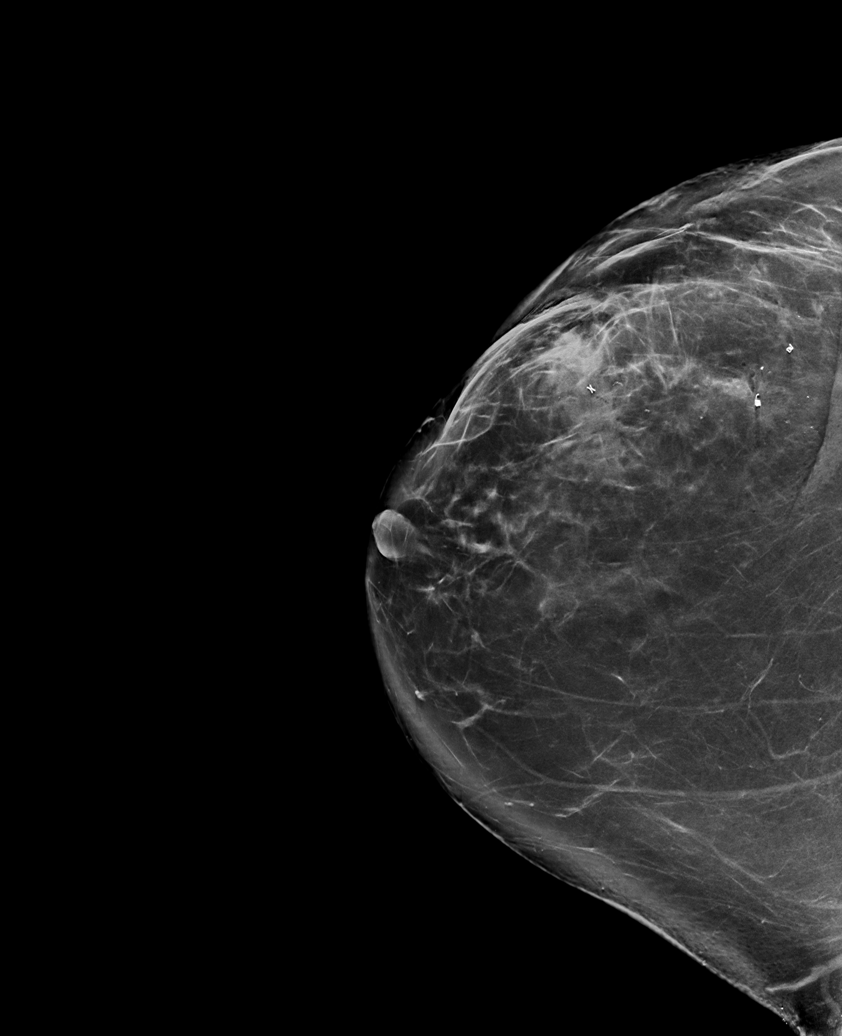

[R LM synth-2D]
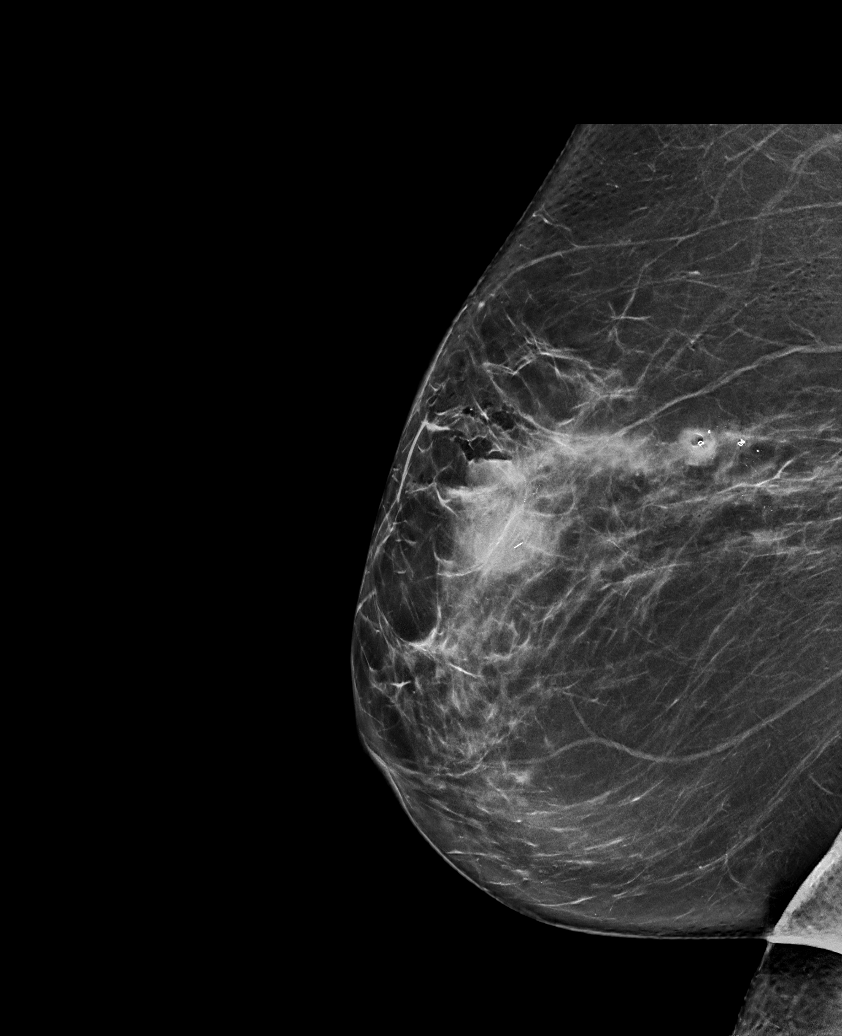

[R CC tomo · 3 of 97 frames shown]
[frame 32/97]
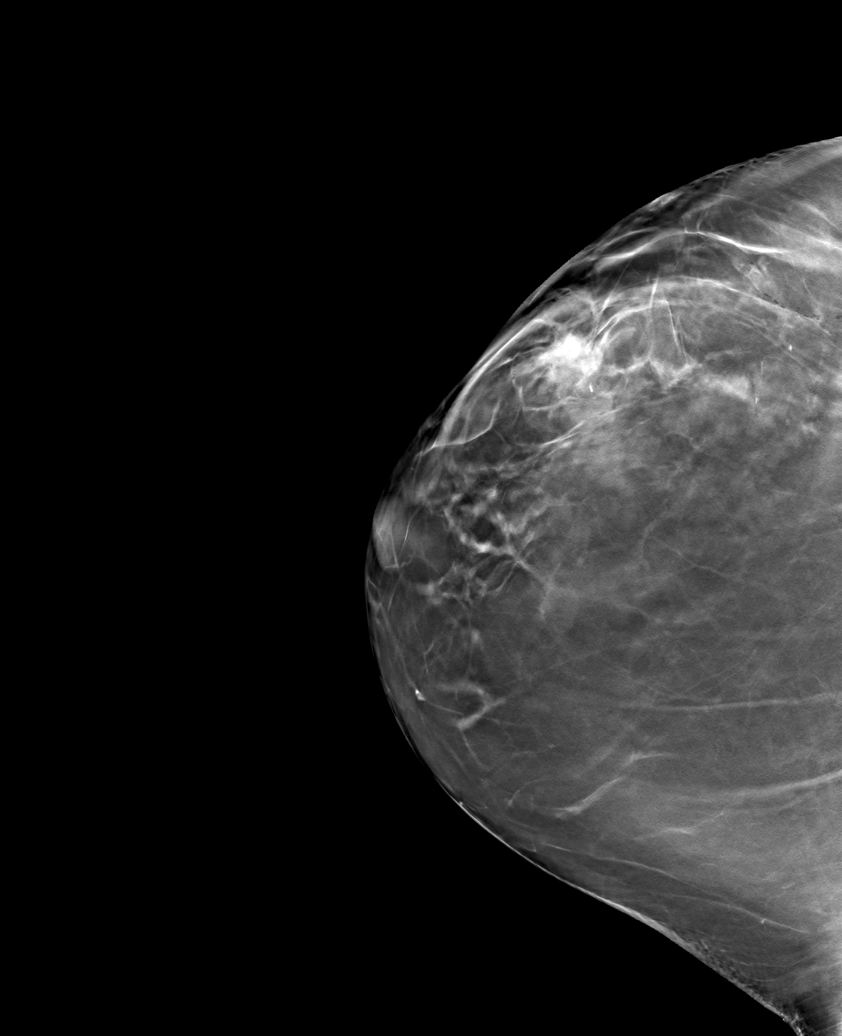
[frame 49/97]
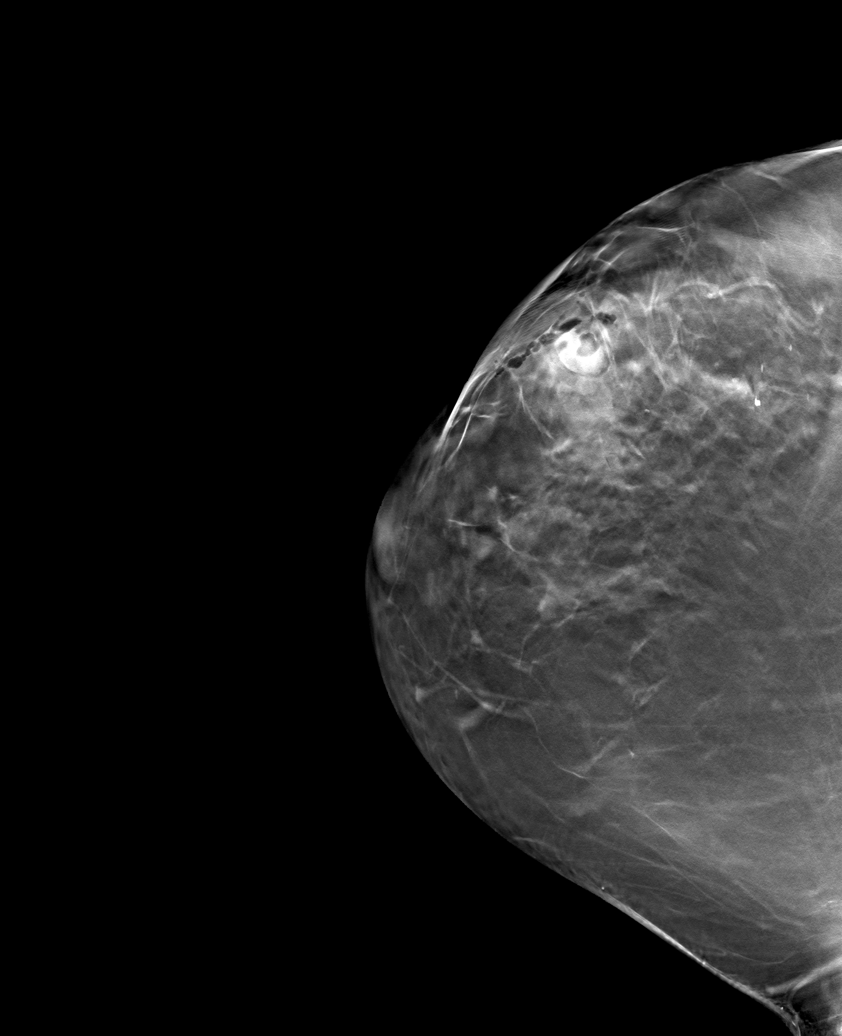
[frame 66/97]
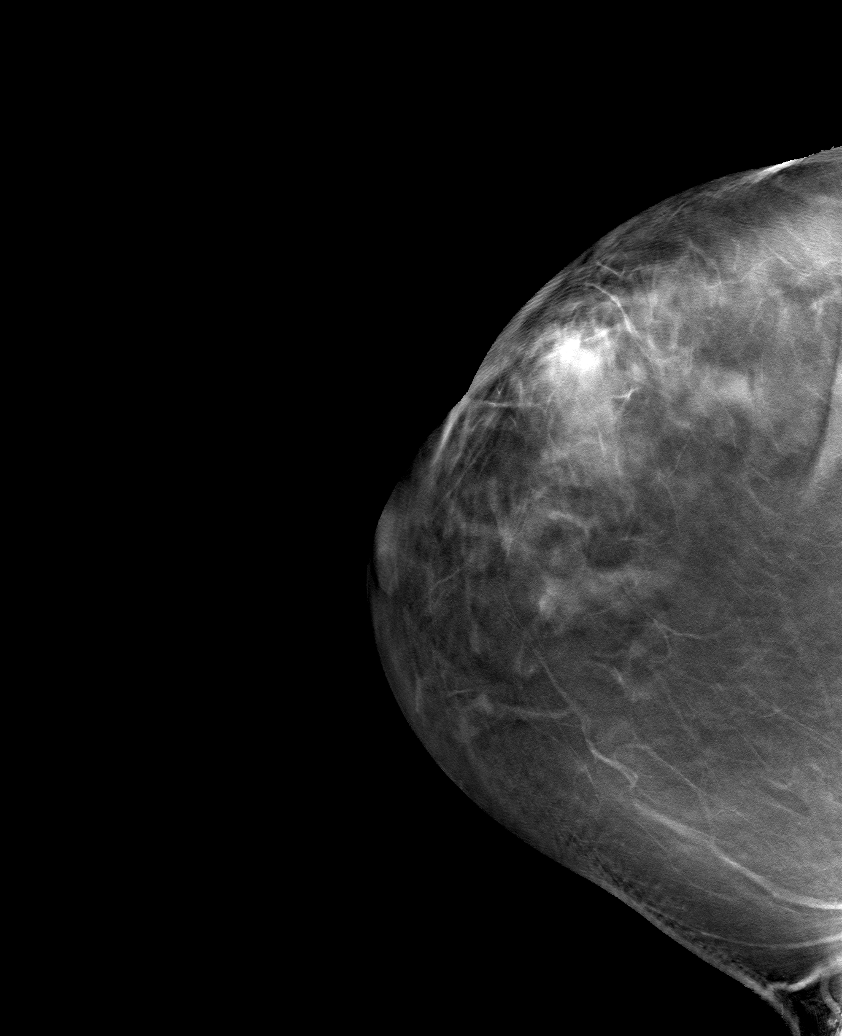

[R LM tomo · tomo slice 41/82.0]
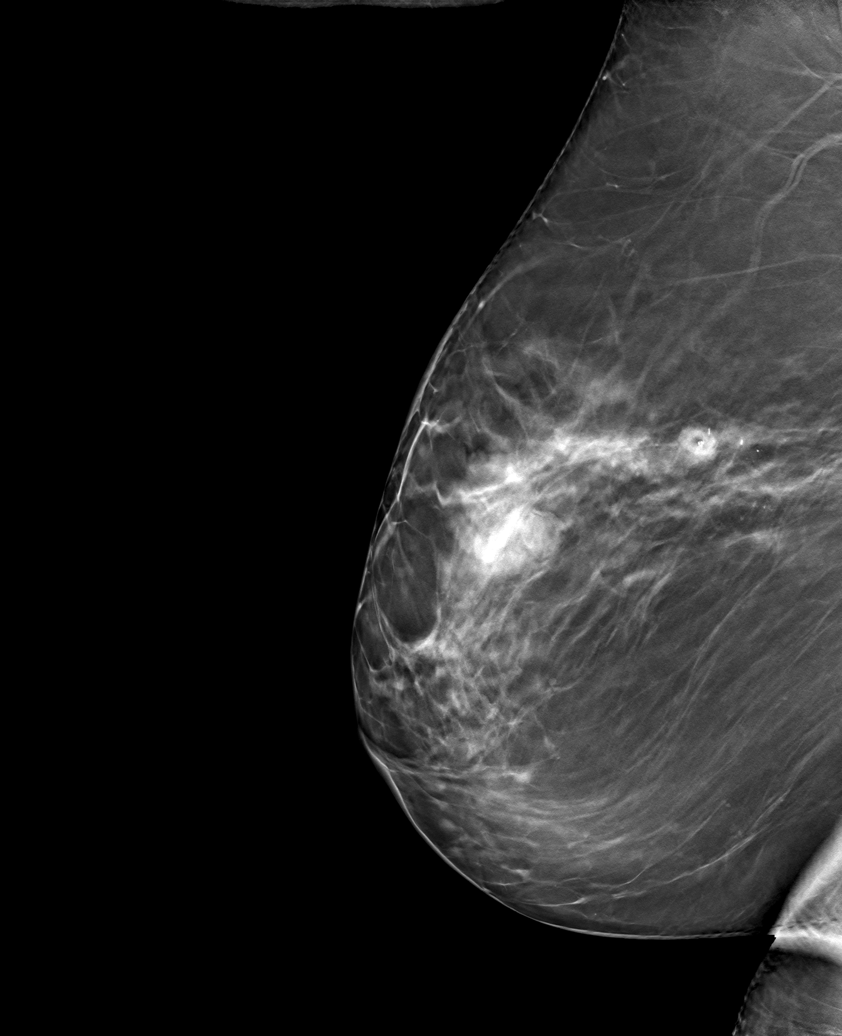

[6 of 12 positions shown; findings below may reference images not displayed]

FINDINGS: 3D Mammographic images were obtained following stereotactic guided
biopsy of anterior and posterior groups of calcifications in the
upper outer right breast. The X shaped biopsy clip lies adjacent to
residual calcifications in the upper outer right breast, middle
depth, and the coil shaped biopsy clip lies adjacent to residual
calcifications in the upper outer quadrant, posterior depth.
IMPRESSION: Appropriate positioning of the X and coil shaped biopsy marking
clips at the site of biopsy in the upper outer right breast as
detailed above.

Final Assessment: Post Procedure Mammograms for Marker Placement

ADDENDUM:
The most posterior calcifications are located up to 2 cm
inferior-posterior to the coil shaped clip. If breast conservation
surgery is considered, these most posterior calcifications would
also need to be localized by radioactive seed prior to surgery.

However, after further review of the earlier diagnostic images of
09/05/2021 and initial screening mammogram of 08/25/2021, there are
nearly contiguous amorphous fine pleomorphic calcifications between
the 2 biopsy sites with an overall extent of approximately 8 cm.
These intervening calcifications would likely require additional
radioactive seed bracketing if breast conservation surgery was
considered.

These results were called by telephone at the time of interpretation
on 09/21/2021 at [DATE] to provider Ben Dhief Frouja, who verbally
acknowledged these results.

*** End of Addendum ***
FINDINGS: 3D Mammographic images were obtained following stereotactic guided
biopsy of anterior and posterior groups of calcifications in the
upper outer right breast. The X shaped biopsy clip lies adjacent to
residual calcifications in the upper outer right breast, middle
depth, and the coil shaped biopsy clip lies adjacent to residual
calcifications in the upper outer quadrant, posterior depth.
IMPRESSION: Appropriate positioning of the X and coil shaped biopsy marking
clips at the site of biopsy in the upper outer right breast as
detailed above.

Final Assessment: Post Procedure Mammograms for Marker Placement

## 2022-08-25 ENCOUNTER — Other Ambulatory Visit: Payer: Self-pay | Admitting: Internal Medicine

## 2022-08-25 DIAGNOSIS — Z1231 Encounter for screening mammogram for malignant neoplasm of breast: Secondary | ICD-10-CM

## 2022-08-25 NOTE — Progress Notes (Signed)
Patient Care Team: Michael Boston, MD as PCP - General (Internal Medicine) Nicholas Lose, MD as Consulting Physician (Hematology and Oncology) Rolm Bookbinder, MD as Consulting Physician (General Surgery)  DIAGNOSIS: No diagnosis found.  SUMMARY OF ONCOLOGIC HISTORY: Oncology History  Ductal carcinoma in situ (DCIS) of right breast  09/15/2019 Initial Diagnosis   Right breast biopsy upper outer quadrant: DCIS with necrosis high-grade, upper outer posterior: DCIS involving a complex sclerosing lesion, ER 100%, PR 60 to 80%   09/14/2021 Cancer Staging   Staging form: Breast, AJCC 8th Edition - Clinical stage from 09/14/2021: Stage 0 (cTis (DCIS), cN0, cM0, ER+, PR+) - Signed by Gardenia Phlegm, NP on 02/18/2022 Stage prefix: Initial diagnosis Nuclear grade: G3   10/20/2021 Surgery   Right mastectomy: High-grade DCIS 2.2 cm, margins negative, ER 100%, PR 60-80%   10/20/2021 Cancer Staging   Staging form: Breast, AJCC 8th Edition - Pathologic stage from 10/20/2021: Stage 0 (pTis (DCIS), pN0, cM0, ER+, PR+) - Signed by Gardenia Phlegm, NP on 02/18/2022 Stage prefix: Initial diagnosis Nuclear grade: G3   11/2021 -  Anti-estrogen oral therapy   Tamoxifen daily     CHIEF COMPLIANT: Follow-up breast cancer on tamoxifen  INTERVAL HISTORY: Victoria Wallace is a 71 y.o. female is here because of recent diagnosis of DCIS of the right breast. She presents to the clinic today for a follow-up    ALLERGIES:  is allergic to tape, codeine, and penicillins.  MEDICATIONS:  Current Outpatient Medications  Medication Sig Dispense Refill   cholecalciferol (VITAMIN D3) 25 MCG (1000 UNIT) tablet Take 1,000 Units by mouth daily.     estradiol (ESTRACE) 0.1 MG/GM vaginal cream Place 1 Applicatorful vaginally 2 (two) times a week.     Estradiol 10 MCG TABS vaginal tablet Place vaginally once a week.     hydrochlorothiazide (HYDRODIURIL) 25 MG tablet Take 25 mg by mouth daily.      lisinopril (PRINIVIL,ZESTRIL) 40 MG tablet Take 40 mg by mouth daily.     metoprolol succinate (TOPROL-XL) 100 MG 24 hr tablet Take 100 mg by mouth daily.  0   Multiple Vitamin (MULTIVITAMIN WITH MINERALS) TABS tablet Take 1 tablet by mouth daily. Pt takes three times a week.     polyethylene glycol (MIRALAX / GLYCOLAX) packet Take 17 g by mouth 2 (two) times daily. (Patient taking differently: Take 17 g by mouth daily.) 14 each 0   tamoxifen (NOLVADEX) 20 MG tablet Take 1 tablet (20 mg total) by mouth daily. 90 tablet 3   No current facility-administered medications for this visit.    PHYSICAL EXAMINATION: ECOG PERFORMANCE STATUS: {CHL ONC ECOG [id number]}  There were no vitals filed for this visit. There were no vitals filed for this visit.  BREAST:*** No palpable masses or nodules in either right or left breasts. No palpable axillary supraclavicular or infraclavicular adenopathy no breast tenderness or nipple discharge. (exam performed in the presence of a chaperone)  LABORATORY DATA:  I have reviewed the data as listed    Latest Ref Rng & Units 10/12/2021    5:55 PM 08/29/2017    5:31 AM 08/17/2017   11:28 AM  CMP  Glucose 70 - 99 mg/dL 115  105  100   BUN 8 - 23 mg/dL '20  14  21   '$ Creatinine 0.44 - 1.00 mg/dL 0.69  0.70  0.66   Sodium 135 - 145 mmol/L 136  138  138   Potassium 3.5 - 5.1 mmol/L 4.0  3.6  4.1   Chloride 98 - 111 mmol/L 99  106  103   CO2 22 - 32 mmol/L '28  27  28   '$ Calcium 8.9 - 10.3 mg/dL 10.1  8.7  9.7     Lab Results  Component Value Date   WBC 9.0 08/29/2017   HGB 9.2 (L) 08/29/2017   HCT 26.8 (L) 08/29/2017   MCV 95.7 08/29/2017   PLT 185 08/29/2017    ASSESSMENT & PLAN:  No problem-specific Assessment & Plan notes found for this encounter.    No orders of the defined types were placed in this encounter.  The patient has a good understanding of the overall plan. she agrees with it. she will call with any problems that may develop before the  next visit here. Total time spent: 30 mins including face to face time and time spent for planning, charting and co-ordination of care   Suzzette Righter, [place] 08/25/22    I Gardiner Coins am scribing for Dr. Lindi Adie  ***

## 2022-08-29 ENCOUNTER — Inpatient Hospital Stay: Payer: Medicare PPO | Attending: Hematology and Oncology | Admitting: Hematology and Oncology

## 2022-08-29 ENCOUNTER — Other Ambulatory Visit: Payer: Self-pay

## 2022-08-29 DIAGNOSIS — Z170 Estrogen receptor positive status [ER+]: Secondary | ICD-10-CM | POA: Diagnosis not present

## 2022-08-29 DIAGNOSIS — D0511 Intraductal carcinoma in situ of right breast: Secondary | ICD-10-CM | POA: Insufficient documentation

## 2022-08-29 DIAGNOSIS — Z79810 Long term (current) use of selective estrogen receptor modulators (SERMs): Secondary | ICD-10-CM | POA: Diagnosis not present

## 2022-08-29 MED ORDER — POLYETHYLENE GLYCOL 3350 17 G PO PACK: 17.0000 g | PACK | Freq: Every day | ORAL | Status: DC

## 2022-08-29 MED ORDER — TAMOXIFEN CITRATE 20 MG PO TABS: 20.0000 mg | ORAL_TABLET | Freq: Every day | ORAL | 3 refills | Status: DC

## 2022-08-29 MED ORDER — LEVOTHYROXINE SODIUM 25 MCG PO TABS: 25.0000 ug | ORAL_TABLET | Freq: Every day | ORAL | Status: AC

## 2022-08-29 NOTE — Assessment & Plan Note (Addendum)
10/20/2021 right mastectomy: High-grade DCIS 2.2 cm, margins negative, ER 100%, PR 60-80% Current treatment: Tamoxifen 20 mg daily x5 years Tamoxifen toxicities: 1.  Mild hot flashes 2. occasional leg cramps Otherwise she is tolerating it extremely well without any problems or concerns.  Breast cancer surveillance: 1.  Breast exam 08/29/2022: Benign 2. left breast mammogram  Return to clinic in 1 year for follow-up

## 2022-09-19 ENCOUNTER — Ambulatory Visit: Payer: Medicare PPO

## 2022-09-22 ENCOUNTER — Ambulatory Visit: Payer: Medicare PPO

## 2022-09-22 ENCOUNTER — Ambulatory Visit
Admission: RE | Admit: 2022-09-22 | Discharge: 2022-09-22 | Disposition: A | Discharge status: Home / Self Care | Payer: Medicare PPO | Source: Ambulatory Visit | Attending: Internal Medicine | Admitting: Internal Medicine

## 2022-09-22 DIAGNOSIS — Z1231 Encounter for screening mammogram for malignant neoplasm of breast: Secondary | ICD-10-CM

## 2022-12-18 DIAGNOSIS — N83209 Unspecified ovarian cyst, unspecified side: Secondary | ICD-10-CM

## 2022-12-18 HISTORY — DX: Unspecified ovarian cyst, unspecified side: N83.209

## 2022-12-19 ENCOUNTER — Encounter: Payer: Self-pay | Admitting: Psychiatry

## 2022-12-20 ENCOUNTER — Inpatient Hospital Stay: Payer: Medicare PPO

## 2022-12-20 ENCOUNTER — Encounter: Payer: Self-pay | Admitting: Psychiatry

## 2022-12-20 ENCOUNTER — Inpatient Hospital Stay: Payer: Medicare PPO | Attending: Psychiatry | Admitting: Psychiatry

## 2022-12-20 VITALS — BP 129/56 | HR 82 | Temp 98.7°F | Resp 14 | Ht 69.61 in | Wt 202.2 lb

## 2022-12-20 DIAGNOSIS — Z9011 Acquired absence of right breast and nipple: Secondary | ICD-10-CM | POA: Insufficient documentation

## 2022-12-20 DIAGNOSIS — D0511 Intraductal carcinoma in situ of right breast: Secondary | ICD-10-CM | POA: Insufficient documentation

## 2022-12-20 DIAGNOSIS — N83201 Unspecified ovarian cyst, right side: Secondary | ICD-10-CM | POA: Diagnosis not present

## 2022-12-20 DIAGNOSIS — Z79810 Long term (current) use of selective estrogen receptor modulators (SERMs): Secondary | ICD-10-CM | POA: Insufficient documentation

## 2022-12-20 DIAGNOSIS — R935 Abnormal findings on diagnostic imaging of other abdominal regions, including retroperitoneum: Secondary | ICD-10-CM

## 2022-12-20 DIAGNOSIS — N950 Postmenopausal bleeding: Secondary | ICD-10-CM | POA: Diagnosis not present

## 2022-12-20 DIAGNOSIS — Z803 Family history of malignant neoplasm of breast: Secondary | ICD-10-CM | POA: Insufficient documentation

## 2022-12-20 DIAGNOSIS — M1990 Unspecified osteoarthritis, unspecified site: Secondary | ICD-10-CM | POA: Insufficient documentation

## 2022-12-20 DIAGNOSIS — Z1211 Encounter for screening for malignant neoplasm of colon: Secondary | ICD-10-CM | POA: Diagnosis not present

## 2022-12-20 DIAGNOSIS — N814 Uterovaginal prolapse, unspecified: Secondary | ICD-10-CM | POA: Insufficient documentation

## 2022-12-20 DIAGNOSIS — I10 Essential (primary) hypertension: Secondary | ICD-10-CM | POA: Diagnosis not present

## 2022-12-20 DIAGNOSIS — Z8049 Family history of malignant neoplasm of other genital organs: Secondary | ICD-10-CM | POA: Diagnosis not present

## 2022-12-20 DIAGNOSIS — R9389 Abnormal findings on diagnostic imaging of other specified body structures: Secondary | ICD-10-CM | POA: Insufficient documentation

## 2022-12-20 DIAGNOSIS — N83209 Unspecified ovarian cyst, unspecified side: Secondary | ICD-10-CM

## 2022-12-20 DIAGNOSIS — Z800 Family history of malignant neoplasm of digestive organs: Secondary | ICD-10-CM | POA: Diagnosis not present

## 2022-12-20 DIAGNOSIS — Z79899 Other long term (current) drug therapy: Secondary | ICD-10-CM | POA: Diagnosis not present

## 2022-12-20 DIAGNOSIS — Z170 Estrogen receptor positive status [ER+]: Secondary | ICD-10-CM | POA: Diagnosis not present

## 2022-12-20 DIAGNOSIS — K5900 Constipation, unspecified: Secondary | ICD-10-CM | POA: Insufficient documentation

## 2022-12-20 DIAGNOSIS — N8110 Cystocele, unspecified: Secondary | ICD-10-CM

## 2022-12-20 LAB — CEA (ACCESS): CEA (CHCC): <1 ng/mL (ref 0.00–5.00)

## 2022-12-20 MED ORDER — MISOPROSTOL 200 MCG PO TABS: 400.0000 ug | ORAL_TABLET | Freq: Once | ORAL | 0 refills | Status: DC

## 2022-12-20 NOTE — Progress Notes (Signed)
GYNECOLOGIC ONCOLOGY NEW PATIENT CONSULTATION  Date of Service: 12/20/2022 Referring Provider: Azucena Fallen, MD   ASSESSMENT AND PLAN: Victoria Wallace is a 72 y.o. woman with a 3.5cm adnexal cyst with irregular borders.  We reviewed that the exact etiology of the pelvic mass is unclear, but could include a benign, borderline, or malignant process.  Images not available for personal review today.  Recommended repeat ultrasound at the hospital for further characterization.  If this confirms a complex cyst, the recommended treatment is surgical excision to make a definitive diagnosis.  Reviewed that her normal CA125 collected by her OB/GYN is overall reassuring.  Will obtain CEA today.  Because the mass is relatively small, we feel that a minimally invasive approach is feasible, using robotic assistance.    Additionally, in the setting of her tamoxifen use and cystic structure within the uterus as well as possible brown discharge concerning for spotting, recommend repeat endometrial biopsy.  Discussed, that the brown could be due to atrophic vaginal mucosa in the setting of pessary use.  Patient reports that she is unable to remove and replace the pessary herself.  She also required pretreatment with Cytotec for her prior endometrial biopsy.  We discussed deferring exam today with plan to do a pelvic exam and endometrial biopsy at the same time following her pelvic ultrasound.  She will plan to have the pessary removed by her OB/GYN's office prior to the ultrasound, which she feels most comfortable with.  Reviewed that I could replace at the time of our visit together.  Prescription for Cytotec sent to her pharmacy.  Pending ultrasound findings, we will discuss at her follow-up more definitive surgical plan.  But briefly reviewed that surgery could entail a robotic total hysterectomy, bilateral salpingo-oophorectomy.  Given her concern for longstanding prolapse with difficulty in managing her pessary,  also reviewed referral to urogynecology for evaluation and consideration of possible joint procedure if indicated.   RTC after ultrasound  A copy of this note was sent to the patient's referring provider.  Bernadene Bell, MD Gynecologic Oncology   Medical Decision Making I personally spent  TOTAL 45 minutes face-to-face and non-face-to-face in the care of this patient, which includes all pre, intra, and post visit time on the date of service.   ------------  CC: Adnexal cyst  HISTORY OF PRESENT ILLNESS:  Victoria Wallace is a 72 y.o. woman who is seen in consultation at the request of Azucena Fallen, MD for evaluation of adnexal cyst.  Patient reports a history of postmenopausal bleeding in November 2022 at which time an endometrial biopsy was noted to be atrophic.  Her endometrial stripe at that time was 5.3 mm.  She subsequently underwent an ultrasound on 05/30/2022 which noted a simple cyst of 2.2 cm.  Repeat ultrasound 11/2022 noted a cystic appearance of the endometrium measuring 0.8 x 0.8 mm and a possible echogenic area of the left lateral edge of the cavity measuring 5 x 3 mm, possible polyp.  The ultrasound also showed a right adnexal cyst measuring 3.5 cm with irregular borders.    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  She is currently taking tamoxifen.  Today, patient presents alone.  She reports 1 single episode of spotting prior to her November evaluation.  She denies any spotting since that time.  However she does note occasional brown tinge in her discharge since taking the tamoxifen.  She additionally uses a pessary for prolapse which she reports she has  been using since May 2012.  She reports that she presents to her OB/GYN twice a year for pessary removal and reinsertion.  He otherwise denies abdominal bloating, early satiety, significant weight loss, change in bowel or bladder habits.   TREATMENT HISTORY: Oncology History  Ductal carcinoma in  situ (DCIS) of right breast  09/15/2019 Initial Diagnosis   Right breast biopsy upper outer quadrant: DCIS with necrosis high-grade, upper outer posterior: DCIS involving a complex sclerosing lesion, ER 100%, PR 60 to 80%   09/14/2021 Cancer Staging   Staging form: Breast, AJCC 8th Edition - Clinical stage from 09/14/2021: Stage 0 (cTis (DCIS), cN0, cM0, ER+, PR+) - Signed by Gardenia Phlegm, NP on 02/18/2022 Stage prefix: Initial diagnosis Nuclear grade: G3   10/20/2021 Surgery   Right mastectomy: High-grade DCIS 2.2 cm, margins negative, ER 100%, PR 60-80%   10/20/2021 Cancer Staging   Staging form: Breast, AJCC 8th Edition - Pathologic stage from 10/20/2021: Stage 0 (pTis (DCIS), pN0, cM0, ER+, PR+) - Signed by Gardenia Phlegm, NP on 02/18/2022 Stage prefix: Initial diagnosis Nuclear grade: G3   11/2021 -  Anti-estrogen oral therapy   Tamoxifen daily     PAST MEDICAL HISTORY: Past Medical History:  Diagnosis Date   Abnormal dexamethasone suppression test 06/2014   recieved info from patient   Arthritis    Asymptomatic microscopic hematuria    monitored by PCP    Chronic constipation    Encounter for hepatitis C screening test for low risk patient 05/18/2017   Flu vaccine 08/13/2017   History of colonoscopy 2012   Hypertension    shingles vaccine 2011   recieved info from patient   Tetanus toxoid vaccination administered within the past year 05/18/2017   Uterine prolapse    Wears Pessirey    PAST SURGICAL HISTORY: Past Surgical History:  Procedure Laterality Date   BREAST BIOPSY Right    EYE SURGERY     Retinol laser surgery   LUMBAR LAMINECTOMY Bilateral 2002   MOUTH SURGERY     Dental and gum   TOTAL 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   Procedure: RIGHT TOTAL KNEE ARTHROPLASTY;  Surgeon: Paralee Cancel, MD;  Location: [facility];  Service: Orthopedics;  Laterality: Right;  70 mins   TOTAL MASTECTOMY Right 10/20/2021   Procedure: RIGHT TOTAL MASTECTOMY;   Surgeon: Rolm Bookbinder, MD;  Location: [facility];  Service: General;  Laterality: Right;    OB/GYN HISTORY: OB History  Gravida Para Term Preterm AB Living  0 0 0 0 0 0  SAB IAB Ectopic Multiple Live Births  0 0 0 0 0      Age at menarche: 62 Age at menopause: 23 Hx of HRT: vaginal estrogen cream Hx of STI: No Last pap: 07/12/17 wnl History of abnormal pap smears: no  SCREENING STUDIES:  Last mammogram: 2023 Last colonoscopy: 02/03/22  MEDICATIONS:  Current Outpatient Medications:    cholecalciferol (VITAMIN D3) 25 MCG (1000 UNIT) tablet, Take 1,000 Units by mouth daily., Disp: , Rfl:    estradiol (ESTRACE) 0.1 MG/GM vaginal cream, Place 1 Applicatorful vaginally 2 (two) times a week., Disp: , Rfl:    Estradiol 10 MCG TABS vaginal tablet, Place vaginally once a week., Disp: , Rfl:    hydrochlorothiazide (HYDRODIURIL) 25 MG tablet, Take 25 mg by mouth daily., Disp: , Rfl:    levothyroxine (SYNTHROID) 25 MCG tablet, Take 1 tablet (25 mcg total) by mouth daily before breakfast., Disp: , Rfl:    lisinopril (PRINIVIL,ZESTRIL)  40 MG tablet, Take 40 mg by mouth daily., Disp: , Rfl:    metoprolol succinate (TOPROL-XL) 100 MG 24 hr tablet, Take 100 mg by mouth daily., Disp: , Rfl: 0   misoprostol (CYTOTEC) 200 MCG tablet, Place 2 tablets (400 mcg total) vaginally once for 1 dose. Place tablets vaginally 4 hours prior to visit., Disp: 2 tablet, Rfl: 0   Multiple Vitamin (MULTIVITAMIN WITH MINERALS) TABS tablet, Take 1 tablet by mouth daily. Pt takes three times a week., Disp: , Rfl:    polyethylene glycol (MIRALAX / GLYCOLAX) 17 g packet, Take 17 g by mouth daily., Disp: , Rfl:    tamoxifen (NOLVADEX) 20 MG tablet, Take 1 tablet (20 mg total) by mouth daily., Disp: 90 tablet, Rfl: 3   clobetasol ointment (TEMOVATE) 0.05 %, APPLY THIN LAYER TOPICALLY TO THE AFFECTED AREA TWICE DAILY, Disp: , Rfl:   ALLERGIES: Allergies  Allergen Reactions   Tape     STICKY PART ON  HEART MONITOR    Codeine Rash   Penicillins Swelling and Rash    PT WAS ABOUT 72 YEARS OLD  Has patient had a PCN reaction causing immediate rash, facial/tongue/throat swelling, SOB or lightheadedness with hypotension: Yes Has patient had a PCN reaction causing severe rash involving mucus membranes or skin necrosis: Unknown Has patient had a PCN reaction that required hospitalization: No Has patient had a PCN reaction occurring within the last 10 years: No If all of the above answers are "NO", then may proceed with Cephalosporin use.     FAMILY HISTORY: Family History  Problem Relation Age of Onset   Breast cancer Maternal Aunt    Colon cancer Maternal Uncle    Uterine cancer Niece        multiple IVF   Ovarian cancer Neg Hx    Pancreatic cancer Neg Hx    Prostate cancer Neg Hx     SOCIAL HISTORY: Social History   Socioeconomic History   Marital status: Widowed    Spouse name: Not on file   Number of children: Not on file   Years of education: Not on file   Highest education level: Not on file  Occupational History   Not on file  Tobacco Use   Smoking status: Never   Smokeless tobacco: Never  Vaping Use   Vaping Use: Never used  Substance and Sexual Activity   Alcohol use: Yes    Alcohol/week: 1.0 standard drink of alcohol    Types: 1 Glasses of wine per week    Comment: rare   Drug use: No   Sexual activity: Not Currently  Other Topics Concern   Not on file  Social History Narrative   Not on file   Social Determinants of Health   Financial Resource Strain: Not on file  Food Insecurity: Not on file  Transportation Needs: Not on file  Physical Activity: Not on file  Stress: Not on file  Social Connections: Not on file  Intimate Partner Violence: Not on file    REVIEW OF SYSTEMS: New patient intake form was reviewed.  Complete 10-system review is negative except for the following: Occasional urinary frequency, joint pain, microscopic blood in urine,  occasional incontinence, vaginal spotting, muscle cramps, vaginal discharge, minor gait problem  PHYSICAL EXAM: BP (!) 129/56 (BP Location: Left Arm, Patient Position: Sitting)   Pulse 82   Temp 98.7 F (37.1 C) (Oral)   Resp 14   Ht 5' 9.61" (1.768 m)   Wt 202 lb 3.2  oz (91.7 kg)   SpO2 100%   BMI 29.34 kg/m  Constitutional: No acute distress. Neuro/Psych: Alert, oriented.  Head and Neck: Normocephalic, atraumatic. Neck symmetric without masses. Sclera anicteric.  Respiratory: Normal work of breathing. Clear to auscultation bilaterally. Cardiovascular: Regular rate and rhythm, no murmurs, rubs, or gallops. Abdomen: Normoactive bowel sounds. Soft, non-distended, non-tender to palpation. No masses or hepatosplenomegaly appreciated. No evidence of hernia. No palpable fluid wave.  Extremities: Grossly normal range of motion. Warm, well perfused. No edema bilaterally. Skin: No rashes or lesions. Lymphatic: No cervical, supraclavicular, or inguinal adenopathy. Genitourinary: deferred today   LABORATORY AND RADIOLOGIC DATA: Outside medical records were reviewed to synthesize the above history, along with the history and physical obtained during the visit.  Outside laboratory, pathology, and imaging reports were reviewed, with pertinent results below.  Outside images not available for personal review.  WBC  Date Value Ref Range Status  08/29/2017 9.0 4.0 - 10.5 K/uL Final   Hemoglobin  Date Value Ref Range Status  08/29/2017 9.2 (L) 12.0 - 15.0 g/dL Final   HCT  Date Value Ref Range Status  08/29/2017 26.8 (L) 36.0 - 46.0 % Final   Platelets  Date Value Ref Range Status  08/29/2017 185 150 - 400 K/uL Final   Creatinine, Ser  Date Value Ref Range Status  10/12/2021 0.69 0.44 - 1.00 mg/dL Final   CEA [facility])  Date Value Ref Range Status  12/20/2022 <1.00 0.00 - 5.00 ng/mL Final    Comment:    (NOTE) This test was performed using Beck[employer] Coulter's  paramagnetic chemiluminescent immunoassay. Values obtained from different assay methods cannot be used interchangeably. Please note that up to 8% of patients who smoke may see values 5.1-10.0 ng/ml and 1% of patients who smoke may see CEA levels >10.0 ng/ml. Performed at [facility], [address], [place], [place] [address]     Endometrial biopsy (11/28/21) Atrophic endometrium. No hyperplasia, carcinoma, or endometritis.  Ca125 11/22/22: 6.8 08/23/22: 7.1

## 2022-12-20 NOTE — Patient Instructions (Signed)
It was a pleasure to see you in clinic today. - We will send you to lab today for CEA bloodwork - We will get you scheduled for an ultrasound - Plan follow-up after ultrasound for pelvic exam and biopsy. Prescription sent to place 2 tablets (460mg) of misoprostil/cytotec vaginally approximately 4 hours prior to your visit. - I have also placed an referral to the Urogynecologist for your prolapse in case a join procedure is feasible. - Return visit planned for after ultrasound.   Thank you very much for allowing me to provide care for you today.  I appreciate your confidence in [facility]  If you have any questions about your visit today please call our office or send [facility] MyChart message and we will get back to you as soon as possible.

## 2022-12-29 ENCOUNTER — Ambulatory Visit (HOSPITAL_COMMUNITY)
Admission: RE | Admit: 2022-12-29 | Discharge: 2022-12-29 | Disposition: A | Discharge status: Home / Self Care | Payer: Medicare PPO | Source: Ambulatory Visit | Attending: Psychiatry | Admitting: Psychiatry

## 2022-12-29 ENCOUNTER — Ambulatory Visit (INDEPENDENT_AMBULATORY_CARE_PROVIDER_SITE_OTHER): Payer: Medicare PPO | Admitting: Obstetrics and Gynecology

## 2022-12-29 ENCOUNTER — Encounter: Payer: Self-pay | Admitting: Obstetrics and Gynecology

## 2022-12-29 VITALS — BP 144/76 | HR 83 | Ht 69.29 in | Wt 202.2 lb

## 2022-12-29 DIAGNOSIS — N3281 Overactive bladder: Secondary | ICD-10-CM | POA: Diagnosis not present

## 2022-12-29 DIAGNOSIS — N8110 Cystocele, unspecified: Secondary | ICD-10-CM | POA: Diagnosis not present

## 2022-12-29 DIAGNOSIS — N3941 Urge incontinence: Secondary | ICD-10-CM | POA: Diagnosis not present

## 2022-12-29 DIAGNOSIS — N83209 Unspecified ovarian cyst, unspecified side: Secondary | ICD-10-CM | POA: Diagnosis present

## 2022-12-29 DIAGNOSIS — N812 Incomplete uterovaginal prolapse: Secondary | ICD-10-CM

## 2022-12-29 DIAGNOSIS — N3945 Continuous leakage: Secondary | ICD-10-CM

## 2022-12-29 NOTE — Patient Instructions (Signed)
Accidental Bowel Leakage: Our goal is to achieve formed bowel movements daily or every-other-day without leakage.  You may need to try different combinations of the following options to find what works best for you.  Some management options include: Dietary changes (more leafy greens, vegetables and fruits; less processed foods) Fiber supplementation (Metamucil or something with psyllium as active ingredient)- build up to one tablespoon per day.  Over-the-counter imodium (tablets or liquid) to help solidify the stool and prevent leakage of stool.

## 2022-12-29 NOTE — Progress Notes (Signed)
[facility] New Patient Evaluation and Consultation  Referring Provider: Bernadene Bell, MD PCP: Michael Boston, MD Date of Service: 12/29/2022  SUBJECTIVE Chief Complaint: New Patient (Initial Visit)  History of Present Illness: Victoria Wallace is a 72 y.o. White or Caucasian female seen in consultation at the request of Dr. Ernestina Patches for evaluation of prolapse.    Review of records significant for: She has seen Dr Ernestina Patches for robotic total hysterectomy and BSO for a 3.5cm adnexal cyst with irregular borders. [place] repeated today (final read not completed). Has an endometrial biopsy scheduled for next week due to brown discharge. Has been using a pessary for prolapse.   Urinary Symptoms: Leaks urine with with movement to the bathroom and with urgency. Denies leakage with cough/ sneeze.  Leakage happens rarely, more urgency without leakage.   Day time voids 12.  Nocturia: 1 times per night to void. Voiding dysfunction: she empties her bladder well.  does not use a catheter to empty bladder.  When urinating, she feels she has no difficulties Drinks: 1 cup coffee in AM per day, about 30+ oz water, occasional soda.  Has cut back on an afternoon coffee  UTIs:  0  UTI's in the last year.   Denies history of blood in urine and kidney or bladder stones  Pelvic Organ Prolapse Symptoms:                  She Admits to a feeling of a bulge the vaginal area. It has been present for 10+ years.  She Denies seeing a bulge.  This bulge is bothersome. Has used a ring pessary for more than 10 years- cleaned twice a year by Dr Benjie Karvonen. She does not have it in place currently- removed for her EMB scheduled on monday Had some vaginal bleeding a year ago and had an ultrasound and endometrial biopsy  Bowel Symptom: Bowel movements: 1-2 time(s) per day Stool consistency: soft  Straining: no.  Splinting: no.  Incomplete evacuation: yes.  She Admits to accidental bowel leakage / fecal  incontinence  Occurs: smudges with activity  Consistency with leakage: soft  Bowel regimen: miralax daily Last colonoscopy: Date- 01/2022, Results- hemorrhoids, diverticulosis, small sigmoid polyp  Sexual Function Sexually active: no.   Pelvic Pain Denies pelvic pain   Past Medical History:  Past Medical History:  Diagnosis Date   Abnormal dexamethasone suppression test 06/2014   recieved info from patient   Arthritis    Asymptomatic microscopic hematuria    monitored by PCP    Chronic constipation    Ductal carcinoma in situ (DCIS) of right breast    Encounter for hepatitis C screening test for low risk patient 05/18/2017   Flu vaccine 08/13/2017   History of colonoscopy 2012   Hypertension    shingles vaccine 2011   recieved info from patient   Tetanus toxoid vaccination administered within the past year 05/18/2017   Uterine prolapse    Wears Pessirey     Past Surgical History:   Past Surgical History:  Procedure Laterality Date   BREAST BIOPSY Right    EYE SURGERY     Retinol laser surgery   LUMBAR LAMINECTOMY Bilateral 2002   MOUTH SURGERY     Dental and gum   TOTAL 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   Procedure: RIGHT TOTAL KNEE ARTHROPLASTY;  Surgeon: Paralee Cancel, MD;  Location: [facility];  Service: Orthopedics;  Laterality: Right;  70 mins   TOTAL MASTECTOMY Right 10/20/2021   Procedure:  RIGHT TOTAL MASTECTOMY;  Surgeon: Rolm Bookbinder, MD;  Location: [facility];  Service: General;  Laterality: Right;     Past OB/GYN History: OB History  Gravida Para Term Preterm AB Living  0 0 0 0 0 0  SAB IAB Ectopic Multiple Live Births  0 0 0 0 0   Menopausal: at age 34, has had intermittent bleeding in the last year Last pap smear was negative.  Any history of abnormal pap smears: no. On tamoxifen for DCIS of the right breast.   Medications: She has a current medication list which includes the following prescription(s): cholecalciferol,  clobetasol ointment, estradiol, estradiol, hydrochlorothiazide, levothyroxine, lisinopril, metoprolol succinate, misoprostol, multivitamin with minerals, polyethylene glycol, and tamoxifen.   Allergies: Patient is allergic to tape, codeine, and penicillins.   Social History:  Social History   Tobacco Use   Smoking status: Never   Smokeless tobacco: Never  Vaping Use   Vaping Use: Never used  Substance Use Topics   Alcohol use: Yes    Alcohol/week: 1.0 standard drink of alcohol    Types: 1 Glasses of wine per week    Comment: rare   Drug use: No    Relationship status: widowed She lives alone in an independent living apartment.   She is not employed. Regular exercise: Yes: water exercise, bike, volleyball History of abuse: No  Family History:   Family History  Problem Relation Age of Onset   Hypertension Father    Breast cancer Maternal Aunt    Colon cancer Maternal Uncle    Uterine cancer Niece        multiple IVF   Ovarian cancer Neg Hx    Pancreatic cancer Neg Hx    Prostate cancer Neg Hx      Review of Systems: Review of Systems  Constitutional:  Negative for fever, malaise/fatigue and weight loss.  Respiratory:  Positive for cough. Negative for shortness of breath and wheezing.   Cardiovascular:  Negative for chest pain, palpitations and leg swelling.  Gastrointestinal:  Negative for abdominal pain and blood in stool.  Genitourinary:  Negative for dysuria.  Musculoskeletal:  Positive for myalgias.  Skin:  Negative for rash.  Neurological:  Negative for dizziness and headaches.  Endo/Heme/Allergies:  Does not bruise/bleed easily.  Psychiatric/Behavioral:  Negative for depression. The patient is not nervous/anxious.      OBJECTIVE Physical Exam: Vitals:   12/29/22 1456  BP: (!) 144/76  Pulse: 83  Weight: 202 lb 3.2 oz (91.7 kg)  Height: 5' 9.29" (1.76 m)    Physical Exam Constitutional:      General: She is not in acute distress. Pulmonary:      Effort: Pulmonary effort is normal.  Abdominal:     General: There is no distension.     Palpations: Abdomen is soft.     Tenderness: There is no abdominal tenderness. There is no rebound.  Musculoskeletal:        General: No swelling. Normal range of motion.  Skin:    General: Skin is warm and dry.     Findings: No rash.  Neurological:     Mental Status: She is alert and oriented to person, place, and time.  Psychiatric:        Mood and Affect: Mood normal.        Behavior: Behavior normal.      GU / Detailed Urogynecologic Evaluation:  Pelvic Exam: Normal external female genitalia; Bartholin's and Skene's glands normal in appearance; urethral meatus normal  in appearance, no urethral masses or discharge.   CST: negative  Speculum exam reveals normal vaginal mucosa with atrophy. Cervix normal appearance. Uterus normal single, nontender. Adnexa no mass, fullness, tenderness.     Pelvic floor strength II/V  Pelvic floor musculature: Right levator non-tender, Right obturator non-tender, Left levator non-tender, Left obturator non-tender  POP-Q:   POP-Q  0                                            Aa   0                                           Ba  -5                                              C   3                                            Gh  4                                            Pb  7                                            tvl   -2                                            Ap  -2                                            Bp  -6                                              D      Rectal Exam:  Normal sphincter tone, no distal rectocele  Post-Void Residual (PVR) by Bladder Scan: In order to evaluate bladder emptying, we discussed obtaining a postvoid residual and she agreed to this procedure.  Procedure: The ultrasound unit was placed on the patient's abdomen in the suprapubic region after the patient had voided. A PVR of 6 ml was  obtained by bladder scan.  Laboratory Results: POC urine: negative   ASSESSMENT AND PLAN Ms. Chesterfield is a 72 y.o. with:  1. Prolapse of anterior vaginal wall   2. Uterovaginal prolapse, incomplete   3. Overactive bladder    Stage II anterior, Stage I posterior, Stage I apical prolapse -  Currently has a pessary in place and is interested in prolapse repair in conjunction with hysterectomy by Dr Ernestina Patches.  - We discussed two options for prolapse repair:  1) native tissue repair without mesh - Pros - safer, no mesh complications - Cons - not as strong as mesh repair, higher risk of recurrence  2) laparoscopic repair with mesh - Pros - stronger, better long-term success - Cons - risks of mesh implant (erosion into vagina or bladder, adhering to the rectum, pain) - these risks are lower than with a vaginal mesh but still exist - She prefers the stronger procedure so will plan for Robotic sacocolpopexy.  - She will return for simple CMG to evaluate for occult stress incontinence and possible sling at time of prolapse repair.   2. OAB - We discussed the symptoms of overactive bladder (OAB), which include urinary urgency, urinary frequency, nocturia, with or without urge incontinence.  While we do not know the exact etiology of OAB, several treatment options exist. We discussed management including behavioral therapy (decreasing bladder irritants, urge suppression strategies, timed voids, bladder retraining), physical therapy, medication.  - Does not feel it is too bothersome. Will expectantly manage  3. Accidental Bowel Leakage:  - Treatment options include anti-diarrhea medication (loperamide/ Imodium OTC or prescription lomotil), fiber supplements, physical therapy, and possible sacral neuromodulation or surgery.   - She will add in daily psyllium fiber supplement for stool bulking. Is possibly interested in pelvic PT but will wait until after surgery if needed  Return for simple CMG.  Request sent to surgery scheduler.   Jaquita Folds, MD

## 2023-01-01 ENCOUNTER — Other Ambulatory Visit: Payer: Self-pay

## 2023-01-01 ENCOUNTER — Inpatient Hospital Stay (HOSPITAL_BASED_OUTPATIENT_CLINIC_OR_DEPARTMENT_OTHER): Payer: Medicare PPO | Admitting: Psychiatry

## 2023-01-01 VITALS — BP 153/66 | HR 89 | Temp 98.5°F | Resp 14 | Wt 196.6 lb

## 2023-01-01 DIAGNOSIS — N83201 Unspecified ovarian cyst, right side: Secondary | ICD-10-CM

## 2023-01-01 DIAGNOSIS — R935 Abnormal findings on diagnostic imaging of other abdominal regions, including retroperitoneum: Secondary | ICD-10-CM | POA: Diagnosis not present

## 2023-01-01 DIAGNOSIS — N83209 Unspecified ovarian cyst, unspecified side: Secondary | ICD-10-CM

## 2023-01-01 DIAGNOSIS — R9389 Abnormal findings on diagnostic imaging of other specified body structures: Secondary | ICD-10-CM

## 2023-01-01 HISTORY — PX: ENDOMETRIAL BIOPSY: SHX622

## 2023-01-01 NOTE — Progress Notes (Signed)
Gynecologic Oncology Return Clinic Visit  Date of Service: 01/01/2023 Referring Provider: Azucena Fallen, MD   Assessment & Plan: Victoria Wallace is a 72 y.o. woman with a 2.5cm simple cyst of the right ovary and a thickened/heterogeneous endometrium.  Reviewed that pelvic ultrasound noted a simple appearing right ovarian cyst. Benign appearance. Based on this appearance, normal tumor markers, do not have concern for malignancy.  Ultrasound also redemonstrated her thickened/heterogenous endometrium. Endometrial biopsy obtained today. Will follow-up results.   Pt has been able to be seen by Dr. Wannetta Sender (Urogyn) regarding her prolapse. Options for treatment were discussed and pt desires robotic sacrocolpopexy. Discussed with pt that if all results are benign, she could either have joint procedure (I could perform hyst, BSO) or possibly all could be performed by Dr. Wannetta Sender. Will discuss further when we have endometrial biopsy results.   RTC 2 weeks.  Bernadene Bell, MD Gynecologic Oncology   Medical Decision Making I personally spent  TOTAL 25 minutes face-to-face and non-face-to-face in the care of this patient, which includes all pre, intra, and post visit time on the date of service.   ----------------------- Reason for Visit: Follow-up, endometrial biospy  Treatment History: Oncology History  Ductal carcinoma in situ (DCIS) of right breast  09/15/2019 Initial Diagnosis   Right breast biopsy upper outer quadrant: DCIS with necrosis high-grade, upper outer posterior: DCIS involving a complex sclerosing lesion, ER 100%, PR 60 to 80%   09/14/2021 Cancer Staging   Staging form: Breast, AJCC 8th Edition - Clinical stage from 09/14/2021: Stage 0 (cTis (DCIS), cN0, cM0, ER+, PR+) - Signed by Gardenia Phlegm, NP on 02/18/2022 Stage prefix: Initial diagnosis Nuclear grade: G3   10/20/2021 Surgery   Right mastectomy: High-grade DCIS 2.2 cm, margins negative, ER 100%, PR 60-80%    10/20/2021 Cancer Staging   Staging form: Breast, AJCC 8th Edition - Pathologic stage from 10/20/2021: Stage 0 (pTis (DCIS), pN0, cM0, ER+, PR+) - Signed by Gardenia Phlegm, NP on 02/18/2022 Stage prefix: Initial diagnosis Nuclear grade: G3   11/2021 -  Anti-estrogen oral therapy   Tamoxifen daily     Interval History: Pt doing well since her last visit. She was able to be seen by Dr. Wannetta Sender with Urogynecology. She had her pessary removed and she underwent a pelvic ultrasound.   Pt placed cytotec prior to visit in anticipation of endometrial biopsy.  Past Medical/Surgical History: Past Medical History:  Diagnosis Date   Abnormal dexamethasone suppression test 06/2014   recieved info from patient   Arthritis    Asymptomatic microscopic hematuria    monitored by PCP    Chronic constipation    Ductal carcinoma in situ (DCIS) of right breast    Encounter for hepatitis C screening test for low risk patient 05/18/2017   Flu vaccine 08/13/2017   History of colonoscopy 2012   Hypertension    shingles vaccine 2011   recieved info from patient   Tetanus toxoid vaccination administered within the past year 05/18/2017   Uterine prolapse    Wears Pessirey    Past Surgical History:  Procedure Laterality Date   BREAST BIOPSY Right    EYE SURGERY     Retinol laser surgery   LUMBAR LAMINECTOMY Bilateral 2002   MOUTH SURGERY     Dental and gum   TOTAL 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   Procedure: RIGHT TOTAL KNEE ARTHROPLASTY;  Surgeon: Paralee Cancel, MD;  Location: [facility];  Service: Orthopedics;  Laterality: Right;  70 mins  TOTAL MASTECTOMY Right 10/20/2021   Procedure: RIGHT TOTAL MASTECTOMY;  Surgeon: Rolm Bookbinder, MD;  Location: [place];  Service: General;  Laterality: Right;    Family History  Problem Relation Age of Onset   Hypertension Father    Breast cancer Maternal Aunt    Colon cancer Maternal Uncle    Uterine cancer Niece         multiple IVF   Ovarian cancer Neg Hx    Pancreatic cancer Neg Hx    Prostate cancer Neg Hx     Social History   Socioeconomic History   Marital status: Widowed    Spouse name: Not on file   Number of children: Not on file   Years of education: Not on file   Highest education level: Not on file  Occupational History   Not on file  Tobacco Use   Smoking status: Never   Smokeless tobacco: Never  Vaping Use   Vaping Use: Never used  Substance and Sexual Activity   Alcohol use: Yes    Alcohol/week: 1.0 standard drink of alcohol    Types: 1 Glasses of wine per week    Comment: rare   Drug use: No   Sexual activity: Not Currently  Other Topics Concern   Not on file  Social History Narrative   Not on file   Social Determinants of Health   Financial Resource Strain: Not on file  Food Insecurity: Not on file  Transportation Needs: Not on file  Physical Activity: Not on file  Stress: Not on file  Social Connections: Not on file    Current Medications:  Current Outpatient Medications:    cholecalciferol (VITAMIN D3) 25 MCG (1000 UNIT) tablet, Take 1,000 Units by mouth daily., Disp: , Rfl:    clobetasol ointment (TEMOVATE) 0.05 %, APPLY THIN LAYER TOPICALLY TO THE AFFECTED AREA TWICE DAILY, Disp: , Rfl:    estradiol (ESTRACE) 0.1 MG/GM vaginal cream, Place 1 Applicatorful vaginally 2 (two) times a week., Disp: , Rfl:    Estradiol 10 MCG TABS vaginal tablet, Place vaginally once a week., Disp: , Rfl:    hydrochlorothiazide (HYDRODIURIL) 25 MG tablet, Take 25 mg by mouth daily., Disp: , Rfl:    levothyroxine (SYNTHROID) 25 MCG tablet, Take 1 tablet (25 mcg total) by mouth daily before breakfast., Disp: , Rfl:    lisinopril (PRINIVIL,ZESTRIL) 40 MG tablet, Take 40 mg by mouth daily., Disp: , Rfl:    metoprolol succinate (TOPROL-XL) 100 MG 24 hr tablet, Take 100 mg by mouth daily., Disp: , Rfl: 0   misoprostol (CYTOTEC) 200 MCG tablet, Place 2 tablets (400 mcg total) vaginally  once for 1 dose. Place tablets vaginally 4 hours prior to visit., Disp: 2 tablet, Rfl: 0   Multiple Vitamin (MULTIVITAMIN WITH MINERALS) TABS tablet, Take 1 tablet by mouth daily. Pt takes three times a week., Disp: , Rfl:    polyethylene glycol (MIRALAX / GLYCOLAX) 17 g packet, Take 17 g by mouth daily., Disp: , Rfl:    tamoxifen (NOLVADEX) 20 MG tablet, Take 1 tablet (20 mg total) by mouth daily., Disp: 90 tablet, Rfl: 3  Review of Symptoms: Complete 10-system review is negative except as above in Interval History.  Physical Exam: BP (!) 153/66 (BP Location: Right Arm, Patient Position: Sitting)   Pulse 89   Temp 98.5 F (36.9 C) (Oral)   Resp 14   Wt 196 lb 9.6 oz (89.2 kg)   SpO2 99%   BMI 28.79 kg/m  General: Alert, oriented, no acute distress. HEENT: Normocephalic, atraumatic. Neck symmetric without masses. Sclera anicteric. Chest: Normal work of breathing.  Abdomen: Soft, nontender.   Extremities: Grossly normal range of motion.  Warm, well perfused.  No edema bilaterally. Skin: No rashes or lesions noted. GU: Normal appearing external genitalia without erythema, excoriation, or lesions.  Speculum exam reveals atrophic vaginal mucosa with apical and anterior vaginal prolapse. Normal cervix.  Bimanual exam reveals normal cervix, small uterus, no adnexal masses palpated. Rectovaginal exam negative. Exam chaperoned by Joylene John, NP  EMB Procedure: After appropriate verbal informed consent was obtained, a timeout was performed. A sterile speculum was placed in the vagina, and the area was cleaned with betadine x3. A single-tooth tenaculum was placed on the anterior lip of the cervix. The os finder was used to dilate the cervix. An endometrial biopsy pipelle was advanced carefully to the uterine fundus which sounded to 7cm. An adequate sample was obtained over 2 passes. The tenaculum was removed, and tenaculum sites were noted to be hemostatic. The speculum was removed from the  vagina. Pessary was replaced. The patient tolerated the procedure well.   UPT: not indicated   Laboratory & Radiologic Studies: US PELVIC COMPLETE WITH TRANSVAGINAL 12/29/2022  Narrative CLINICAL DATA:  Pelvic mass, ovarian cyst  EXAM: TRANSABDOMINAL AND TRANSVAGINAL ULTRASOUND OF PELVIS  TECHNIQUE: Both transabdominal and transvaginal ultrasound examinations of the pelvis were performed. Transabdominal technique was performed for global imaging of the pelvis including uterus, ovaries, adnexal regions, and pelvic cul-de-sac. It was necessary to proceed with endovaginal exam following the transabdominal exam to visualize the endometrium and adnexal structures.  COMPARISON:  None Available.  FINDINGS: Uterus  Measurements: 7.1 x 3.7 x 3.0 cm = volume: 40.9 mL. No fibroids or other mass visualized.  Endometrium  Thickness: 8 mm. The endometrium is heterogeneous, with small cystic areas and minimal fluid identified.  Right ovary  Measurements: 1.9 x 2.5 x 2.2 cm = volume: 5.4 mL. A simple appearing cyst replaces the majority of the right ovary measuring 1.9 x 2.5 x 2.2 cm. No internal septations or mural thickening/nodularity.  Left ovary  Not visualized.  Other findings  No abnormal free fluid.  IMPRESSION: 1. 2.5 cm right ovarian benign simple cyst. No follow-up imaging is recommended. Reference: Radiology 2019 Nov;293(2):359-371 2. Heterogeneous thickened endometrium in this postmenopausal patient. Gynecologic consultation is recommended. If symptoms of postmenopausal bleeding are present, or if further evaluation is desired, endometrial sampling could be considered. 3. Nonvisualization of the left ovary.   Electronically Signed By: Randa Ngo M.D. On: 12/31/2022 17:32

## 2023-01-01 NOTE — Patient Instructions (Signed)
It was a pleasure to see you in clinic today. - We will see what the biopsy shows - Return visit planned for 2 weeks to determine final surgical plan.  Thank you very much for allowing me to provide care for you today.  I appreciate your confidence in choosing our Gynecologic Oncology team at [facility].  If you have any questions about your visit today please call our office or send [place] a MyChart message and we will get back to you as soon as possible.

## 2023-01-03 LAB — SURGICAL PATHOLOGY

## 2023-01-03 NOTE — Progress Notes (Signed)
Verbal consent was obtained to perform simple CMG procedure:   Prolapse was reduced using her ring pessary. Urethra was prepped with betadine and a 70F catheter was placed and bladder was drained completely. The bladder was then backfilled with sterile water by gravity.  First sensation: 40 First Desire: 5 Strong Desire: 260 Capacity: 400 Cough stress test was positive with standing . Valsalva stress test was negative.  She was was allowed to void on her own.   Interpretation: CMG showed increased sensation, and normal cystometric capacity. Findings positive for stress incontinence, positive for detrusor overactivity.    Patient is interested in getting a sling potentially as she would prefer a permanent fix.

## 2023-01-04 ENCOUNTER — Encounter: Payer: Self-pay | Admitting: Obstetrics and Gynecology

## 2023-01-04 ENCOUNTER — Ambulatory Visit: Payer: Medicare PPO | Admitting: Obstetrics and Gynecology

## 2023-01-04 VITALS — BP 124/76 | HR 73

## 2023-01-04 DIAGNOSIS — N3281 Overactive bladder: Secondary | ICD-10-CM | POA: Diagnosis not present

## 2023-01-04 DIAGNOSIS — N812 Incomplete uterovaginal prolapse: Secondary | ICD-10-CM

## 2023-01-04 DIAGNOSIS — N393 Stress incontinence (female) (male): Secondary | ICD-10-CM | POA: Diagnosis not present

## 2023-01-04 NOTE — Patient Instructions (Signed)
Follow up with Dr. Wannetta Sender for surgical planning

## 2023-01-07 ENCOUNTER — Encounter: Payer: Self-pay | Admitting: Psychiatry

## 2023-01-15 ENCOUNTER — Inpatient Hospital Stay (HOSPITAL_BASED_OUTPATIENT_CLINIC_OR_DEPARTMENT_OTHER): Payer: Medicare PPO | Admitting: Psychiatry

## 2023-01-15 VITALS — BP 132/53 | HR 65 | Temp 98.1°F | Resp 16 | Ht 69.5 in | Wt 199.3 lb

## 2023-01-15 DIAGNOSIS — R9389 Abnormal findings on diagnostic imaging of other specified body structures: Secondary | ICD-10-CM | POA: Diagnosis not present

## 2023-01-15 DIAGNOSIS — N83201 Unspecified ovarian cyst, right side: Secondary | ICD-10-CM | POA: Diagnosis not present

## 2023-01-15 NOTE — Progress Notes (Unsigned)
Gynecologic Oncology Return Clinic Visit  Date of Service: 01/15/2023 Referring Provider: Azucena Fallen, MD   Assessment & Plan: Victoria Wallace is a 72 y.o. woman with a 2.5cm simple cyst of the right ovary and a thickened/heterogeneous endometrium.  **** D&c versus proceeding with hyst   RTC PRN.  Bernadene Bell, MD Gynecologic Oncology   Medical Decision Making I personally spent  TOTAL 25 minutes face-to-face and non-face-to-face in the care of this patient, which includes all pre, intra, and post visit time on the date of service.   ----------------------- Reason for Visit: Follow-up, treatment planning  Treatment History: Oncology History  Ductal carcinoma in situ (DCIS) of right breast  09/15/2019 Initial Diagnosis   Right breast biopsy upper outer quadrant: DCIS with necrosis high-grade, upper outer posterior: DCIS involving a complex sclerosing lesion, ER 100%, PR 60 to 80%   09/14/2021 Cancer Staging   Staging form: Breast, AJCC 8th Edition - Clinical stage from 09/14/2021: Stage 0 (cTis (DCIS), cN0, cM0, ER+, PR+) - Signed by Gardenia Phlegm, NP on 02/18/2022 Stage prefix: Initial diagnosis Nuclear grade: G3   10/20/2021 Surgery   Right mastectomy: High-grade DCIS 2.2 cm, margins negative, ER 100%, PR 60-80%   10/20/2021 Cancer Staging   Staging form: Breast, AJCC 8th Edition - Pathologic stage from 10/20/2021: Stage 0 (pTis (DCIS), pN0, cM0, ER+, PR+) - Signed by Gardenia Phlegm, NP on 02/18/2022 Stage prefix: Initial diagnosis Nuclear grade: G3   11/2021 -  Anti-estrogen oral therapy   Tamoxifen daily     Interval History: Pt reports that she is overall stable since her last visit for the biopsy. She reports that during the time she had her pessary out, she noted less fecal smudging and decreased urinary urgency. She otherwise reports that the biopsy was challenging but she is doing better since. She denies any ongoing vaginal bleeding.    Past Medical/Surgical History: Past Medical History:  Diagnosis Date   Abnormal dexamethasone suppression test 06/2014   recieved info from patient   Arthritis    Asymptomatic microscopic hematuria    monitored by PCP    Chronic constipation    Ductal carcinoma in situ (DCIS) of right breast    Encounter for hepatitis C screening test for low risk patient 05/18/2017   Flu vaccine 08/13/2017   History of colonoscopy 2012   Hypertension    shingles vaccine 2011   recieved info from patient   Tetanus toxoid vaccination administered within the past year 05/18/2017   Uterine prolapse    Wears Pessirey    Past Surgical History:  Procedure Laterality Date   BREAST BIOPSY Right    EYE SURGERY     Retinol laser surgery   LUMBAR LAMINECTOMY Bilateral 2002   MOUTH SURGERY     Dental and gum   TOTAL 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   Procedure: RIGHT TOTAL KNEE ARTHROPLASTY;  Surgeon: Paralee Cancel, MD;  Location: [facility];  Service: Orthopedics;  Laterality: Right;  70 mins   TOTAL MASTECTOMY Right 10/20/2021   Procedure: RIGHT TOTAL MASTECTOMY;  Surgeon: Rolm Bookbinder, MD;  Location: [place];  Service: General;  Laterality: Right;    Family History  Problem Relation Age of Onset   Hypertension Father    Breast cancer Maternal Aunt    Colon cancer Maternal Uncle    Uterine cancer Niece        multiple IVF   Ovarian cancer Neg Hx    Pancreatic cancer Neg Hx  Prostate cancer Neg Hx     Social History   Socioeconomic History   Marital status: Widowed    Spouse name: Not on file   Number of children: Not on file   Years of education: Not on file   Highest education level: Not on file  Occupational History   Not on file  Tobacco Use   Smoking status: Never   Smokeless tobacco: Never  Vaping Use   Vaping Use: Never used  Substance and Sexual Activity   Alcohol use: Yes    Alcohol/week: 1.0 standard drink of alcohol    Types: 1 Glasses of wine  per week    Comment: rare   Drug use: No   Sexual activity: Not Currently  Other Topics Concern   Not on file  Social History Narrative   Not on file   Social Determinants of Health   Financial Resource Strain: Not on file  Food Insecurity: Not on file  Transportation Needs: Not on file  Physical Activity: Not on file  Stress: Not on file  Social Connections: Not on file    Current Medications:  Current Outpatient Medications:    cholecalciferol (VITAMIN D3) 25 MCG (1000 UNIT) tablet, Take 1,000 Units by mouth daily., Disp: , Rfl:    clobetasol ointment (TEMOVATE) 0.05 %, APPLY THIN LAYER TOPICALLY TO THE AFFECTED AREA TWICE DAILY, Disp: , Rfl:    estradiol (ESTRACE) 0.1 MG/GM vaginal cream, Place 1 Applicatorful vaginally 2 (two) times a week., Disp: , Rfl:    Estradiol 10 MCG TABS vaginal tablet, Place vaginally once a week., Disp: , Rfl:    hydrochlorothiazide (HYDRODIURIL) 25 MG tablet, Take 25 mg by mouth daily., Disp: , Rfl:    levothyroxine (SYNTHROID) 25 MCG tablet, Take 1 tablet (25 mcg total) by mouth daily before breakfast., Disp: , Rfl:    lisinopril (PRINIVIL,ZESTRIL) 40 MG tablet, Take 40 mg by mouth daily., Disp: , Rfl:    metoprolol succinate (TOPROL-XL) 100 MG 24 hr tablet, Take 100 mg by mouth daily., Disp: , Rfl: 0   Multiple Vitamin (MULTIVITAMIN WITH MINERALS) TABS tablet, Take 1 tablet by mouth daily. Pt takes three times a week., Disp: , Rfl:    polyethylene glycol (MIRALAX / GLYCOLAX) 17 g packet, Take 17 g by mouth daily., Disp: , Rfl:    tamoxifen (NOLVADEX) 20 MG tablet, Take 1 tablet (20 mg total) by mouth daily., Disp: 90 tablet, Rfl: 3   misoprostol (CYTOTEC) 200 MCG tablet, Place 2 tablets (400 mcg total) vaginally once for 1 dose. Place tablets vaginally 4 hours prior to visit., Disp: 2 tablet, Rfl: 0  Review of Symptoms: Complete 10-system review is positive for: slight urinary frequency, arthritis, occasional urge incontinence.  Physical  Exam: BP (!) 132/53 (BP Location: Right Arm, Patient Position: Sitting)   Pulse 65   Temp 98.1 F (36.7 C) (Oral)   Resp 16   Ht 5' 9.5" (1.765 m)   Wt 199 lb 4.7 oz (90.4 kg)   SpO2 100%   BMI 29.01 kg/m  General: Alert, oriented, no acute distress. HEENT: Normocephalic, atraumatic. Neck symmetric without masses. Sclera anicteric. Chest: Normal work of breathing.    Laboratory & Radiologic Studies: Surgical pathology (01/01/23): A. ENDOMETRIAL BIOPSY:  - SCANT FRAGMENTS OF SUPERFICIAL, INACTIVE TO ATROPHIC TYPE ENDOMETRIUM  - BENIGN ENDOCERVICAL GLANDS  - BENIGN SQUAMOUS MUCOSA  - NEGATIVE FOR HYPERPLASIA OR MALIGNANCY

## 2023-01-15 NOTE — Patient Instructions (Signed)
It was a pleasure to see you in clinic today. - We discussed the pros/cons of a D&C prior to surgery.  - I will close the loop with Dr. Wannetta Sender to proceed with surgery. - Return visit if needed.  Thank you very much for allowing me to provide care for you today.  I appreciate your confidence in choosing our Gynecologic Oncology team at [facility].  If you have any questions about your visit today please call our office or send [place] a MyChart message and we will get back to you as soon as possible.

## 2023-01-17 ENCOUNTER — Encounter: Payer: Self-pay | Admitting: Psychiatry

## 2023-01-23 ENCOUNTER — Encounter (INDEPENDENT_AMBULATORY_CARE_PROVIDER_SITE_OTHER): Payer: Medicare PPO | Admitting: Ophthalmology

## 2023-01-23 ENCOUNTER — Encounter (INDEPENDENT_AMBULATORY_CARE_PROVIDER_SITE_OTHER): Payer: Self-pay

## 2023-01-29 ENCOUNTER — Ambulatory Visit: Payer: Medicare PPO | Admitting: Obstetrics and Gynecology

## 2023-01-29 ENCOUNTER — Encounter: Payer: Self-pay | Admitting: Obstetrics and Gynecology

## 2023-01-29 VITALS — BP 112/76 | HR 73

## 2023-01-29 DIAGNOSIS — N812 Incomplete uterovaginal prolapse: Secondary | ICD-10-CM

## 2023-01-29 DIAGNOSIS — N83209 Unspecified ovarian cyst, unspecified side: Secondary | ICD-10-CM

## 2023-01-29 DIAGNOSIS — N8110 Cystocele, unspecified: Secondary | ICD-10-CM

## 2023-01-29 NOTE — Progress Notes (Signed)
[facility] Return Visit  SUBJECTIVE  History of Present Illness: DACI ARNT is a 72 y.o. female seen in follow-up to finalize surgical plan for prolapse and incontinence.   CMG showed increased sensation, and normal cystometric capacity. Findings positive for stress incontinence, positive for detrusor overactivity.    Seen by GYN Oncology: Benign appearing cyst of ovary on ultrasound. Endometrial biopsy without hyperplasia or malignancy. Dr Ernestina Patches reviewed that endometrial biopsy sample was noted to be scant and superficial. Patient does not wish to proceed with D&C and instead wishes to proceed with hysterectomy without additional sampling.  Past Medical History: Patient  has a past medical history of Abnormal dexamethasone suppression test (06/2014), Arthritis, Asymptomatic microscopic hematuria, Chronic constipation, Ductal carcinoma in situ (DCIS) of right breast, Encounter for hepatitis C screening test for low risk patient (05/18/2017), Flu vaccine (08/13/2017), History of colonoscopy (2012), Hypertension, shingles vaccine (2011), Tetanus toxoid vaccination administered within the past year (05/18/2017), and Uterine prolapse.   Past Surgical History: She  has a past surgical history that includes Lumbar laminectomy (Bilateral, 2002); Mouth surgery; Eye surgery; Total knee arthroplasty (Right, 08/28/2017); Breast biopsy (Right); and Total mastectomy (Right, 10/20/2021).   Medications: She has a current medication list which includes the following prescription(s): cholecalciferol, clobetasol ointment, estradiol, estradiol, hydrochlorothiazide, levothyroxine, lisinopril, metoprolol succinate, multivitamin with minerals, polyethylene glycol, and tamoxifen.   Allergies: Patient is allergic to tape, codeine, and penicillins.   Social History: Patient  reports that she has never smoked. She has never used smokeless tobacco. She reports current alcohol use of about 1.0  standard drink of alcohol per week. She reports that she does not use drugs.      OBJECTIVE     Physical Exam: Vitals:   01/29/23 1024  BP: 112/76  Pulse: 73   Gen: No apparent distress, A&O x 3.  Detailed Urogynecologic Evaluation:  Deferred. Prior exam showed:  POP-Q   0                                            Aa   0                                           Ba   -5                                              C    3                                            Gh   4                                            Pb   7  tvl    -2                                            Ap   -2                                            Bp   -6                                              D      ASSESSMENT AND PLAN    Ms. Pupillo is a 72 y.o. with:  1. Prolapse of anterior vaginal wall   2. Uterovaginal prolapse, incomplete   3. Cyst of ovary, unspecified laterality     Plan for surgery: Exam under anesthesia, total laparoscopic hysterectomy, bilateral salpingo-oophorectomy, sacrocolpopexy, midurethral sling, cystoscopy  - We reviewed the patient's specific anatomic and functional findings, with the assistance of diagrams, and together finalized the above procedure. The planned surgical procedures were discussed along with the surgical risks outlined below, which were also provided on a detailed handout. Additional treatment options including expectant management, conservative management, medical management were discussed where appropriate.  We reviewed the benefits and risks of each treatment option.   General Surgical Risks: For all procedures, there are risks of bleeding, infection, damage to surrounding organs including but not limited to bowel, bladder, blood vessels, ureters and nerves, and need for further surgery if an injury were to occur. These risks are all low with minimally invasive surgery.   There are risks of numbness  and weakness at any body site or buttock/rectal pain.  It is possible that baseline pain can be worsened by surgery, either with or without mesh. If surgery is vaginal, there is also a low risk of possible conversion to laparoscopy or open abdominal incision where indicated. Very rare risks include blood transfusion, blood clot, heart attack, pneumonia, or death.   There is also a risk of short-term postoperative urinary retention with need to use a catheter. About half of patients need to go home from surgery with a catheter, which is then later removed in the office. The risk of long-term need for a catheter is very low. There is also a risk of worsening of overactive bladder.   Sling: The effectiveness of a midurethral vaginal mesh sling is approximately 85%, and thus, there will be times when you may leak urine after surgery, especially if your bladder is full or if you have a strong cough. There is a balance between making the sling tight enough to treat your leakage but not too tight so that you have long-term difficulty emptying your bladder. A mesh sling will not directly treat overactive bladder/urge incontinence and may worsen it.  There is an FDA safety notification on vaginal mesh procedures for prolapse but NOT mesh slings. We have extensive experience and training with mesh placement and we have close postoperative follow up to identify any potential complications from mesh. It is important to realize that this mesh is a permanent implant that cannot be easily removed. There are rare risks of mesh exposure (2-4%), pain with intercourse (0-7%), and  infection (<1%). The risk of mesh exposure if more likely in a woman with risks for poor healing (prior radiation, poorly controlled diabetes, or immunocompromised). The risk of new or worsened chronic pain after mesh implant is more common in women with baseline chronic pain and/or poorly controlled anxiety or depression. Approximately 2-4% of patients  will experience longer-term post-operative voiding dysfunction that may require surgical revision of the sling. We also reviewed that postoperatively, her stream may not be as strong as before surgery.   Prolapse (with or without mesh): Risk factors for surgical failure  include things that put pressure on your pelvis and the surgical repair, including obesity, chronic cough, and heavy lifting or straining (including lifting children or adults, straining on the toilet, or lifting heavy objects such as furniture or anything weighing >25 lbs. Risks of recurrence is 20-30% with vaginal native tissue repair and a less than 10% with sacrocolpopexy with mesh.    Sacrocolpopexy: Mesh implants may provide more prolapse support, but do have some unique risks to consider. It is important to understand that mesh is permanent and cannot be easily removed. Risks of abdominal sacrocolpopexy mesh include mesh exposure (~3-6%), painful intercourse (recent studies show lower rates after surgery compared to before, with ~5-8% risk of new onset), and very rare risks of bowel or bladder injury or infection (<1%). The risk of mesh exposure is more likely in a woman with risks for poor healing (prior radiation, poorly controlled diabetes, or immunocompromised). The risk of new or worsened chronic pain after mesh implant is more common in women with baseline chronic pain and/or poorly controlled anxiety or depression. There is an FDA safety notification on vaginal mesh procedures for prolapse but NOT abdominal mesh procedures and therefore does not apply to your surgery. We have extensive experience and training with mesh placement and we have close postoperative follow up to identify any potential complications from mesh.    - For preop Visit:  She is required to have a visit within 30 days of her surgery.    - Medical clearance: not required  - Anticoagulant use: No - Medicaid Hysterectomy form: No - Accepts blood  transfusion: Yes - Expected length of stay: outpatient   Jaquita Folds, MD

## 2023-02-05 ENCOUNTER — Encounter: Payer: Self-pay | Admitting: *Deleted

## 2023-03-02 ENCOUNTER — Ambulatory Visit (INDEPENDENT_AMBULATORY_CARE_PROVIDER_SITE_OTHER): Payer: Medicare PPO | Admitting: Obstetrics and Gynecology

## 2023-03-02 ENCOUNTER — Encounter: Payer: Self-pay | Admitting: Obstetrics and Gynecology

## 2023-03-02 VITALS — BP 104/69 | HR 66

## 2023-03-02 DIAGNOSIS — N812 Incomplete uterovaginal prolapse: Secondary | ICD-10-CM | POA: Diagnosis not present

## 2023-03-02 DIAGNOSIS — Z01818 Encounter for other preprocedural examination: Secondary | ICD-10-CM

## 2023-03-02 MED ORDER — IBUPROFEN 600 MG PO TABS: 600.0000 mg | ORAL_TABLET | Freq: Four times a day (QID) | ORAL | 0 refills | Status: DC | PRN

## 2023-03-02 MED ORDER — POLYETHYLENE GLYCOL 3350 17 GM/SCOOP PO POWD
17.0000 g | Freq: Every day | ORAL | 0 refills | Status: AC
Start: 2023-03-02 — End: ?

## 2023-03-02 MED ORDER — OXYCODONE HCL 5 MG PO TABS: 5.0000 mg | ORAL_TABLET | ORAL | 0 refills | Status: DC | PRN

## 2023-03-02 MED ORDER — ACETAMINOPHEN 500 MG PO TABS: 500.0000 mg | ORAL_TABLET | Freq: Four times a day (QID) | ORAL | 0 refills | Status: DC | PRN

## 2023-03-02 NOTE — H&P (Addendum)
[facility] H&P  Subjective Chief Complaint: Victoria Wallace presents for a preoperative encounter.   History of Present Illness: Victoria Wallace is a 72 y.o. female who presents for preoperative visit.  She is scheduled to undergo Exam under anesthesia, Robotic Assisted Total Hysterectomy with Bilateral Salpingo-oophorectomy and Sacrocolpopexy, Mid-urethral sling, and Cystoscopy.  on 03/23/2023.  Her symptoms include pelvic organ prolapse , and she was was found to have Stage II anterior, Stage I posterior, Stage I apical prolapse.   Simple CMG showed: CMG showed increased sensation, and normal cystometric capacity. Findings positive for stress incontinence, positive for detrusor overactivity.   Past Medical History:  Diagnosis Date   Abnormal dexamethasone suppression test 06/2014   recieved info from patient   Arthritis    Asymptomatic microscopic hematuria    monitored by PCP    Chronic constipation    Ductal carcinoma in situ (DCIS) of right breast    Encounter for hepatitis C screening test for low risk patient 05/18/2017   Flu vaccine 08/13/2017   History of colonoscopy 2012   Hypertension    shingles vaccine 2011   recieved info from patient   Tetanus toxoid vaccination administered within the past year 05/18/2017   Uterine prolapse    Wears Pessirey     Past Surgical History:  Procedure Laterality Date   BREAST BIOPSY Right    EYE SURGERY     Retinol laser surgery   LUMBAR LAMINECTOMY Bilateral 2002   MOUTH SURGERY     Dental and gum   TOTAL 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   Procedure: RIGHT TOTAL KNEE ARTHROPLASTY;  Surgeon: Paralee Cancel, MD;  Location: [facility];  Service: Orthopedics;  Laterality: Right;  70 mins   TOTAL MASTECTOMY Right 10/20/2021   Procedure: RIGHT TOTAL MASTECTOMY;  Surgeon: Rolm Bookbinder, MD;  Location: [facility];  Service: General;  Laterality: Right;    is allergic to tape, codeine, and penicillins.    Family History  Problem Relation Age of Onset   Hypertension Father    Breast cancer Maternal Aunt    Colon cancer Maternal Uncle    Uterine cancer Niece        multiple IVF   Ovarian cancer Neg Hx    Pancreatic cancer Neg Hx    Prostate cancer Neg Hx     Social History   Tobacco Use   Smoking status: Never   Smokeless tobacco: Never  Vaping Use   Vaping Use: Never used  Substance Use Topics   Alcohol use: Yes    Alcohol/week: 1.0 standard drink of alcohol    Types: 1 Glasses of wine per week    Comment: rare   Drug use: No     Review of Systems was negative for a full 10 system review except as noted in the History of Present Illness.  No current facility-administered medications for this encounter.  Current Outpatient Medications:    acetaminophen (TYLENOL) 500 MG tablet, Take 1 tablet (500 mg total) by mouth every 6 (six) hours as needed (pain)., Disp: 30 tablet, Rfl: 0   cholecalciferol (VITAMIN D3) 25 MCG (1000 UNIT) tablet, Take 1,000 Units by mouth daily., Disp: , Rfl:    clobetasol ointment (TEMOVATE) 0.05 %, APPLY THIN LAYER TOPICALLY TO THE AFFECTED AREA TWICE DAILY, Disp: , Rfl:    estradiol (ESTRACE) 0.1 MG/GM vaginal cream, Place 1 Applicatorful vaginally 2 (two) times a week., Disp: , Rfl:    Estradiol 10 MCG TABS vaginal tablet,  Place vaginally once a week., Disp: , Rfl:    hydrochlorothiazide (HYDRODIURIL) 25 MG tablet, Take 25 mg by mouth daily., Disp: , Rfl:    ibuprofen (ADVIL) 600 MG tablet, Take 1 tablet (600 mg total) by mouth every 6 (six) hours as needed., Disp: 30 tablet, Rfl: 0   levothyroxine (SYNTHROID) 25 MCG tablet, Take 1 tablet (25 mcg total) by mouth daily before breakfast., Disp: , Rfl:    lisinopril (PRINIVIL,ZESTRIL) 40 MG tablet, Take 40 mg by mouth daily., Disp: , Rfl:    metoprolol succinate (TOPROL-XL) 100 MG 24 hr tablet, Take 100 mg by mouth daily., Disp: , Rfl: 0   Multiple Vitamin (MULTIVITAMIN WITH MINERALS) TABS tablet, Take  1 tablet by mouth daily. Pt takes three times a week., Disp: , Rfl:    oxyCODONE (OXY IR/ROXICODONE) 5 MG immediate release tablet, Take 1 tablet (5 mg total) by mouth every 4 (four) hours as needed for severe pain., Disp: 15 tablet, Rfl: 0   polyethylene glycol powder (GLYCOLAX/MIRALAX) 17 GM/SCOOP powder, Take 17 g by mouth daily. Drink 17g (1 scoop) dissolved in water per day., Disp: 255 g, Rfl: 0   tamoxifen (NOLVADEX) 20 MG tablet, Take 1 tablet (20 mg total) by mouth daily., Disp: 90 tablet, Rfl: 3   Objective   Gen: NAD CV: S1 S2 RRR Lungs: Clear to auscultation bilaterally Abd: soft, nontender   Previous Pelvic Exam showed: Normal external female genitalia; Bartholin's and Skene's glands normal in appearance; urethral meatus normal in appearance, no urethral masses or discharge.    CST: negative   Speculum exam reveals normal vaginal mucosa with atrophy. Cervix normal appearance. Uterus normal single, nontender. Adnexa no mass, fullness, tenderness.       Pelvic floor strength II/V   Pelvic floor musculature: Right levator non-tender, Right obturator non-tender, Left levator non-tender, Left obturator non-tender   POP-Q:    POP-Q   0                                            Aa   0                                           Ba   -5                                              C    3                                            Gh   4                                            Pb   7  tvl    -2                                            Ap   -2                                            Bp   -6                                              D          Rectal Exam:  Normal sphincter tone, no distal rectocele    Assessment/ Plan  Assessment: The patient is a 72 y.o. year old scheduled to undergo Exam under anesthesia, Robotic Assisted Total Hysterectomy with Bilateral Salpingo-oophorectomy and Sacrocolpopexy,  Mid-urethral sling, and Cystoscopy. Verbal consent was obtained for these procedures.

## 2023-03-02 NOTE — Progress Notes (Signed)
[facility] Pre-Operative Exam  Subjective Chief Complaint: Victoria Wallace presents for a preoperative encounter.   History of Present Illness: Victoria Wallace is a 72 y.o. female who presents for preoperative visit.  She is scheduled to undergo Exam under anesthesia, Robotic Assisted Total Hysterectomy with Bilateral Salpingo-oophorectomy and Sacrocolpopexy, Possible Mid-urethral sling, and Cystoscopy.  on 03/23/2023.  Her symptoms include pelvic organ prolapse , and she was was found to have Stage II anterior, Stage I posterior, Stage I apical prolapse.   Simple CMG showed: CMG showed increased sensation, and normal cystometric capacity. Findings positive for stress incontinence, positive for detrusor overactivity.   Past Medical History:  Diagnosis Date   Abnormal dexamethasone suppression test 06/2014   recieved info from patient   Arthritis    Asymptomatic microscopic hematuria    monitored by PCP    Chronic constipation    Ductal carcinoma in situ (DCIS) of right breast    Encounter for hepatitis C screening test for low risk patient 05/18/2017   Flu vaccine 08/13/2017   History of colonoscopy 2012   Hypertension    shingles vaccine 2011   recieved info from patient   Tetanus toxoid vaccination administered within the past year 05/18/2017   Uterine prolapse    Wears Pessirey     Past Surgical History:  Procedure Laterality Date   BREAST BIOPSY Right    EYE SURGERY     Retinol laser surgery   LUMBAR LAMINECTOMY Bilateral 2002   MOUTH SURGERY     Dental and gum   TOTAL 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   Procedure: RIGHT TOTAL KNEE ARTHROPLASTY;  Surgeon: Paralee Cancel, MD;  Location: [facility];  Service: Orthopedics;  Laterality: Right;  70 mins   TOTAL MASTECTOMY Right 10/20/2021   Procedure: RIGHT TOTAL MASTECTOMY;  Surgeon: Rolm Bookbinder, MD;  Location: [facility];  Service: General;  Laterality: Right;    is allergic to tape,  codeine, and penicillins.   Family History  Problem Relation Age of Onset   Hypertension Father    Breast cancer Maternal Aunt    Colon cancer Maternal Uncle    Uterine cancer Niece        multiple IVF   Ovarian cancer Neg Hx    Pancreatic cancer Neg Hx    Prostate cancer Neg Hx     Social History   Tobacco Use   Smoking status: Never   Smokeless tobacco: Never  Vaping Use   Vaping Use: Never used  Substance Use Topics   Alcohol use: Yes    Alcohol/week: 1.0 standard drink of alcohol    Types: 1 Glasses of wine per week    Comment: rare   Drug use: No     Review of Systems was negative for a full 10 system review except as noted in the History of Present Illness.   Current Outpatient Medications:    cholecalciferol (VITAMIN D3) 25 MCG (1000 UNIT) tablet, Take 1,000 Units by mouth daily., Disp: , Rfl:    clobetasol ointment (TEMOVATE) 0.05 %, APPLY THIN LAYER TOPICALLY TO THE AFFECTED AREA TWICE DAILY, Disp: , Rfl:    estradiol (ESTRACE) 0.1 MG/GM vaginal cream, Place 1 Applicatorful vaginally 2 (two) times a week., Disp: , Rfl:    Estradiol 10 MCG TABS vaginal tablet, Place vaginally once a week., Disp: , Rfl:    hydrochlorothiazide (HYDRODIURIL) 25 MG tablet, Take 25 mg by mouth daily., Disp: , Rfl:    levothyroxine (SYNTHROID) 25 MCG  tablet, Take 1 tablet (25 mcg total) by mouth daily before breakfast., Disp: , Rfl:    lisinopril (PRINIVIL,ZESTRIL) 40 MG tablet, Take 40 mg by mouth daily., Disp: , Rfl:    metoprolol succinate (TOPROL-XL) 100 MG 24 hr tablet, Take 100 mg by mouth daily., Disp: , Rfl: 0   Multiple Vitamin (MULTIVITAMIN WITH MINERALS) TABS tablet, Take 1 tablet by mouth daily. Pt takes three times a week., Disp: , Rfl:    tamoxifen (NOLVADEX) 20 MG tablet, Take 1 tablet (20 mg total) by mouth daily., Disp: 90 tablet, Rfl: 3   Objective Vitals:   03/02/23 0958  BP: 104/69  Pulse: 66    Gen: NAD CV: S1 S2 RRR Lungs: Clear to auscultation  bilaterally Abd: soft, nontender   Previous Pelvic Exam showed: Normal external female genitalia; Bartholin's and Skene's glands normal in appearance; urethral meatus normal in appearance, no urethral masses or discharge.    CST: negative   Speculum exam reveals normal vaginal mucosa with atrophy. Cervix normal appearance. Uterus normal single, nontender. Adnexa no mass, fullness, tenderness.       Pelvic floor strength II/V   Pelvic floor musculature: Right levator non-tender, Right obturator non-tender, Left levator non-tender, Left obturator non-tender   POP-Q:    POP-Q   0                                            Aa   0                                           Ba   -5                                              C    3                                            Gh   4                                            Pb   7                                            tvl    -2                                            Ap   -2                                            Bp   -  6                                              D          Rectal Exam:  Normal sphincter tone, no distal rectocele    Assessment/ Plan  Assessment: The patient is a 72 y.o. year old scheduled to undergo Exam under anesthesia, Robotic Assisted Total Hysterectomy with Bilateral Salpingo-oophorectomy and Sacrocolpopexy, Possible Mid-urethral sling, and Cystoscopy. Verbal consent was obtained for these procedures.  Plan: General Surgical Consent: The patient has previously been counseled on alternative treatments, and the decision by the patient and provider was to proceed with the procedure listed above.  For all procedures, there are risks of bleeding, infection, damage to surrounding organs including but not limited to bowel, bladder, blood vessels, ureters and nerves, and need for further surgery if an injury were to occur. These risks are all low with minimally invasive surgery.   There  are risks of numbness and weakness at any body site or buttock/rectal pain.  It is possible that baseline pain can be worsened by surgery, either with or without mesh. If surgery is vaginal, there is also a low risk of possible conversion to laparoscopy or open abdominal incision where indicated. Very rare risks include blood transfusion, blood clot, heart attack, pneumonia, or death.   There is also a risk of short-term postoperative urinary retention with need to use a catheter. About half of patients need to go home from surgery with a catheter, which is then later removed in the office. The risk of long-term need for a catheter is very low. There is also a risk of worsening of overactive bladder.   Sling: The effectiveness of a midurethral vaginal mesh sling is approximately 85%, and thus, there will be times when you may leak urine after surgery, especially if your bladder is full or if you have a strong cough. There is a balance between making the sling tight enough to treat your leakage but not too tight so that you have long-term difficulty emptying your bladder. A mesh sling will not directly treat overactive bladder/urge incontinence and may worsen it.  There is an FDA safety notification on vaginal mesh procedures for prolapse but NOT mesh slings. We have extensive experience and training with mesh placement and we have close postoperative follow up to identify any potential complications from mesh. It is important to realize that this mesh is a permanent implant that cannot be easily removed. There are rare risks of mesh exposure (2-4%), pain with intercourse (0-7%), and infection (<1%). The risk of mesh exposure if more likely in a woman with risks for poor healing (prior radiation, poorly controlled diabetes, or immunocompromised). The risk of new or worsened chronic pain after mesh implant is more common in women with baseline chronic pain and/or poorly controlled anxiety or depression.  Approximately 2-4% of patients will experience longer-term post-operative voiding dysfunction that may require surgical revision of the sling. We also reviewed that postoperatively, her stream may not be as strong as before surgery.   Prolapse (with or without mesh): Risk factors for surgical failure  include things that put pressure on your pelvis and the surgical repair, including obesity, chronic cough, and heavy lifting or straining (including lifting children or adults, straining on the toilet, or lifting heavy objects such as furniture or anything weighing >  25 lbs. Risks of recurrence is 20-30% with vaginal native tissue repair and a less than 10% with sacrocolpopexy with mesh.    Sacrocolpopexy: Mesh implants may provide more prolapse support, but do have some unique risks to consider. It is important to understand that mesh is permanent and cannot be easily removed. Risks of abdominal sacrocolpopexy mesh include mesh exposure (~3-6%), painful intercourse (recent studies show lower rates after surgery compared to before, with ~5-8% risk of new onset), and very rare risks of bowel or bladder injury or infection (<1%). The risk of mesh exposure is more likely in a woman with risks for poor healing (prior radiation, poorly controlled diabetes, or immunocompromised). The risk of new or worsened chronic pain after mesh implant is more common in women with baseline chronic pain and/or poorly controlled anxiety or depression. There is an FDA safety notification on vaginal mesh procedures for prolapse but NOT abdominal mesh procedures and therefore does not apply to your surgery. We have extensive experience and training with mesh placement and we have close postoperative follow up to identify any potential complications from mesh.    We discussed consent for blood products. Risks for blood transfusion include allergic reactions, other reactions that can affect different body organs and managed accordingly,  transmission of infectious diseases such as HIV or Hepatitis. However, the blood is screened. Patient consents for blood products.  Pre-operative instructions:  She was instructed to not take Aspirin/NSAIDs x 7days prior to surgery. Antibiotic prophylaxis was ordered as indicated.  Catheter use: Patient will go home with foley if needed after post-operative voiding trial.  Post-operative instructions:  She was provided with specific post-operative instructions, including precautions and signs/symptoms for which we would recommend contacting us, in addition to daytime and after-hours contact phone numbers. This was provided on a handout.   Post-operative medications: Prescriptions for motrin, tylenol, miralax, and oxycodone were sent to her pharmacy. Discussed using ibuprofen and tylenol on a schedule to limit use of narcotics.   Laboratory testing:  We will check labs: CBC and Type and Screen  Preoperative clearance:  She does not require surgical clearance.    Post-operative follow-up:  A post-operative appointment will be made for 6 weeks from the date of surgery. If she needs a post-operative nurse visit for a voiding trial, that will be set up after she leaves the hospital.    Patient will call the clinic or use MyChart should anything change or any new issues arise.   Berton Mount, NP

## 2023-03-02 NOTE — Patient Instructions (Signed)
Your medications have been sent to the pharmacy for you to pick up at your leisure.

## 2023-03-20 ENCOUNTER — Encounter (HOSPITAL_BASED_OUTPATIENT_CLINIC_OR_DEPARTMENT_OTHER): Payer: Self-pay | Admitting: Obstetrics and Gynecology

## 2023-03-20 ENCOUNTER — Other Ambulatory Visit: Payer: Self-pay

## 2023-03-20 NOTE — Progress Notes (Signed)
Your procedure is scheduled on Friday, 03/23/2023.  Report to [facility] AT  9:00 AM.   Call this number if you have problems the morning of surgery  [phone].   OUR ADDRESS IS [address].  WE ARE LOCATED IN [facility].  PLEASE BRING YOUR INSURANCE CARD AND PHOTO ID DAY OF SURGERY.  ONLY 2 PEOPLE ARE ALLOWED IN  WAITING  ROOM / CURRENTLY NO ONE UNDER AGE 73                                     REMEMBER:  DO NOT EAT FOOD, CANDY GUM OR MINTS  AFTER MIDNIGHT THE NIGHT BEFORE YOUR SURGERY . YOU MAY HAVE CLEAR LIQUIDS FROM MIDNIGHT THE NIGHT BEFORE YOUR SURGERY UNTIL 8:00 AM. NO CLEAR LIQUIDS AFTER   8:00 AM DAY OF SURGERY.  YOU MAY  BRUSH YOUR TEETH MORNING OF SURGERY AND RINSE YOUR MOUTH OUT, NO CHEWING GUM CANDY OR MINTS.     CLEAR LIQUID DIET    Allowed      Water                                                                   Coffee and tea, regular and decaf  (NO cream or milk products of any type, may sweeten)                         Carbonated beverages, regular and diet                                    Sports drinks like Gatorade _____________________________________________________________________     TAKE ONLY THESE MEDICATIONS MORNING OF SURGERY:  Synthroid, Metoprolol, Tamoxifen    UP TO 4 VISITORS  MAY VISIT IN THE EXTENDED RECOVERY ROOM UNTIL 800 PM ONLY.  ONE  VISITOR AGE 53 AND OVER MAY SPEND THE NIGHT AND MUST BE IN EXTENDED RECOVERY ROOM NO LATER THAN 800 PM . YOUR DISCHARGE TIME AFTER YOU SPEND THE NIGHT IS 900 AM THE MORNING AFTER YOUR SURGERY.  YOU MAY PACK A SMALL OVERNIGHT BAG WITH TOILETRIES FOR YOUR OVERNIGHT STAY IF YOU WISH.  YOUR PRESCRIPTION MEDICATIONS WILL BE PROVIDED DURING [facility].                                      DO NOT WEAR JEWERLY/  METAL/  PIERCINGS (INCLUDING NO PLASTIC PIERCINGS) DO NOT WEAR LOTIONS, POWDERS, PERFUMES OR NAIL POLISH ON YOUR FINGERNAILS. TOENAIL POLISH  IS OK TO WEAR. DO NOT SHAVE FOR 48 HOURS PRIOR TO DAY OF SURGERY.  CONTACTS, GLASSES, OR DENTURES MAY NOT BE WORN TO SURGERY.  REMEMBER: NO SMOKING, VAPING ,  DRUGS OR ALCOHOL FOR 24 HOURS BEFORE YOUR SURGERY.  [facility] IS NOT RESPONSIBLE  FOR ANY BELONGINGS.                                                                    Marland Kitchen           [facility] - Preparing for Surgery Before surgery, you can play an important role.  Because skin is not sterile, your skin needs to be as free of germs as possible.  You can reduce the number of germs on your skin by washing with CHG (chlorahexidine gluconate) soap before surgery.  CHG is an antiseptic cleaner which kills germs and bonds with the skin to continue killing germs even after washing. Please DO NOT use if you have an allergy to CHG or antibacterial soaps.  If your skin becomes reddened/irritated stop using the CHG and inform your nurse when you arrive at Short Stay. Do not shave (including legs and underarms) for at least 48 hours prior to the first CHG shower.  You may shave your face/neck. Please follow these instructions carefully:  1.  Shower with CHG Soap the night before surgery and the  morning of Surgery.  2.  If you choose to wash your hair, wash your hair first as usual with your  normal  shampoo.  3.  After you shampoo, rinse your hair and body thoroughly to remove the  shampoo.                                        4.  Use CHG as you would any other liquid soap.  You can apply chg directly  to the skin and wash , chg soap provided, night before and morning of your surgery.  5.  Apply the CHG Soap to your body ONLY FROM THE NECK DOWN.   Do not use on face/ open                           Wound or open sores. Avoid contact with eyes, ears mouth and genitals (private parts).                       Wash face,  Genitals (private parts) with your normal soap.             6.  Wash thoroughly, paying special  attention to the area where your surgery  will be performed.  7.  Thoroughly rinse your body with warm water from the neck down.  8.  DO NOT shower/wash with your normal soap after using and rinsing off  the CHG Soap.             9.  Pat yourself dry with a clean towel.            10.  Wear clean pajamas.            11.  Place clean sheets on your bed the night of your first shower and do not  sleep with pets. Day of Surgery : Do not apply any lotions/ powders the morning of surgery.  Please wear clean clothes to the hospital/surgery  center.  IF YOU HAVE ANY SKIN IRRITATION OR PROBLEMS WITH THE SURGICAL SOAP, PLEASE GET A BAR OF GOLD DIAL SOAP AND SHOWER THE NIGHT BEFORE YOUR SURGERY AND THE MORNING OF YOUR SURGERY. PLEASE LET THE NURSE KNOW MORNING OF YOUR SURGERY IF YOU HAD ANY PROBLEMS WITH THE SURGICAL SOAP.   ________________________________________________________________________                                                        QUESTIONS [facility] PRE OP NURSE PHONE [phone].

## 2023-03-20 NOTE — Progress Notes (Signed)
Spoke w/ via phone for pre-op interview---Victoria Wallace needs dos----  none             Wallace results------03/21/23 Wallace appt for cbc, type & screen , bmp, ekg COVID test -----patient states asymptomatic no test needed Arrive at -------0900 on Friday, 03/23/23 NPO after MN NO Solid Food.  Clear liquids from MN until---0800 Med rec completed Medications to take morning of surgery -----Synthroid, Metoprolol, Tamoxifen Diabetic medication -----n/a Patient instructed no nail polish to be worn day of surgery Patient instructed to bring photo id and insurance card day of surgery  Patient aware to have Driver (ride ) / caregiver    for 24 hours after surgery - friend, Victoria Wallace Patient Special Instructions -----Extended / Overnight stay instructions given. Pre-Op special Istructions -----none Patient verbalized understanding of instructions that were given at this phone interview. Patient denies shortness of breath, chest pain, fever, cough at this phone interview.

## 2023-03-21 ENCOUNTER — Encounter (HOSPITAL_COMMUNITY)
Admission: RE | Admit: 2023-03-21 | Discharge: 2023-03-21 | Disposition: A | Discharge status: Home / Self Care | Payer: Medicare PPO | Source: Ambulatory Visit | Attending: Obstetrics and Gynecology | Admitting: Obstetrics and Gynecology

## 2023-03-21 DIAGNOSIS — Z01818 Encounter for other preprocedural examination: Secondary | ICD-10-CM | POA: Insufficient documentation

## 2023-03-21 LAB — BASIC METABOLIC PANEL
Anion gap: 6 (ref 5–15)
BUN: 20 mg/dL (ref 8–23)
CO2: 27 mmol/L (ref 22–32)
Calcium: 9.2 mg/dL (ref 8.9–10.3)
Chloride: 105 mmol/L (ref 98–111)
Creatinine, Ser: 0.62 mg/dL (ref 0.44–1.00)
GFR, Estimated: >60 mL/min (ref >60)
Glucose, Bld: 100 mg/dL — ABNORMAL HIGH (ref 70–99)
Potassium: 3.9 mmol/L (ref 3.5–5.1)
Sodium: 138 mmol/L (ref 135–145)

## 2023-03-21 LAB — CBC
HCT: 35 % — ABNORMAL LOW (ref 36.0–46.0)
Hemoglobin: 11.3 g/dL — ABNORMAL LOW (ref 12.0–15.0)
MCH: 31.9 pg (ref 26.0–34.0)
MCHC: 32.3 g/dL (ref 30.0–36.0)
MCV: 98.9 fL (ref 80.0–100.0)
Platelets: 204 10*3/uL (ref 150–400)
RBC: 3.54 MIL/uL — ABNORMAL LOW (ref 3.87–5.11)
RDW: 13 % (ref 11.5–15.5)
WBC: 5 10*3/uL (ref 4.0–10.5)
nRBC: 0 % (ref 0.0–0.2)

## 2023-03-23 ENCOUNTER — Ambulatory Visit (HOSPITAL_BASED_OUTPATIENT_CLINIC_OR_DEPARTMENT_OTHER): Payer: Medicare PPO | Admitting: Certified Registered Nurse Anesthetist

## 2023-03-23 ENCOUNTER — Encounter (HOSPITAL_BASED_OUTPATIENT_CLINIC_OR_DEPARTMENT_OTHER): Payer: Self-pay | Admitting: Obstetrics and Gynecology

## 2023-03-23 ENCOUNTER — Other Ambulatory Visit: Payer: Self-pay

## 2023-03-23 ENCOUNTER — Ambulatory Visit (HOSPITAL_BASED_OUTPATIENT_CLINIC_OR_DEPARTMENT_OTHER)
Admission: RE | Admit: 2023-03-23 | Discharge: 2023-03-24 | Disposition: A | Discharge status: Home / Self Care | Payer: Medicare PPO | Attending: Obstetrics and Gynecology | Admitting: Obstetrics and Gynecology

## 2023-03-23 ENCOUNTER — Encounter (HOSPITAL_BASED_OUTPATIENT_CLINIC_OR_DEPARTMENT_OTHER)
Admission: RE | Disposition: A | Discharge status: Home / Self Care | Payer: Self-pay | Source: Home / Self Care | Attending: Obstetrics and Gynecology

## 2023-03-23 DIAGNOSIS — Z79899 Other long term (current) drug therapy: Secondary | ICD-10-CM | POA: Diagnosis not present

## 2023-03-23 DIAGNOSIS — N736 Female pelvic peritoneal adhesions (postinfective): Secondary | ICD-10-CM | POA: Insufficient documentation

## 2023-03-23 DIAGNOSIS — N838 Other noninflammatory disorders of ovary, fallopian tube and broad ligament: Secondary | ICD-10-CM | POA: Insufficient documentation

## 2023-03-23 DIAGNOSIS — N8110 Cystocele, unspecified: Secondary | ICD-10-CM | POA: Diagnosis present

## 2023-03-23 DIAGNOSIS — N393 Stress incontinence (female) (male): Secondary | ICD-10-CM

## 2023-03-23 DIAGNOSIS — N812 Incomplete uterovaginal prolapse: Secondary | ICD-10-CM | POA: Diagnosis present

## 2023-03-23 DIAGNOSIS — E039 Hypothyroidism, unspecified: Secondary | ICD-10-CM | POA: Insufficient documentation

## 2023-03-23 DIAGNOSIS — I10 Essential (primary) hypertension: Secondary | ICD-10-CM | POA: Diagnosis not present

## 2023-03-23 DIAGNOSIS — Z01818 Encounter for other preprocedural examination: Secondary | ICD-10-CM

## 2023-03-23 HISTORY — PX: BLADDER SUSPENSION: SHX72

## 2023-03-23 HISTORY — PX: CYSTOSCOPY: SHX5120

## 2023-03-23 HISTORY — DX: Hypothyroidism, unspecified: E03.9

## 2023-03-23 HISTORY — DX: Presence of spectacles and contact lenses: Z97.3

## 2023-03-23 HISTORY — PX: XI ROBOTIC ASSISTED TOTAL HYSTERECTOMY WITH SACROCOLPOPEXY: SHX6825

## 2023-03-23 LAB — TYPE AND SCREEN
ABO/RH(D): O NEG
Antibody Screen: NEGATIVE

## 2023-03-23 SURGERY — HYSTERECTOMY, TOTAL, ROBOT-ASSISTED, WITH SACROCOLPOPEXY
Anesthesia: General | Site: Vagina

## 2023-03-23 MED ORDER — HYDROMORPHONE HCL 1 MG/ML IJ SOLN
INTRAMUSCULAR | Status: AC
  Filled 2023-03-23: qty 1

## 2023-03-23 MED ORDER — MIDAZOLAM HCL 2 MG/2ML IJ SOLN
INTRAMUSCULAR | Status: DC | PRN
  Administered 2023-03-23: 1 mg via INTRAVENOUS

## 2023-03-23 MED ORDER — KETOROLAC TROMETHAMINE 30 MG/ML IJ SOLN
INTRAMUSCULAR | Status: AC
  Filled 2023-03-23: qty 2

## 2023-03-23 MED ORDER — LACTATED RINGERS IV BOLUS
500.0000 mL | Freq: Once | INTRAVENOUS | Status: AC
  Administered 2023-03-23: 500 mL via INTRAVENOUS

## 2023-03-23 MED ORDER — KETAMINE HCL 10 MG/ML IJ SOLN
INTRAMUSCULAR | Status: DC | PRN
  Administered 2023-03-23: 20 mg via INTRAVENOUS

## 2023-03-23 MED ORDER — OXYCODONE HCL 5 MG PO TABS: 5.0000 mg | ORAL_TABLET | Freq: Once | ORAL | Status: DC | PRN

## 2023-03-23 MED ORDER — HYDROCHLOROTHIAZIDE 25 MG PO TABS
25.0000 mg | ORAL_TABLET | Freq: Every day | ORAL | Status: DC
  Administered 2023-03-24: 25 mg via ORAL
  Filled 2023-03-23: qty 1

## 2023-03-23 MED ORDER — SUGAMMADEX SODIUM 200 MG/2ML IV SOLN
INTRAVENOUS | Status: DC | PRN
  Administered 2023-03-23: 200 mg via INTRAVENOUS

## 2023-03-23 MED ORDER — IBUPROFEN 200 MG PO TABS
600.0000 mg | ORAL_TABLET | Freq: Four times a day (QID) | ORAL | Status: DC
  Administered 2023-03-23 – 2023-03-24 (×2): 600 mg via ORAL

## 2023-03-23 MED ORDER — DEXAMETHASONE SODIUM PHOSPHATE 10 MG/ML IJ SOLN
INTRAMUSCULAR | Status: AC
  Filled 2023-03-23: qty 2

## 2023-03-23 MED ORDER — KETAMINE HCL 50 MG/5ML IJ SOSY
PREFILLED_SYRINGE | INTRAMUSCULAR | Status: AC
  Filled 2023-03-23: qty 5

## 2023-03-23 MED ORDER — ONDANSETRON HCL 4 MG/2ML IJ SOLN
INTRAMUSCULAR | Status: DC | PRN
  Administered 2023-03-23: 4 mg via INTRAVENOUS

## 2023-03-23 MED ORDER — LACTATED RINGERS IV SOLN: INTRAVENOUS | Status: DC

## 2023-03-23 MED ORDER — EPHEDRINE SULFATE-NACL 50-0.9 MG/10ML-% IV SOSY
PREFILLED_SYRINGE | INTRAVENOUS | Status: DC | PRN
  Administered 2023-03-23: 10 mg via INTRAVENOUS
  Administered 2023-03-23 (×2): 5 mg via INTRAVENOUS

## 2023-03-23 MED ORDER — ROCURONIUM BROMIDE 10 MG/ML (PF) SYRINGE
PREFILLED_SYRINGE | INTRAVENOUS | Status: AC
  Filled 2023-03-23: qty 40

## 2023-03-23 MED ORDER — ONDANSETRON HCL 4 MG/2ML IJ SOLN: 4.0000 mg | Freq: Once | INTRAMUSCULAR | Status: DC | PRN

## 2023-03-23 MED ORDER — BUPIVACAINE HCL (PF) 0.25 % IJ SOLN
INTRAMUSCULAR | Status: DC | PRN
  Administered 2023-03-23: 22 mL

## 2023-03-23 MED ORDER — FENTANYL CITRATE (PF) 250 MCG/5ML IJ SOLN
INTRAMUSCULAR | Status: AC
  Filled 2023-03-23: qty 5

## 2023-03-23 MED ORDER — LIDOCAINE-EPINEPHRINE 1 %-1:100000 IJ SOLN
INTRAMUSCULAR | Status: DC | PRN
  Administered 2023-03-23: 10 mL

## 2023-03-23 MED ORDER — LIDOCAINE HCL (PF) 2 % IJ SOLN
INTRAMUSCULAR | Status: AC
  Filled 2023-03-23: qty 10

## 2023-03-23 MED ORDER — GENTAMICIN SULFATE 40 MG/ML IJ SOLN
440.0000 mg | INTRAVENOUS | Status: AC
  Administered 2023-03-23: 440 mg via INTRAVENOUS
  Filled 2023-03-23: qty 11

## 2023-03-23 MED ORDER — HYDROMORPHONE HCL 2 MG/ML IJ SOLN
INTRAMUSCULAR | Status: AC
  Filled 2023-03-23: qty 1

## 2023-03-23 MED ORDER — GABAPENTIN 300 MG PO CAPS
ORAL_CAPSULE | ORAL | Status: AC
  Filled 2023-03-23: qty 1

## 2023-03-23 MED ORDER — OXYCODONE HCL 5 MG PO TABS
5.0000 mg | ORAL_TABLET | ORAL | Status: DC | PRN
  Administered 2023-03-23 – 2023-03-24 (×2): 5 mg via ORAL

## 2023-03-23 MED ORDER — AMISULPRIDE (ANTIEMETIC) 5 MG/2ML IV SOLN: 10.0000 mg | Freq: Once | INTRAVENOUS | Status: DC | PRN

## 2023-03-23 MED ORDER — TAMOXIFEN CITRATE 10 MG PO TABS
20.0000 mg | ORAL_TABLET | Freq: Every day | ORAL | Status: DC
  Administered 2023-03-24: 20 mg via ORAL
  Filled 2023-03-23: qty 2

## 2023-03-23 MED ORDER — LIDOCAINE 2% (20 MG/ML) 5 ML SYRINGE
INTRAMUSCULAR | Status: DC | PRN
  Administered 2023-03-23: 60 mg via INTRAVENOUS

## 2023-03-23 MED ORDER — POLYETHYLENE GLYCOL 3350 17 G PO PACK
17.0000 g | PACK | Freq: Every day | ORAL | Status: DC
  Administered 2023-03-23: 17 g via ORAL
  Filled 2023-03-23: qty 1

## 2023-03-23 MED ORDER — ACETAMINOPHEN 325 MG PO TABS: 650.0000 mg | ORAL_TABLET | ORAL | Status: DC | PRN

## 2023-03-23 MED ORDER — EPHEDRINE 5 MG/ML INJ
INTRAVENOUS | Status: AC
  Filled 2023-03-23: qty 5

## 2023-03-23 MED ORDER — LISINOPRIL 40 MG PO TABS
40.0000 mg | ORAL_TABLET | Freq: Every day | ORAL | Status: DC
  Administered 2023-03-24: 40 mg via ORAL
  Filled 2023-03-23: qty 1

## 2023-03-23 MED ORDER — ONDANSETRON HCL 4 MG/2ML IJ SOLN
INTRAMUSCULAR | Status: AC
  Filled 2023-03-23: qty 4

## 2023-03-23 MED ORDER — LEVOTHYROXINE SODIUM 25 MCG PO TABS
25.0000 ug | ORAL_TABLET | Freq: Every day | ORAL | Status: DC
  Administered 2023-03-24: 25 ug via ORAL
  Filled 2023-03-23: qty 1

## 2023-03-23 MED ORDER — ACETAMINOPHEN 500 MG PO TABS
ORAL_TABLET | ORAL | Status: AC
  Filled 2023-03-23: qty 2

## 2023-03-23 MED ORDER — PROPOFOL 10 MG/ML IV BOLUS
INTRAVENOUS | Status: DC | PRN
  Administered 2023-03-23: 120 mg via INTRAVENOUS

## 2023-03-23 MED ORDER — CLINDAMYCIN PHOSPHATE 900 MG/50ML IV SOLN
900.0000 mg | INTRAVENOUS | Status: AC
  Administered 2023-03-23: 900 mg via INTRAVENOUS

## 2023-03-23 MED ORDER — FENTANYL CITRATE (PF) 250 MCG/5ML IJ SOLN
INTRAMUSCULAR | Status: DC | PRN
  Administered 2023-03-23: 50 ug via INTRAVENOUS
  Administered 2023-03-23 (×4): 25 ug via INTRAVENOUS
  Administered 2023-03-23: 50 ug via INTRAVENOUS

## 2023-03-23 MED ORDER — PHENYLEPHRINE 80 MCG/ML (10ML) SYRINGE FOR IV PUSH (FOR BLOOD PRESSURE SUPPORT)
PREFILLED_SYRINGE | INTRAVENOUS | Status: AC
  Filled 2023-03-23: qty 20

## 2023-03-23 MED ORDER — OXYCODONE HCL 5 MG/5ML PO SOLN: 5.0000 mg | Freq: Once | ORAL | Status: DC | PRN

## 2023-03-23 MED ORDER — ONDANSETRON HCL 4 MG PO TABS: 4.0000 mg | ORAL_TABLET | Freq: Four times a day (QID) | ORAL | Status: DC | PRN

## 2023-03-23 MED ORDER — PHENAZOPYRIDINE HCL 100 MG PO TABS
ORAL_TABLET | ORAL | Status: AC
  Filled 2023-03-23: qty 2

## 2023-03-23 MED ORDER — MIDAZOLAM HCL 2 MG/2ML IJ SOLN
INTRAMUSCULAR | Status: AC
  Filled 2023-03-23: qty 2

## 2023-03-23 MED ORDER — CLINDAMYCIN PHOSPHATE 900 MG/50ML IV SOLN
INTRAVENOUS | Status: AC
  Filled 2023-03-23: qty 50

## 2023-03-23 MED ORDER — ROCURONIUM BROMIDE 10 MG/ML (PF) SYRINGE
PREFILLED_SYRINGE | INTRAVENOUS | Status: DC | PRN
  Administered 2023-03-23: 80 mg via INTRAVENOUS
  Administered 2023-03-23: 20 mg via INTRAVENOUS

## 2023-03-23 MED ORDER — PROPOFOL 10 MG/ML IV BOLUS
INTRAVENOUS | Status: AC
  Filled 2023-03-23: qty 20

## 2023-03-23 MED ORDER — SIMETHICONE 80 MG PO CHEW: 80.0000 mg | CHEWABLE_TABLET | Freq: Four times a day (QID) | ORAL | Status: DC | PRN

## 2023-03-23 MED ORDER — KETOROLAC TROMETHAMINE 30 MG/ML IJ SOLN: 15.0000 mg | Freq: Once | INTRAMUSCULAR | Status: DC | PRN

## 2023-03-23 MED ORDER — MORPHINE SULFATE (PF) 4 MG/ML IV SOLN: 1.0000 mg | INTRAVENOUS | Status: DC | PRN

## 2023-03-23 MED ORDER — HYDRALAZINE HCL 20 MG/ML IJ SOLN
INTRAMUSCULAR | Status: AC
  Filled 2023-03-23: qty 1

## 2023-03-23 MED ORDER — HYDROMORPHONE HCL 1 MG/ML IJ SOLN
0.2500 mg | INTRAMUSCULAR | Status: DC | PRN
  Administered 2023-03-23 (×2): 0.25 mg via INTRAVENOUS

## 2023-03-23 MED ORDER — DEXAMETHASONE SODIUM PHOSPHATE 10 MG/ML IJ SOLN
INTRAMUSCULAR | Status: DC | PRN
  Administered 2023-03-23: 4 mg via INTRAVENOUS

## 2023-03-23 MED ORDER — ACETAMINOPHEN 500 MG PO TABS
1000.0000 mg | ORAL_TABLET | ORAL | Status: AC
  Administered 2023-03-23: 1000 mg via ORAL

## 2023-03-23 MED ORDER — SODIUM CHLORIDE 0.9 % IR SOLN
Status: DC | PRN
  Administered 2023-03-23: 1000 mL

## 2023-03-23 MED ORDER — IBUPROFEN 200 MG PO TABS
ORAL_TABLET | ORAL | Status: AC
  Filled 2023-03-23: qty 3

## 2023-03-23 MED ORDER — PHENYLEPHRINE 80 MCG/ML (10ML) SYRINGE FOR IV PUSH (FOR BLOOD PRESSURE SUPPORT)
PREFILLED_SYRINGE | INTRAVENOUS | Status: DC | PRN
  Administered 2023-03-23 (×2): 80 ug via INTRAVENOUS

## 2023-03-23 MED ORDER — PHENYLEPHRINE HCL-NACL 20-0.9 MG/250ML-% IV SOLN
INTRAVENOUS | Status: DC | PRN
  Administered 2023-03-23: 50 ug/min via INTRAVENOUS

## 2023-03-23 MED ORDER — PHENAZOPYRIDINE HCL 100 MG PO TABS
200.0000 mg | ORAL_TABLET | ORAL | Status: AC
  Administered 2023-03-23: 200 mg via ORAL

## 2023-03-23 MED ORDER — ONDANSETRON HCL 4 MG/2ML IJ SOLN: 4.0000 mg | Freq: Four times a day (QID) | INTRAMUSCULAR | Status: DC | PRN

## 2023-03-23 MED ORDER — STERILE WATER FOR IRRIGATION IR SOLN
Status: DC | PRN
  Administered 2023-03-23: 500 mL

## 2023-03-23 MED ORDER — HYDROMORPHONE HCL 1 MG/ML IJ SOLN
INTRAMUSCULAR | Status: DC | PRN
  Administered 2023-03-23: .5 mg via INTRAVENOUS

## 2023-03-23 MED ORDER — DIPHENHYDRAMINE HCL 50 MG/ML IJ SOLN
INTRAMUSCULAR | Status: AC
  Filled 2023-03-23: qty 1

## 2023-03-23 MED ORDER — POLYETHYLENE GLYCOL 3350 17 G PO PACK
PACK | ORAL | Status: AC
  Filled 2023-03-23: qty 1

## 2023-03-23 MED ORDER — METOPROLOL SUCCINATE ER 100 MG PO TB24
100.0000 mg | ORAL_TABLET | Freq: Every day | ORAL | Status: DC
  Administered 2023-03-24: 100 mg via ORAL
  Filled 2023-03-23: qty 1

## 2023-03-23 MED ORDER — OXYCODONE HCL 5 MG PO TABS
ORAL_TABLET | ORAL | Status: AC
  Filled 2023-03-23: qty 1

## 2023-03-23 MED ORDER — KETOROLAC TROMETHAMINE 30 MG/ML IJ SOLN
INTRAMUSCULAR | Status: DC | PRN
  Administered 2023-03-23: 30 mg via INTRAVENOUS

## 2023-03-23 MED ORDER — GABAPENTIN 300 MG PO CAPS
300.0000 mg | ORAL_CAPSULE | ORAL | Status: AC
  Administered 2023-03-23: 300 mg via ORAL

## 2023-03-23 SURGICAL SUPPLY — 99 items
ADH SKN CLS APL DERMABOND .7 (GAUZE/BANDAGES/DRESSINGS) ×6
AGENT HMST KT MTR STRL THRMB (HEMOSTASIS)
APL PRP STRL LF DISP 70% ISPRP (MISCELLANEOUS) ×6
APL SWBSTK 6 STRL LF DISP (MISCELLANEOUS) ×6
APPLICATOR COTTON TIP 6 STRL (MISCELLANEOUS) IMPLANT
APPLICATOR COTTON TIP 6IN STRL (MISCELLANEOUS) ×6
BAG DRN RND TRDRP ANRFLXCHMBR (UROLOGICAL SUPPLIES)
BAG URINE DRAIN 2000ML AR STRL (UROLOGICAL SUPPLIES) IMPLANT
BLADE CLIPPER SENSICLIP SURGIC (BLADE) ×3 IMPLANT
BLADE SURG 15 STRL LF DISP TIS (BLADE) ×3 IMPLANT
BLADE SURG 15 STRL SS (BLADE) ×3
CATH FOLEY 3WAY  5CC 16FR (CATHETERS) ×3
CATH FOLEY 3WAY 5CC 16FR (CATHETERS) ×3 IMPLANT
CHLORAPREP W/TINT 26 (MISCELLANEOUS) ×3 IMPLANT
COVER BACK TABLE 60X90IN (DRAPES) ×3 IMPLANT
COVER TIP SHEARS 8 DVNC (MISCELLANEOUS) ×3 IMPLANT
COVER TIP SHEARS 8MM DA VINCI (MISCELLANEOUS) ×3
DEFOGGER SCOPE WARMER CLEARIFY (MISCELLANEOUS) ×3 IMPLANT
DERMABOND ADVANCED .7 DNX12 (GAUZE/BANDAGES/DRESSINGS) ×3 IMPLANT
DRAPE ARM DVNC X/XI (DISPOSABLE) ×12 IMPLANT
DRAPE COLUMN DVNC XI (DISPOSABLE) ×3 IMPLANT
DRAPE DA VINCI XI ARM (DISPOSABLE) ×12
DRAPE DA VINCI XI COLUMN (DISPOSABLE) ×3
DRAPE SHEET LG 3/4 BI-LAMINATE (DRAPES) ×3 IMPLANT
DRAPE SURG IRRIG POUCH 19X23 (DRAPES) ×3 IMPLANT
DRAPE UTILITY XL STRL (DRAPES) ×3 IMPLANT
DRIVER NDL MEGA SUTCUT DVNCXI (INSTRUMENTS) ×3 IMPLANT
DRIVER NDLE MEGA SUTCUT DVNCXI (INSTRUMENTS) ×3 IMPLANT
DRIVER NEEDLE MEGA SUTURECU XI (INSTRUMENTS) ×3
ELECT REM PT RETURN 9FT ADLT (ELECTROSURGICAL) ×3
ELECTRODE REM PT RTRN 9FT ADLT (ELECTROSURGICAL) ×3 IMPLANT
GAUZE 4X4 16PLY ~~LOC~~+RFID DBL (SPONGE) IMPLANT
GLOVE BIOGEL PI IND STRL 6.5 (GLOVE) ×12 IMPLANT
GLOVE BIOGEL PI IND STRL 7.0 (GLOVE) ×6 IMPLANT
GLOVE ECLIPSE 6.0 STRL STRAW (GLOVE) ×9 IMPLANT
GOWN STRL REUS W/TWL LRG LVL3 (GOWN DISPOSABLE) ×3 IMPLANT
HIBICLENS CHG 4% 4OZ BTL (MISCELLANEOUS) ×3 IMPLANT
HOLDER FOLEY CATH W/STRAP (MISCELLANEOUS) ×3 IMPLANT
IRRIG SUCT STRYKERFLOW 2 WTIP (MISCELLANEOUS) ×3
IRRIGATION SUCT STRKRFLW 2 WTP (MISCELLANEOUS) ×3 IMPLANT
KIT PINK PAD W/HEAD ARE REST (MISCELLANEOUS) ×3
KIT PINK PAD W/HEAD ARM REST (MISCELLANEOUS) ×3 IMPLANT
KIT TURNOVER CYSTO (KITS) ×3 IMPLANT
LEGGING LITHOTOMY PAIR STRL (DRAPES) ×3 IMPLANT
MANIFOLD NEPTUNE II (INSTRUMENTS) ×3 IMPLANT
MANIPULATOR ADVINCU DEL 2.5 PL (MISCELLANEOUS) IMPLANT
MANIPULATOR ADVINCU DEL 3.0 PL (MISCELLANEOUS) IMPLANT
MANIPULATOR ADVINCU DEL 3.5 PL (MISCELLANEOUS) IMPLANT
MANIPULATOR ADVINCU DEL 4.0 PL (MISCELLANEOUS) IMPLANT
MESH VERTESSA LITE -Y 2X4X3 (Mesh General) IMPLANT
NDL HYPO 22X1.5 SAFETY MO (MISCELLANEOUS) ×3 IMPLANT
NDL INSUFFLATION 14GA 120MM (NEEDLE) ×3 IMPLANT
NEEDLE HYPO 22X1.5 SAFETY MO (MISCELLANEOUS) ×3 IMPLANT
NEEDLE INSUFFLATION 14GA 120MM (NEEDLE) ×3 IMPLANT
NS IRRIG 1000ML POUR BTL (IV SOLUTION) ×3 IMPLANT
OBTURATOR OPTICAL STANDARD 8MM (TROCAR) ×3
OBTURATOR OPTICAL STND 8 DVNC (TROCAR) ×3
OBTURATOR OPTICALSTD 8 DVNC (TROCAR) ×3 IMPLANT
PACK CYSTO (CUSTOM PROCEDURE TRAY) ×3 IMPLANT
PACK ROBOT WH (CUSTOM PROCEDURE TRAY) ×3 IMPLANT
PACK ROBOTIC GOWN (GOWN DISPOSABLE) ×3 IMPLANT
PACK VAGINAL WOMENS (CUSTOM PROCEDURE TRAY) ×3 IMPLANT
PAD OB MATERNITY 4.3X12.25 (PERSONAL CARE ITEMS) ×3 IMPLANT
PAD PREP 24X48 CUFFED NSTRL (MISCELLANEOUS) ×3 IMPLANT
POUCH LAPAROSCOPIC INSTRUMENT (MISCELLANEOUS) IMPLANT
PROTECTOR NERVE ULNAR (MISCELLANEOUS) ×3 IMPLANT
RETRACTOR LONE STAR DISPOSABLE (INSTRUMENTS) ×3 IMPLANT
RETRACTOR STAY HOOK 5MM (MISCELLANEOUS) ×3 IMPLANT
SCISSORS LAP 5X35 DISP (ENDOMECHANICALS) IMPLANT
SCISSORS MNPLR CVD DVNC XI (INSTRUMENTS) ×3 IMPLANT
SCISSORS XI MNPLR CVD DVNC (INSTRUMENTS) ×3
SEAL UNIV 5-12 XI (MISCELLANEOUS) ×15 IMPLANT
SEAL XI UNIVERSAL 5-12 (MISCELLANEOUS) ×15
SEALER VESSEL DA VINCI XI (MISCELLANEOUS) ×3
SEALER VESSEL EXT DVNC XI (MISCELLANEOUS) IMPLANT
SET IRRIG Y TYPE TUR BLADDER L (SET/KITS/TRAYS/PACK) ×3 IMPLANT
SET TUBE SMOKE EVAC HIGH FLOW (TUBING) ×3 IMPLANT
SLEEVE SCD COMPRESS KNEE MED (STOCKING) ×3 IMPLANT
SPIKE FLUID TRANSFER (MISCELLANEOUS) ×6 IMPLANT
SUCTION FRAZIER HANDLE 10FR (MISCELLANEOUS) ×3
SUCTION TUBE FRAZIER 10FR DISP (MISCELLANEOUS) ×3 IMPLANT
SURGIFLO W/THROMBIN 8M KIT (HEMOSTASIS) IMPLANT
SUT ABS MONO DBL WITH NDL 48IN (SUTURE) IMPLANT
SUT GORETEX NAB #0 THX26 36IN (SUTURE) ×3 IMPLANT
SUT MNCRL AB 4-0 PS2 18 (SUTURE) ×3 IMPLANT
SUT MON AB 2-0 SH 27 (SUTURE) ×3 IMPLANT
SUT V-LOC BARB 180 2/0GR9 GS23 (SUTURE) ×6
SUT VIC AB 0 CT1 27 (SUTURE) ×6
SUT VIC AB 0 CT1 27XBRD ANTBC (SUTURE) ×6 IMPLANT
SUT VIC AB 2-0 SH 27 (SUTURE) ×3
SUT VIC AB 2-0 SH 27XBRD (SUTURE) ×3 IMPLANT
SUT VLOC 180 0 9IN  GS21 (SUTURE)
SUT VLOC 180 0 9IN GS21 (SUTURE) IMPLANT
SUT VLOC 180 2-0 9IN GS21 (SUTURE) IMPLANT
SUTURE V-LC BRB 180 2/0GR9GS23 (SUTURE) ×6 IMPLANT
SYR BULB EAR ULCER 3OZ GRN STR (SYRINGE) ×3 IMPLANT
SYS SLING ADV FIT BLUE TRNSVAG (Sling) IMPLANT
TOWEL OR 17X24 6PK STRL BLUE (TOWEL DISPOSABLE) ×3 IMPLANT
TRAY FOLEY W/BAG SLVR 14FR LF (SET/KITS/TRAYS/PACK) ×3 IMPLANT

## 2023-03-23 NOTE — Anesthesia Procedure Notes (Signed)
Procedure Name: Intubation Date/Time: 03/23/2023 11:25 AM  Performed by: Dairl Ponder, CRNAPre-anesthesia Checklist: Patient identified, Emergency Drugs available, Suction available and Patient being monitored Patient Re-evaluated:Patient Re-evaluated prior to induction Oxygen Delivery Method: Circle System Utilized Preoxygenation: Pre-oxygenation with 100% oxygen Induction Type: IV induction Ventilation: Mask ventilation without difficulty Laryngoscope Size: Glidescope and 3 (Limited neck ROM) Grade View: Grade I Tube type: Oral Tube size: 7.0 mm Number of attempts: 1 Airway Equipment and Method: Stylet and Oral airway Placement Confirmation: ETT inserted through vocal cords under direct vision, positive ETCO2 and breath sounds checked- equal and bilateral Secured at: 23 cm Tube secured with: Tape Dental Injury: Teeth and Oropharynx as per pre-operative assessment

## 2023-03-23 NOTE — Anesthesia Postprocedure Evaluation (Signed)
Anesthesia Post Note  Patient: Victoria Wallace  Procedure(s) Performed: XI ROBOTIC ASSISTED TOTAL HYSTERECTOMY WITH BILATERAL SALPINGO-OOPHORECTOMY AND  SACROCOLPOPEXY/LYSIS OF ADHESIONS (Pelvis) TRANSVAGINAL TAPE (TVT) PROCEDURE (possible) (Vagina ) CYSTOSCOPY (Bladder)     Patient location during evaluation: PACU Anesthesia Type: General Level of consciousness: awake and alert Pain management: pain level controlled Vital Signs Assessment: post-procedure vital signs reviewed and stable Respiratory status: spontaneous breathing, nonlabored ventilation and respiratory function stable Cardiovascular status: blood pressure returned to baseline and stable Postop Assessment: no apparent nausea or vomiting Anesthetic complications: no  No notable events documented.  Last Vitals:  Vitals:   03/23/23 1515 03/23/23 1522  BP: 120/71   Pulse: 68 61  Resp: 17 11  Temp:  (!) 35.4 C  SpO2: 98% 98%    Last Pain:  Vitals:   03/23/23 1509  TempSrc:   PainSc: 4                  Deonta Bomberger,W. EDMOND

## 2023-03-23 NOTE — Interval H&P Note (Signed)
History and Physical Interval Note:  03/23/2023 10:50 AM  Victoria Wallace  has presented today for surgery, with the diagnosis of anterior vaginal prolapse; uterovaginal prolapse incomplete..  The various methods of treatment have been discussed with the patient and family. After consideration of risks, benefits and other options for treatment, the patient has consented to  Procedure(s) with comments: XI ROBOTIC ASSISTED TOTAL HYSTERECTOMY WITH BILATERAL SALPINGO-OOPHORECTOMY AND  SACROCOLPOPEXY (N/A)  TRANSVAGINAL TAPE (TVT) PROCEDURE (possible) (N/A) CYSTOSCOPY (N/A) as a surgical intervention.  The patient's history has been reviewed, patient examined, no change in status, stable for surgery.  I have reviewed the patient's chart and labs.  Questions were answered to the patient's satisfaction.     Marguerita Beards

## 2023-03-23 NOTE — Op Note (Addendum)
Operative Note  Preoperative Diagnosis: anterior vaginal prolapse, uterovaginal prolapse, incomplete, and stress urinary incontinence  Postoperative Diagnosis: same  Procedures performed:  Robotic assisted lysis of adhesions, total laparoscopic hysterectomy, bilateral salpingo-oophorectomy, sacrocolpopexy [name], midurethral sling (Advantage Fit), cystoscopy  Implants:  Implant Name Type Inv. Item Serial No. Manufacturer Lot No. LRB No. Used Action  MESH Grayland Ormond [id number] [employer]esh [employer] MESH Grayland Ormond [phone]  [facility] MEDICAL [id number] N/A 1 Implanted  SYS SLING ADV FIT BLUE TRNSVAG - [id number] Sling SYS SLING ADV FIT BLUE TRNSVAG  [employer] [id number] N/A 1 Implanted    Attending Surgeon: Lanetta Inch, MD  Anesthesia: General endotracheal  Findings: 1. On vaginal exam, stage II prolapse noted.  Pessary removed.   2. On laparoscopy, adhesions of omentum to anterior abdomen in mid left side. Small bowel adhesions to right pelvic sidewall. Normal appearing uterus, bilateral fallopian tubes and ovaries. 3cm simple appearing paratubal cyst on the right.   3. On cystoscopy, normal appearing bladder mucosa with very thin, translucent area on posterior bladder wall, approx 1cm. Bladder was water tight with no cystotomy. Brisk bilateral ureteral efflux present.   Specimens:  ID Type Source Tests Collected by Time Destination  1 : uterus, cervix, bilateral fallopian tubes and ovaries Tissue PATH Gyn benign resection SURGICAL PATHOLOGY Marguerita Beards, MD 03/23/2023 1251     Estimated blood loss: 75 mL  IV fluids: 1100 mL  Urine output: 400 mL  Complications: thin area in bladder from dissection- foley catheter will remain in place on discharge  Procedure in Detail:  After informed consent was obtained, the patient was taken to the operating room, where general anesthesia was induced and found to be adequate. She was placed in dorsolithotomy  position in yellowfin stirrups. Her hips were noted not to be hyperflexed or hyperextended. Her arms were padded with gel pads and tucked to her sides. Her hands were surrounded by foam. A padded strap was placed across her chest with foam between the pad and her skin. She was noted to be appropriately positioned with all pressure points well padded and off tension. A tilt test showed no slippage. She was prepped and draped in the usual sterile fashion. A uterine manipulator was placed in the uterus after sounding to 7 cm, an appropriately sized Koh ring was placed around the cervix, and a pneumo-occluder balloon was positioned in the vagina for later use.  A sterile Foley catheter was inserted.   0.25% plain Marcaine was injected in the supraumbilical  area and an 8 mm supraumbilical skin incision was made with the scalpel.  A Veress needle was inserted into the incision, CO2 insufflation was started, a low opening pressure was noted, however insufflation was noted to be preperitoneal. The veress was removed and replaced into the peritoneal cavity, and pneumoperitoneum was obtained. The Veress needle was removed and a 42mm robotic trocar was placed. The robotic camera was inserted and intraperitoneal placement was confirmed. Survey of the abdomen and pelvis revealed the findings as noted above, specifically some adhesions in the left mid abdomen. After determining placement for the other ports, Local anesthetic was injected at each site and two 8 mm incisions were made for robotic ports at 10 cm lateral to and at the level of the umbilical port. Two additional 8 mm incisions were made 10 cm lateral to these and 30 degrees down followed by 8 mm robotic ports - the right side for an assistant port. All trocars  were placed sequentially under direct visualization of the camera. The patient was placed in Trendelenburg. Laparoscopic scissors were used to remove the omental adhesions. The robot was docked on the  patient's right ide. Monopolar endoshears alternating with the vessel sealer were placed in the right arm, a [place] bipolar grasper was placed in the 2nd arm of the patient's left side, and a Tip up grasper was placed in the 3rd arm on the patient's left side.   Attention was then turned to the robotic hysterectomy and BSO. The ureter was identified and was found to be well away from the planned site of incision. Using the monopolar scissors, a window was made on the posterior leaf of the broad ligament. The infundibulopelvic ligament was cauterized and transected.  The right round ligament was grasped, cauterized, and transected with electrocautery. The anterior and posterior leaves of the broad ligament were taken down with cautery and sharp dissection. The uterine artery was skeletonized and the bladder flap was created on the right side with a combination of electrosurgery and sharp dissection. The KOH ring was identified. The right uterine artery was clamped, cauterized, and transected. In a similar fashion, the left side was taken down. Further sharp dissection with combination of cautery was performed to further develop the bladder flap. At this point, the KOH ring was completely hugging the cervix. The pneumo-occluder balloon in the vagina was inflated to maintain pneumoperitoneum. A colpotomy was performed with electrosurgical cutting current and the uterus and cervix were completely amputated from the vagina. The specimen was delivered through the vagina. The posterior portion of the vaginal cuff was then grasped and pulled up to maintain pneumoperitoneum. The pneumo-occluder balloon was then replaced in the vagina. The right hand instrument was changed to a suture-cut needle driver. The vaginal cuff was then closed using a 0 V-lock suture in two layers.    The right hand instrument was replaced with monopolar endoshears. With a lucite probe in the vagina, the anterior vaginal dissection was then  performed with sharp dissection and electrosurgery. The posterior vaginal dissection was then performed with sharp dissection and electrosurgery in order to dissect the rectum away from the posterior vagina. Small bowel was adhesed to the right pelvic peritoneum which prevented access to the sacrum. Cold scissors were used to incised the filmy adhesions until the bowel could be safely retracted. Attention was then turned to the sacral promontory. The peritoneum overlying the sacral promontory was tented up, dissected sharply with monopolar scissors and electrosurgery using layer by layer technique. The peritoneal incision was extended down to the posterior cul-de-sac. This was performed with care to avoid the ureter on the right side and the sigmoid colon and its mesentary on the left side.  A "Y" mesh was then inserted into the abdomen after trimming to appropriate size. With the probe in the vagina, the anterior leaf of the Y mesh was affixed to the anterior portion of the vagina using a 2-0 v-loc suture in a spiral pattern to distribute the suture evenly across the surface of the anterior mesh leaf. In a similar fashion, the posterior leaf of the Y mesh was attached to the posterior surface of the vagina with 2-0 v-loc suture.  The distal end of the mesh was then brought to overlie the sacrum. The correct amount of tension was determined in order to elevate the vagina, but not put the mesh under tension. The distal end of the mesh was then affixed to the anterior longitudinal sacral ligament  using two interrupted transverse stitches of CV2 Gortex. The excess distal mesh was then cut and removed. The peritoneum was reapproximated over the mesh using 2-0 monocryl. The bladder flap was incorporated to completely retroperitonealize the mesh. All pedicles were carefully inspected and noted to be hemostatic as the CO2 gas was deflated. All instruments were removed from the patient's abdomen.   The Foley catheter was  removed.  A 70-degree cystoscope was introduced, and 360-degree inspection revealed a 1cm area on the posterior bladder that was thin and translucent but water-tight. No cystotomy was noted. Brisk bilateral ureteral efflux was noted with the assistance of pyridium.  The bladder was drained and the cystoscope was removed.  The Foley catheter was replaced.  The robot was undocked. The CO2 gas was removed and the ports were removed.  The skin incisions were closed with subcutaneous stitches of 4-0 Monocryl and covered with skin glue.  Midurethral sling was performed next. A lonestar self-retraining retractor was placed with 4 stay hooks. The mid urethral area was located on the anterior vaginal wall.  Two Allis clamps were placed at the level of the midurethra. 1% lidocaine with epinephrine was injected into the vaginal mucosa. A vertical incision was made between the two clamps using a 15-blade scalpel.  Using sharp dissection, Metzenbaum scissors were used to make a periurethral tunnel from the vaginal incision towards the pubic rami bilaterally for the future sling tracts. The bladder was ensured to be empty. The trocar and attached sling were introduced into the right side of the periurethral vaginal incision, just inferior to the pubic symphysis on the right side. The trocar was guided through the endopelvic fascia and directly vertically.  While hugging the cephalad surface of the pubic bone, the trocar was guided out through the abdomen 2 fingerbreadths lateral to midline at the level of the pubic symphysis on the ipsilateral side. The trocar was placed on the left side in a similar fashion.  A 70-degree cystoscope was introduced, and 360-degree inspection revealed no trauma or trocars in the bladder, with brisk bilateral ureteral efflux.  The bladder was drained and the cystoscope was removed.  The Foley catheter was reinserted.  The sling was brought to lie beneath the mid-urethra.  A needle driver was  placed behind the sling to ensure no tension.   The plastic sheath was removed from the sling and the distal ends of the sling were trimmed just below the level of the skin incisions.  Tension-free positioning of the sling was confirmed. Vaginal inspection revealed no vaginotomy or sling perforations of the mucosa.  The vaginal mucosal edges were reapproximated using 2-0 Vicryl.  The vagina was copiously irrigated.  Hemostasis was again noted. Vaginal packing not placed. The suprapubic sling incisions were closed with Dermabond.     Sponge, lap, and needle counts were correct x 2. The patient tolerated the procedure well. She was awakened from anesthesia and transferred to the recovery room in stable condition.   Marguerita BeardsMichelle N Safiyah Cisney, MD

## 2023-03-23 NOTE — Transfer of Care (Signed)
Immediate Anesthesia Transfer of Care Note  Patient: TERRIAH ZIELSDORF  Procedure(s) Performed: XI ROBOTIC ASSISTED TOTAL HYSTERECTOMY WITH BILATERAL SALPINGO-OOPHORECTOMY AND  SACROCOLPOPEXY/LYSIS OF ADHESIONS (Pelvis) TRANSVAGINAL TAPE (TVT) PROCEDURE (possible) (Vagina ) CYSTOSCOPY (Bladder)  Patient Location: PACU  Anesthesia Type:General  Level of Consciousness: drowsy and patient cooperative  Airway & Oxygen Therapy: Patient Spontanous Breathing  Post-op Assessment: Report given to RN and Post -op Vital signs reviewed and stable  Post vital signs: Reviewed and stable  Last Vitals:  Vitals Value Taken Time  BP 118/75 03/23/23 1500  Temp    Pulse 71 03/23/23 1502  Resp 13 03/23/23 1502  SpO2 99 % 03/23/23 1502  Vitals shown include unvalidated device data.  Last Pain:  Vitals:   03/23/23 0936  TempSrc: Oral  PainSc: 0-No pain      Patients Stated Pain Goal: 6 (03/23/23 0936)  Complications: No notable events documented.

## 2023-03-23 NOTE — Discharge Instructions (Signed)

## 2023-03-23 NOTE — Anesthesia Preprocedure Evaluation (Addendum)
Anesthesia Evaluation  Patient identified by MRN, date of birth, ID band Patient awake    Reviewed: Allergy & Precautions, NPO status , Patient's Chart, lab work & pertinent test results, reviewed documented beta blocker date and time   Airway Mallampati: II  TM Distance: >3 FB Neck ROM: Full    Dental  (+) Teeth Intact, Dental Advisory Given   Pulmonary neg pulmonary ROS   Pulmonary exam normal breath sounds clear to auscultation       Cardiovascular hypertension (146/75 preop), Pt. on medications and Pt. on home beta blockers Normal cardiovascular exam Rhythm:Regular Rate:Normal     Neuro/Psych  Headaches  negative psych ROS   GI/Hepatic negative GI ROS, Neg liver ROS,,,  Endo/Other  Hypothyroidism    Renal/GU negative Renal ROS  negative genitourinary   Musculoskeletal  (+) Arthritis , Osteoarthritis,    Abdominal   Peds  Hematology  (+) Blood dyscrasia, anemia Hb 11.3, plt 204   Anesthesia Other Findings   Reproductive/Obstetrics negative OB ROS                             Anesthesia Physical Anesthesia Plan  ASA: 2  Anesthesia Plan: General   Post-op Pain Management: Tylenol PO (pre-op)*, Toradol IV (intra-op)*, Dilaudid IV and Ketamine IV*   Induction: Intravenous  PONV Risk Score and Plan: 4 or greater and Ondansetron, Dexamethasone, Midazolam and Treatment may vary due to age or medical condition  Airway Management Planned: Oral ETT  Additional Equipment: None  Intra-op Plan:   Post-operative Plan: Extubation in OR  Informed Consent: I have reviewed the patients History and Physical, chart, labs and discussed the procedure including the risks, benefits and alternatives for the proposed anesthesia with the patient or authorized representative who has indicated his/her understanding and acceptance.     Dental advisory given  Plan Discussed with: CRNA  Anesthesia  Plan Comments:        Anesthesia Quick Evaluation

## 2023-03-24 DIAGNOSIS — N812 Incomplete uterovaginal prolapse: Secondary | ICD-10-CM | POA: Diagnosis not present

## 2023-03-24 MED ORDER — OXYCODONE HCL 5 MG PO TABS
ORAL_TABLET | ORAL | Status: AC
  Filled 2023-03-24: qty 1

## 2023-03-24 MED ORDER — IBUPROFEN 200 MG PO TABS
ORAL_TABLET | ORAL | Status: AC
  Filled 2023-03-24: qty 3

## 2023-03-24 NOTE — Discharge Summary (Signed)
Physician Discharge Summary  Patient ID: Victoria Wallace MRN: [id number] DOB/AGE: 73-Sep-1952 73 y.o.  Admit date: 03/23/2023 Discharge date: 03/24/2023  Admission Diagnoses:  Discharge Diagnoses:  Principal Problem:   Vaginal prolapse Active Problems:   Uterovaginal prolapse, incomplete   Discharged Condition: good  Hospital Course: [name] presenting for scheduled surgery: Robotic assisted lysis of adhesions, total laparoscopic hysterectomy, bilateral salpingo-oophorectomy, sacrocolpopexy Victoria Wallace Lite Y), midurethral sling (Advantage Fit), cystoscopy. Post operatively, pain was well controlled on oral medication, she tolerated regular diet and was able to ambulate. Foley catheter was left in place. She was deemed stable for discharge on POD #1.    Consults: None  Significant Diagnostic Studies: none  Treatments: surgery: see above  Discharge Exam: Blood pressure (!) 129/56, pulse 79, temperature 98.3 F (36.8 C), resp. rate 18, height 5\' 10"  (1.778 m), weight 94.4 kg, SpO2 99 %. General appearance: alert and cooperative Resp: clear to auscultation bilaterally Cardio: regular rate and rhythm, S1, S2 normal, no murmur, click, rub or gallop GI: soft, non-tender; bowel sounds normal; no masses,  no organomegaly and laparoscopic insicions c/d/i Extremities: extremities normal, atraumatic, no cyanosis or edema  Disposition: Discharge disposition: 01-Home or Self Care       Discharge Instructions     Call MD for:  persistant nausea and vomiting   Complete by: As directed    Call MD for:  redness, tenderness, or signs of infection (pain, swelling, redness, odor or green/yellow discharge around incision site)   Complete by: As directed    Call MD for:  severe uncontrolled pain   Complete by: As directed    Call MD for:  temperature >100.4   Complete by: As directed    Diet general   Complete by: As directed    Increase activity slowly   Complete by: As directed    May  walk up steps   Complete by: As directed       Allergies as of 03/24/2023       Reactions   Codeine Rash   Penicillins Swelling, Rash   PT WAS ABOUT 72 YEARS OLD  Has patient had a PCN reaction causing immediate rash, facial/tongue/throat swelling, SOB or lightheadedness with hypotension: Yes Has patient had a PCN reaction causing severe rash involving mucus membranes or skin necrosis: Unknown Has patient had a PCN reaction that required hospitalization: No Has patient had a PCN reaction occurring within the last 10 years: No If all of the above answers are "NO", then may proceed with Cephalosporin use.   Tape    STICKY PART ON HEART MONITOR         Medication List     TAKE these medications    acetaminophen 500 MG tablet Commonly known as: TYLENOL Take 1 tablet (500 mg total) by mouth every 6 (six) hours as needed (pain).   cholecalciferol 25 MCG (1000 UNIT) tablet Commonly known as: VITAMIN D3 Take 1,000 Units by mouth daily.   clobetasol ointment 0.05 % Commonly known as: TEMOVATE APPLY THIN LAYER TOPICALLY TO THE AFFECTED AREA TWICE DAILY   Estradiol 10 MCG Tabs vaginal tablet Place vaginally once a week.   estradiol 0.1 MG/GM vaginal cream Commonly known as: ESTRACE Place 1 Applicatorful vaginally 2 (two) times a week.   hydrochlorothiazide 25 MG tablet Commonly known as: HYDRODIURIL Take 25 mg by mouth daily.   ibuprofen 600 MG tablet Commonly known as: ADVIL Take 1 tablet (600 mg total) by mouth every 6 (six) hours as needed.  levothyroxine 25 MCG tablet Commonly known as: Synthroid Take 1 tablet (25 mcg total) by mouth daily before breakfast.   lisinopril 40 MG tablet Commonly known as: ZESTRIL Take 40 mg by mouth daily.   metoprolol succinate 100 MG 24 hr tablet Commonly known as: TOPROL-XL Take 100 mg by mouth daily.   multivitamin with minerals Tabs tablet Take 1 tablet by mouth daily. Pt takes three times a week.   oxyCODONE 5 MG  immediate release tablet Commonly known as: Oxy IR/ROXICODONE Take 1 tablet (5 mg total) by mouth every 4 (four) hours as needed for severe pain.   polyethylene glycol powder 17 GM/SCOOP powder Commonly known as: GLYCOLAX/MIRALAX Take 17 g by mouth daily. Drink 17g (1 scoop) dissolved in water per day.   tamoxifen 20 MG tablet Commonly known as: NOLVADEX Take 1 tablet (20 mg total) by mouth daily.         Signed: Marguerita Beards 03/24/2023, 9:00 AM

## 2023-03-26 ENCOUNTER — Encounter (HOSPITAL_BASED_OUTPATIENT_CLINIC_OR_DEPARTMENT_OTHER): Payer: Self-pay | Admitting: Obstetrics and Gynecology

## 2023-03-26 LAB — SURGICAL PATHOLOGY

## 2023-03-26 NOTE — Progress Notes (Signed)
Dilaudid 0.5 mg wasted in med stericycle with Bland Span, RN a witness.Marland Kitchen

## 2023-03-29 ENCOUNTER — Ambulatory Visit (INDEPENDENT_AMBULATORY_CARE_PROVIDER_SITE_OTHER): Payer: Medicare PPO

## 2023-03-29 DIAGNOSIS — Z9889 Other specified postprocedural states: Secondary | ICD-10-CM

## 2023-03-29 NOTE — Progress Notes (Signed)
Urogyn Nurse Voiding Trial Note  Elzie Rings underwent XI ROBOTIC ASSISTED TOTAL HYSTERECTOMY WITH BILATERAL SALPINGO-OOPHORECTOMY AND  SACROCOLPOPEXY/LYSIS OF ADHESIONS (Pelvis)  TRANSVAGINAL TAPE (TVT) PROCEDURE (possible) (Vagina )  CYSTOSCOPY (Bladder) on 03/23/23.  She presents today for a voiding trial .   Patient was identified with 2 indicators. of NS was instilled into the bladder via the catheter. The catheter was removed and patient was instructed to void into the urinary hat. She voided .  She passed the voiding trial and a catheter was not replaced.   The patient received aftercare instructions and will follow up as scheduled.

## 2023-03-29 NOTE — Patient Instructions (Signed)
Keep all follow up appointments

## 2023-04-20 ENCOUNTER — Encounter: Payer: Self-pay | Admitting: Obstetrics and Gynecology

## 2023-04-20 ENCOUNTER — Ambulatory Visit (INDEPENDENT_AMBULATORY_CARE_PROVIDER_SITE_OTHER): Payer: Medicare PPO | Admitting: Obstetrics and Gynecology

## 2023-04-20 VITALS — BP 129/79 | HR 69

## 2023-04-20 DIAGNOSIS — N952 Postmenopausal atrophic vaginitis: Secondary | ICD-10-CM

## 2023-04-20 DIAGNOSIS — N3281 Overactive bladder: Secondary | ICD-10-CM

## 2023-04-20 DIAGNOSIS — N3941 Urge incontinence: Secondary | ICD-10-CM

## 2023-04-20 MED ORDER — ESTRADIOL 0.1 MG/GM VA CREA
TOPICAL_CREAM | VAGINAL | 11 refills | Status: DC
Start: 2023-04-23 — End: 2024-08-13

## 2023-04-20 MED ORDER — TROSPIUM CHLORIDE ER 60 MG PO CP24
1.0000 | ORAL_CAPSULE | Freq: Every day | ORAL | 5 refills | Status: DC
Start: 2023-04-20 — End: 2023-08-30

## 2023-04-20 NOTE — Progress Notes (Signed)
[facility]  Date of Visit: 04/20/2023  History of Present Illness: Ms. Gevorkyan is a 72 y.o. female scheduled today for a post-operative visit.   Surgery: s/p Robotic assisted lysis of adhesions, total laparoscopic hysterectomy, bilateral salpingo-oophorectomy, sacrocolpopexy [name], midurethral sling (Advantage Fit), cystoscopy on 03/23/23  Voiding trial was delayed for one week due to thin area in the bladder (no cystotomy).  She passed her postoperative void trial on 03/29/23.   Postoperative course has been uncomplicated.   Today she reports she feels tired and a bit sore but has been improving. Has not needed pain medication other than 400mg  ibuprofen daily. Has not been using the vaginal estrogen cream.   UTI in the last 6 weeks? No  Pain?  Some, see above She has not returned to her normal activity (except for postop restrictions) Vaginal bulge? No  Stress incontinence: No  Urgency/frequency: No  Urge incontinence: Yes - small dribble Voiding dysfunction: No  Bowel issues:  has some constipation but improving, taking miralax daily and 2 stool softeners. She has been very careful not to strain.   Subjective Success: Do you usually have a bulge or something falling out that you can see or feel in the vaginal area? No  Retreatment Success: Any retreatment with surgery or pessary for any compartment? No   Pathology results: UTERUS, CERVIX, BILATERAL FALLOPIAN TUBES AND OVARIES:  - Uterus with benign inactive endometrium  - Benign unremarkable cervix  - Benign unremarkable bilateral fallopian tubes with a benign right  paratubal cyst  - No evidence of malignancy   Medications: She has a current medication list which includes the following prescription(s): cholecalciferol, clobetasol ointment, estradiol, [START ON 04/23/2023] estradiol, estradiol, hydrochlorothiazide, ibuprofen, levothyroxine, lisinopril, metoprolol succinate, multivitamin with minerals,  polyethylene glycol powder, tamoxifen, and trospium chloride.   Allergies: Patient is allergic to codeine, penicillins, and tape.   Physical Exam: BP 129/79   Pulse 69   Abdomen: soft, non-tender, without masses or organomegaly Laparoscopic Incisions: healing well.  Pelvic Examination: Vagina: Incisions healing well. Sutures are present at incision line and there is not granulation tissue. No tenderness along the anterior or posterior vagina. No apical tenderness. No pelvic masses. No visible or palpable mesh.  POP-Q: POP-Q  -3                                            Aa   -3                                           Ba  -7                                              C   2                                            Gh  4.5  Pb  7                                            tvl   -3                                            Ap  -3                                            Bp                                                 D     ---------------------------------------------------------  Assessment and Plan:  1. Overactive bladder   2. Vaginal atrophy     - restart with estrogen twice a week. Will recheck sutures at next visit.  - Pathology results were reviewed with the patient today and she verbalized understanding that the results were benign.  - Wait an additional 2 weeks for exercise. Can swim starting now.  - Trospium 60mg  ER daily ordered for urge incontinence symptoms. Cannot take myrbetriq due to potential interaction with tamoxifen.   Return 1 month.    Marguerita Beards, MD

## 2023-05-22 ENCOUNTER — Encounter: Payer: Self-pay | Admitting: Obstetrics and Gynecology

## 2023-05-22 ENCOUNTER — Ambulatory Visit (INDEPENDENT_AMBULATORY_CARE_PROVIDER_SITE_OTHER): Payer: Medicare PPO | Admitting: Obstetrics and Gynecology

## 2023-05-22 VITALS — BP 124/78 | HR 77

## 2023-05-22 DIAGNOSIS — N393 Stress incontinence (female) (male): Secondary | ICD-10-CM

## 2023-05-22 DIAGNOSIS — Z9889 Other specified postprocedural states: Secondary | ICD-10-CM

## 2023-05-22 DIAGNOSIS — K5909 Other constipation: Secondary | ICD-10-CM

## 2023-05-22 DIAGNOSIS — N3281 Overactive bladder: Secondary | ICD-10-CM

## 2023-05-22 NOTE — Patient Instructions (Signed)
Pumpkin seed extract up to 5gm a day (Start at [employer] and see if this is helpful)

## 2023-05-22 NOTE — Progress Notes (Signed)
[facility]  Date of Visit: 05/22/2023  History of Present Illness: Ms. Schenk is a 72 y.o. female scheduled today for a post-operative visit.   Surgery: s/p Robotic assisted lysis of adhesions, total laparoscopic hysterectomy, bilateral salpingo-oophorectomy, sacrocolpopexy [name], midurethral sling (Advantage Fit), cystoscopy on 03/23/23  Voiding trial was delayed for one week due to thin area in the bladder (no cystotomy).  She passed her postoperative void trial on 03/29/23.   Postoperative course has been uncomplicated.   Today she reports she is doing great overall but is urinating 12 times a day and 1-2 times at night. Did not start the Trospium as she really wants to do more holistic measures and "be the best she can be".     UTI in the last 6 weeks? No  Pain? No  She has not returned to her normal activity (except for postop restrictions) Vaginal bulge? No  Stress incontinence: No  Urgency/frequency: No  Urge incontinence: Yes - bits of dribble Voiding dysfunction: No  Bowel issues: Yes -Constipation but has a hx of constipation. Taking Miralax  Subjective Success: Do you usually have a bulge or something falling out that you can see or feel in the vaginal area? No  Retreatment Success: Any retreatment with surgery or pessary for any compartment? No   Pathology results: UTERUS, CERVIX, BILATERAL FALLOPIAN TUBES AND OVARIES:  - Uterus with benign inactive endometrium  - Benign unremarkable cervix  - Benign unremarkable bilateral fallopian tubes with a benign right  paratubal cyst  - No evidence of malignancy   Medications: She has a current medication list which includes the following prescription(s): cholecalciferol, clobetasol ointment, estradiol, estradiol, estradiol, hydrochlorothiazide, ibuprofen, levothyroxine, lisinopril, metoprolol succinate, multivitamin with minerals, polyethylene glycol powder, tamoxifen, and trospium chloride.    Allergies: Patient is allergic to codeine, penicillins, and tape.   Physical Exam: BP 124/78   Pulse 77   Abdomen: soft, non-tender, without masses or organomegaly Laparoscopic Incisions: healing well, some irritation at umbilicus.  Pelvic Examination: Vagina: Incisions healing well. Sutures are not present at incision line and there is not granulation tissue. No tenderness along the anterior or posterior vagina. No apical tenderness. No pelvic masses. No visible or palpable mesh.  POP-Q:  POP-Q   -3                                            Aa   -3                                           Ba   -7                                              C    2                                            Gh   4.5  Pb   7                                            tvl    -3                                            Ap   -3                                            Bp         ---------------------------------------------------------  Assessment and Plan:  1. Post-operative state   2. Overactive bladder   3. SUI (stress urinary incontinence, female)   4. Other constipation    - Has been doing estrogen x2 weekly and sutures have dissolved. Encouraged to continue estrogen cream use.  -Would like to do PT. Referral placed.  -Has not been taking Trospium, she wants to see what she can do on her own. -Discussed being intentional with her drinking and decreasing bladder irritants.  -Discussed using pumpkin seed extract for bladder support.     Return after physical therapy for re-evaluation of OAB symptoms.   Selmer Dominion, NP

## 2023-06-11 ENCOUNTER — Ambulatory Visit: Payer: Medicare PPO | Attending: Obstetrics and Gynecology | Admitting: Physical Therapy

## 2023-06-11 ENCOUNTER — Other Ambulatory Visit: Payer: Self-pay

## 2023-06-11 DIAGNOSIS — R279 Unspecified lack of coordination: Secondary | ICD-10-CM | POA: Diagnosis present

## 2023-06-11 DIAGNOSIS — R293 Abnormal posture: Secondary | ICD-10-CM | POA: Diagnosis present

## 2023-06-11 DIAGNOSIS — M6281 Muscle weakness (generalized): Secondary | ICD-10-CM | POA: Diagnosis present

## 2023-06-11 NOTE — Therapy (Signed)
OUTPATIENT PHYSICAL THERAPY FEMALE PELVIC EVALUATION   Patient Name: Victoria Wallace MRN: [id number] DOB:08/28/1951, 72 Wallace.o., female Today's Date: 06/11/2023  END OF SESSION:  PT End of Session - 06/11/23 1545     Visit Number 1    Date for PT Re-Evaluation 09/11/23    Authorization Type humana MCR    PT Start Time 1530    PT Stop Time 1615    PT Time Calculation (min) 45 min    Activity Tolerance Patient tolerated treatment well    Behavior During Therapy [facility] for tasks assessed/performed             Past Medical History:  Diagnosis Date   Abnormal dexamethasone suppression test 06/2014   recieved info from patient   Arthritis    all over   Asymptomatic microscopic hematuria    monitored by PCP    Chronic constipation    uses Miralax   Ductal carcinoma in situ (DCIS) of right breast    Follows w/ Dr. Pamelia Wallace, oncology.   Encounter for hepatitis C screening test for low risk patient 05/18/2017   Flu vaccine 08/13/2017   History of colonoscopy 2012   Hypertension    Follows w/ PCP, Victoria Hill, MD.   Hypothyroidism    Follows w/ PCP, Victoria Hill, MD at [facility].   Ovarian cyst 2024   right   shingles vaccine 2011   recieved info from patient   Tetanus toxoid vaccination administered within the past year 05/18/2017   Uterine prolapse    Wears Pessirey   Wears glasses    reading only   Past Surgical History:  Procedure Laterality Date   BLADDER SUSPENSION N/A 03/23/2023   Procedure: TRANSVAGINAL TAPE (TVT) PROCEDURE (possible);  Surgeon: Victoria Beards, MD;  Location: [facility];  Service: Gynecology;  Laterality: N/A;   BREAST BIOPSY Right    x 2   CYSTOSCOPY N/A 03/23/2023   Procedure: CYSTOSCOPY;  Surgeon: Victoria Beards, MD;  Location: [facility];  Service: Gynecology;  Laterality: N/A;   ENDOMETRIAL BIOPSY  01/01/2023   benign   EYE SURGERY     Retinal laser surgery   LUMBAR LAMINECTOMY Bilateral 2002    MOUTH SURGERY     Dental and gum   TOTAL 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   Procedure: RIGHT TOTAL KNEE ARTHROPLASTY;  Surgeon: Durene Romans, MD;  Location: [facility];  Service: Orthopedics;  Laterality: Right;  70 mins   TOTAL MASTECTOMY Right 10/20/2021   Procedure: RIGHT TOTAL MASTECTOMY;  Surgeon: Emelia Loron, MD;  Location: [facility];  Service: General;  Laterality: Right;   XI ROBOTIC ASSISTED TOTAL HYSTERECTOMY WITH SACROCOLPOPEXY N/A 03/23/2023   Procedure: XI ROBOTIC ASSISTED TOTAL HYSTERECTOMY WITH BILATERAL SALPINGO-OOPHORECTOMY AND  SACROCOLPOPEXY/LYSIS OF ADHESIONS;  Surgeon: Victoria Beards, MD;  Location: [facility];  Service: Gynecology;  Laterality: N/A;  Total time requested is 3.5hours   Patient Active Problem List   Diagnosis Date Noted   Vaginal prolapse 03/23/2023   Uterovaginal prolapse, incomplete 03/23/2023   Diverticular disease of colon 02/20/2022   Obesity 02/20/2022   Allergic rhinitis 02/20/2022   Posterior vitreous detachment of right eye 01/23/2022   Ductal carcinoma in situ (DCIS) of right breast 11/21/2021   S/P mastectomy, right 10/20/2021   Posterior vitreous detachment of left eye 11/30/2020   Vitreous floaters, left 11/30/2020   Round hole of right eye 11/30/2020   Vitreous floaters, right 11/30/2020   Nuclear sclerotic  cataract of right eye 11/30/2020   Nuclear sclerotic cataract of left eye 11/30/2020   Round hole of retina without detachment 11/30/2020   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   Uterovaginal prolapse 01/14/2020   Essential hypertension 01/14/2020   S/P right TKA 08/28/2017   S/P total knee replacement 08/28/2017    PCP: Victoria Quitter, MD  REFERRING PROVIDER: Selmer Dominion, NP   REFERRING DIAG: N32.81 (ICD-10-CM) - Overactive bladder Z98.890 (ICD-10-CM) - Post-operative state N39.3 (ICD-10-CM) - SUI (stress urinary incontinence,  female) K59.09 (ICD-10-CM) - Other constipation  THERAPY DIAG:  Muscle weakness (generalized)  Abnormal posture  Unspecified lack of coordination  Rationale for Evaluation and Treatment: Rehabilitation  ONSET DATE: years  SUBJECTIVE:                                                                                                                                                                                           SUBJECTIVE STATEMENT: Mild vaginal pain for the last 2-3 days but "not bad at all". Had hysterectomy in April 2024, chronic constipation, no longer pushing for bowel movements. Back to working out with [employer]. No longer feels poor urges, sensation helping, less leakage since surgery but still happens.   Fluid intake: Yes: water - ~20oz water, half cup coffee    PAIN:  Are you having pain? No   PRECAUTIONS: Other: hysterectomy in 4/24  WEIGHT BEARING RESTRICTIONS: No  FALLS:  Has patient fallen in last 6 months? No  LIVING ENVIRONMENT: Lives with: lives with their family Lives in: House/apartment  OCCUPATION: retired  PLOF: Independent  PATIENT GOALS: to have stronger pelvic floor and lessen risk for return of prolapse  PERTINENT HISTORY:  Surgery: s/p Robotic assisted lysis of adhesions, total laparoscopic hysterectomy, bilateral salpingo-oophorectomy, sacrocolpopexy Victoria Wallace), midurethral sling (Advantage Fit), cystoscopy on 03/23/23 Sexual abuse: No  BOWEL MOVEMENT: Pain with bowel movement: No Type of bowel movement:Type (Bristol Stool Scale) 3-4, Frequency somewhat more regularly, and Strain No Fully empty rectum: Yes:   Leakage: No Pads: No Fiber supplement: No - does use softeners   URINATION: Pain with urination: No Fully empty bladder: Yes:   Stream: Strong and Weak Urgency: No Frequency: not quicker than 2 hours, normal for her, 1-2x per night  Leakage:  not now, improved since surgery  Pads: Yes: AAT but just in  case  INTERCOURSE: Pain with intercourse:  not active Ability to have vaginal penetration:  Yes:   Climax: not painful Marinoff Scale: 0/3  PREGNANCY: Vaginal deliveries 0 Tearing No C-section deliveries 0 Currently pregnant No  PROLAPSE: Not since  surgery    OBJECTIVE:   DIAGNOSTIC FINDINGS:    COGNITION: Overall cognitive status: Within functional limits for tasks assessed   SENSATION: Light touch: Appears intact Proprioception: Appears intact  MUSCLE LENGTH: Bil hamstrings and adductors limited by 25%   POSTURE: rounded shoulders, forward head, and posterior pelvic tilt  PELVIC ALIGNMENT: WFL  LUMBARAROM/PROM:  A/PROM A/PROM  eval  Flexion Limited by 25%  Extension WFL  Right lateral flexion Limited by 25%  Left lateral flexion Limited by 25%  Right rotation Limited by 25%  Left rotation Limited by 25%   (Blank rows = not tested)  LOWER EXTREMITY ROM:  WFL  LOWER EXTREMITY MMT: Bil Hip abduction 3/5, all other hips 4/5; knees 5/5 PALPATION:   General  no TTP at lumbar, glutes, abdominals but did have fascial restrictions                 External Perineal Exam no TTP                             Internal Pelvic Floor no TTP, slight tension but no pain and tissue mobility with very minimal mobility  Patient confirms identification and approves PT to assess internal pelvic floor and treatment Yes  PELVIC MMT:   MMT eval  Vaginal 1/5, unable to hold it  Internal Anal Sphincter   External Anal Sphincter   Puborectalis   Diastasis Recti   (Blank rows = not tested)        TONE: WFL  PROLAPSE: Not seen in hooklying   TODAY'S TREATMENT:                                                                                                                              DATE:   06/11/23 EVAL Examination completed, findings reviewed, pt educated on POC, HEP. Pt motivated to participate in PT and agreeable to attempt recommendations.     PATIENT  EDUCATION:  Education details: [id number] Person educated: Patient Education method: Explanation, Demonstration, Tactile cues, Verbal cues, and Handouts Education comprehension: verbalized understanding, returned demonstration, verbal cues required, tactile cues required, and needs further education  HOME EXERCISE PROGRAM: [id number]  ASSESSMENT:  CLINICAL IMPRESSION: Patient is a 72 Wallace.o. female  who was seen today for physical therapy evaluation and treatment for status post hysterectomy 03/2023, mild urinary leakage, constipation, and pelvic weakness. Pt found to have decreased flexibility, decreased core and hip strength, fascial restrictions at abdomen. Patient consented to internal pelvic floor assessment vaginally this date and found to have decreased strength, endurance, and coordination. Patient benefited from verbal cues for improved technique with pelvic floor contractions with great difficulty gaining contraction, initially demonstrated pushing downward and improved with reps and cues but only to 1/5 strength. .Pt would benefit from additional PT to further address deficits.    OBJECTIVE IMPAIRMENTS: decreased coordination, decreased endurance, decreased mobility, decreased strength, increased fascial restrictions, impaired  flexibility, improper body mechanics, and postural dysfunction.   ACTIVITY LIMITATIONS: continence  PARTICIPATION LIMITATIONS: community activity  PERSONAL FACTORS: Time since onset of injury/illness/exacerbation are also affecting patient's functional outcome.   REHAB POTENTIAL: Good  CLINICAL DECISION MAKING: Stable/uncomplicated  EVALUATION COMPLEXITY: Low   GOALS: Goals reviewed with patient? Yes  SHORT TERM GOALS: Target date: 07/09/23  Pt to be I with HEP.  Baseline: Goal status: INITIAL  2.  Pt to demonstrate improved coordination of pelvic floor and breathing mechanics with squat with appropriate synergistic patterns to decrease pain and leakage  at least 50% of the time.    Baseline:  Goal status: INITIAL  3.  Pt will have 25% less urgency due to bladder retraining and strengthening  Baseline:  Goal status: INITIAL  4.  Pt to demonstrate at least 2/5 pelvic floor strength for improved pelvic stability and decreased strain at pelvic floor/ decrease leakage.  Baseline:  Goal status: INITIAL   LONG TERM GOALS: Target date: 09/11/23  Pt to be I with advanced HEP.  Baseline:  Goal status: INITIAL  2.  Pt to demonstrate improved coordination of pelvic floor and breathing mechanics with 10# squat with appropriate synergistic patterns to decrease pain and leakage at least 75% of the time.    Baseline:  Goal status: INITIAL  3.  Pt will report her BMs are complete due to improved bowel habits and evacuation techniques.  Baseline:  Goal status: INITIAL  4.  Pt to demonstrate at least 4/5 pelvic floor strength for improved pelvic stability and decreased strain at pelvic floor/ decrease leakage.  Baseline:  Goal status: INITIAL  5.  Pt to demonstrate at least 5/5 bil hip strength for improved pelvic stability and functional squats without leakage.  Baseline:  Goal status: INITIAL  PLAN:  PT FREQUENCY: every other week  PT DURATION:  8 sessions  PLANNED INTERVENTIONS: Therapeutic exercises, Therapeutic activity, Neuromuscular re-education, Patient/Family education, Self Care, Joint mobilization, DME instructions, Aquatic Therapy, Dry Needling, Electrical stimulation, Spinal mobilization, Cryotherapy, Moist heat, scar mobilization, Taping, Biofeedback, and Manual therapy  PLAN FOR NEXT SESSION: internal/biofeedback/ultrasound for feedback; breathing/voiding mechanics, breathing/pelvic floor/core coordination, stretching core/hips/pelvic floor, strengthening core/hips/back/pelvic floor, pressure management  Otelia Sergeant, PT, DPT 06/24/245:09 PM

## 2023-07-11 ENCOUNTER — Ambulatory Visit: Payer: Medicare PPO | Attending: Obstetrics and Gynecology | Admitting: Physical Therapy

## 2023-07-11 DIAGNOSIS — M6281 Muscle weakness (generalized): Secondary | ICD-10-CM | POA: Insufficient documentation

## 2023-07-11 DIAGNOSIS — R279 Unspecified lack of coordination: Secondary | ICD-10-CM | POA: Insufficient documentation

## 2023-07-11 DIAGNOSIS — R293 Abnormal posture: Secondary | ICD-10-CM | POA: Diagnosis present

## 2023-07-11 NOTE — Therapy (Signed)
OUTPATIENT PHYSICAL THERAPY FEMALE PELVIC TREATMENT   Patient Name: Victoria Wallace MRN: [id number] DOB:June 18, 1951, 72 Wallace.o., female Today's Date: 07/11/2023  END OF SESSION:  PT End of Session - 07/11/23 1102     Visit Number 2    Date for PT Re-Evaluation 09/11/23    Authorization Type humana MCR    PT Start Time 1100    PT Stop Time 1140    PT Time Calculation (min) 40 min    Activity Tolerance Patient tolerated treatment well    Behavior During Therapy [facility] for tasks assessed/performed             Past Medical History:  Diagnosis Date   Abnormal dexamethasone suppression test 06/2014   recieved info from patient   Arthritis    all over   Asymptomatic microscopic hematuria    monitored by PCP    Chronic constipation    uses Miralax   Ductal carcinoma in situ (DCIS) of right breast    Follows w/ Dr. Pamelia Wallace, oncology.   Encounter for hepatitis C screening test for low risk patient 05/18/2017   Flu vaccine 08/13/2017   History of colonoscopy 2012   Hypertension    Follows w/ PCP, Victoria Hill, MD.   Hypothyroidism    Follows w/ PCP, Victoria Hill, MD at [facility].   Ovarian cyst 2024   right   shingles vaccine 2011   recieved info from patient   Tetanus toxoid vaccination administered within the past year 05/18/2017   Uterine prolapse    Wears Pessirey   Wears glasses    reading only   Past Surgical History:  Procedure Laterality Date   BLADDER SUSPENSION N/A 03/23/2023   Procedure: TRANSVAGINAL TAPE (TVT) PROCEDURE (possible);  Surgeon: Victoria Beards, MD;  Location: [facility];  Service: Gynecology;  Laterality: N/A;   BREAST BIOPSY Right    x 2   CYSTOSCOPY N/A 03/23/2023   Procedure: CYSTOSCOPY;  Surgeon: Victoria Beards, MD;  Location: [facility];  Service: Gynecology;  Laterality: N/A;   ENDOMETRIAL BIOPSY  01/01/2023   benign   EYE SURGERY     Retinal laser surgery   LUMBAR LAMINECTOMY Bilateral 2002    MOUTH SURGERY     Dental and gum   TOTAL 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   Procedure: RIGHT TOTAL KNEE ARTHROPLASTY;  Surgeon: Victoria Romans, MD;  Location: [facility];  Service: Orthopedics;  Laterality: Right;  70 mins   TOTAL MASTECTOMY Right 10/20/2021   Procedure: RIGHT TOTAL MASTECTOMY;  Surgeon: Victoria Loron, MD;  Location: [facility];  Service: General;  Laterality: Right;   XI ROBOTIC ASSISTED TOTAL HYSTERECTOMY WITH SACROCOLPOPEXY N/A 03/23/2023   Procedure: XI ROBOTIC ASSISTED TOTAL HYSTERECTOMY WITH BILATERAL SALPINGO-OOPHORECTOMY AND  SACROCOLPOPEXY/LYSIS OF ADHESIONS;  Surgeon: Victoria Beards, MD;  Location: [facility];  Service: Gynecology;  Laterality: N/A;  Total time requested is 3.5hours   Patient Active Problem List   Diagnosis Date Noted   Vaginal prolapse 03/23/2023   Uterovaginal prolapse, incomplete 03/23/2023   Diverticular disease of colon 02/20/2022   Obesity 02/20/2022   Allergic rhinitis 02/20/2022   Posterior vitreous detachment of right eye 01/23/2022   Ductal carcinoma in situ (DCIS) of right breast 11/21/2021   S/P mastectomy, right 10/20/2021   Posterior vitreous detachment of left eye 11/30/2020   Vitreous floaters, left 11/30/2020   Round hole of right eye 11/30/2020   Vitreous floaters, right 11/30/2020   Nuclear sclerotic  cataract of right eye 11/30/2020   Nuclear sclerotic cataract of left eye 11/30/2020   Round hole of retina without detachment 11/30/2020   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   Uterovaginal prolapse 01/14/2020   Essential hypertension 01/14/2020   S/P right TKA 08/28/2017   S/P total knee replacement 08/28/2017    PCP: Victoria Quitter, MD  REFERRING PROVIDER: Selmer Dominion, NP   REFERRING DIAG: N32.81 (ICD-10-CM) - Overactive bladder Z98.890 (ICD-10-CM) - Post-operative state N39.3 (ICD-10-CM) - SUI (stress urinary incontinence,  female) K59.09 (ICD-10-CM) - Other constipation  THERAPY DIAG:  Muscle weakness (generalized)  Abnormal posture  Unspecified lack of coordination  Rationale for Evaluation and Treatment: Rehabilitation  ONSET DATE: years  SUBJECTIVE:                                                                                                                                                                                           SUBJECTIVE STATEMENT: "Everything is slowly getting better. Did have a loss of urine and stool after switching to more B12 vitamin complex but stopped taking it and had no further large instances of this."  Was able to ride to the beach without issues of leakage or urgency. Does still wear pads most of the time, small/thin usually and larger for travel.  Fluid intake: Yes: water - ~20oz water, half cup coffee    PAIN:  Are you having pain? No   PRECAUTIONS: Other: hysterectomy in 4/24  WEIGHT BEARING RESTRICTIONS: No  FALLS:  Has patient fallen in last 6 months? No  LIVING ENVIRONMENT: Lives with: lives with their family Lives in: House/apartment  OCCUPATION: retired  PLOF: Independent  PATIENT GOALS: to have stronger pelvic floor and lessen risk for return of prolapse  PERTINENT HISTORY:  Surgery: s/p Robotic assisted lysis of adhesions, total laparoscopic hysterectomy, bilateral salpingo-oophorectomy, sacrocolpopexy Victoria Wallace), midurethral sling (Advantage Fit), cystoscopy on 03/23/23 Sexual abuse: No  BOWEL MOVEMENT: Pain with bowel movement: No Type of bowel movement:Type (Bristol Stool Scale) 3-4, Frequency somewhat more regularly, and Strain No Fully empty rectum: Yes:   Leakage: No Pads: No Fiber supplement: No - does use softeners   URINATION: Pain with urination: No Fully empty bladder: Yes:   Stream: Strong and Weak Urgency: No Frequency: not quicker than 2 hours, normal for her, 1-2x per night  Leakage:  not now, improved since  surgery  Pads: Yes: AAT but just in case  INTERCOURSE: Pain with intercourse:  not active Ability to have vaginal penetration:  Yes:   Climax: not painful Marinoff Scale: 0/3  PREGNANCY: Vaginal deliveries  0 Tearing No C-section deliveries 0 Currently pregnant No  PROLAPSE: Not since surgery    OBJECTIVE:   DIAGNOSTIC FINDINGS:    COGNITION: Overall cognitive status: Within functional limits for tasks assessed   SENSATION: Light touch: Appears intact Proprioception: Appears intact  MUSCLE LENGTH: Bil hamstrings and adductors limited by 25%   POSTURE: rounded shoulders, forward head, and posterior pelvic tilt  PELVIC ALIGNMENT: WFL  LUMBARAROM/PROM:  A/PROM A/PROM  eval  Flexion Limited by 25%  Extension WFL  Right lateral flexion Limited by 25%  Left lateral flexion Limited by 25%  Right rotation Limited by 25%  Left rotation Limited by 25%   (Blank rows = not tested)  LOWER EXTREMITY ROM:  WFL  LOWER EXTREMITY MMT: Bil Hip abduction 3/5, all other hips 4/5; knees 5/5 PALPATION:   General  no TTP at lumbar, glutes, abdominals but did have fascial restrictions                 External Perineal Exam no TTP                             Internal Pelvic Floor no TTP, slight tension but no pain and tissue mobility with very minimal mobility  Patient confirms identification and approves PT to assess internal pelvic floor and treatment Yes  PELVIC MMT:   MMT eval  Vaginal 1/5, unable to hold it  Internal Anal Sphincter   External Anal Sphincter   Puborectalis   Diastasis Recti   (Blank rows = not tested)        TONE: WFL  PROLAPSE: Not seen in hooklying   TODAY'S TREATMENT:                                                                                                                              DATE:   06/11/23 EVAL Examination completed, findings reviewed, pt educated on POC, HEP. Pt motivated to participate in PT and agreeable to attempt  recommendations.    07/11/23: ITB stretch 2x30s each with gentle over pressure Lumbar rotations x10 each Seated ball squeezes 2x10 with pelvic floor contractions 2x10 ball squeeze with bridge + pelvic floor contractions 2x10 Sit to stand first set body weight second set 5# + pelvic floor contractions Hip machine : flexion and abduction 2x10 25# Piriformis stretch 2x30s each  PATIENT EDUCATION:  Education details: [email or web address] Person educated: Patient Education method: Explanation, Demonstration, Tactile cues, Verbal cues, and Handouts Education comprehension: verbalized understanding, returned demonstration, verbal cues required, tactile cues required, and needs further education  HOME EXERCISE PROGRAM: [id number]  ASSESSMENT:  CLINICAL IMPRESSION: Patient presents for treatment, has been doing HEP and notices gradual improvement so far with symptoms. Session focused on coordination of pelvic floor and breathing with exercises for improved pelvic floor strength and coordination for decreased symptoms. Pt did benefit from cues for technique and coordination throughout session.  Pt would benefit  from additional PT to further address deficits.    OBJECTIVE IMPAIRMENTS: decreased coordination, decreased endurance, decreased mobility, decreased strength, increased fascial restrictions, impaired flexibility, improper body mechanics, and postural dysfunction.   ACTIVITY LIMITATIONS: continence  PARTICIPATION LIMITATIONS: community activity  PERSONAL FACTORS: Time since onset of injury/illness/exacerbation are also affecting patient's functional outcome.   REHAB POTENTIAL: Good  CLINICAL DECISION MAKING: Stable/uncomplicated  EVALUATION COMPLEXITY: Low   GOALS: Goals reviewed with patient? Yes  SHORT TERM GOALS: Target date: 07/09/23  Pt to be I with HEP.  Baseline: Goal status: INITIAL  2.  Pt to demonstrate improved coordination of pelvic floor and breathing mechanics with squat  with appropriate synergistic patterns to decrease pain and leakage at least 50% of the time.    Baseline:  Goal status: INITIAL  3.  Pt will have 25% less urgency due to bladder retraining and strengthening  Baseline:  Goal status: INITIAL  4.  Pt to demonstrate at least 2/5 pelvic floor strength for improved pelvic stability and decreased strain at pelvic floor/ decrease leakage.  Baseline:  Goal status: INITIAL   LONG TERM GOALS: Target date: 09/11/23  Pt to be I with advanced HEP.  Baseline:  Goal status: INITIAL  2.  Pt to demonstrate improved coordination of pelvic floor and breathing mechanics with 10# squat with appropriate synergistic patterns to decrease pain and leakage at least 75% of the time.    Baseline:  Goal status: INITIAL  3.  Pt will report her BMs are complete due to improved bowel habits and evacuation techniques.  Baseline:  Goal status: INITIAL  4.  Pt to demonstrate at least 4/5 pelvic floor strength for improved pelvic stability and decreased strain at pelvic floor/ decrease leakage.  Baseline:  Goal status: INITIAL  5.  Pt to demonstrate at least 5/5 bil hip strength for improved pelvic stability and functional squats without leakage.  Baseline:  Goal status: INITIAL  PLAN:  PT FREQUENCY: every other week  PT DURATION:  8 sessions  PLANNED INTERVENTIONS: Therapeutic exercises, Therapeutic activity, Neuromuscular re-education, Patient/Family education, Self Care, Joint mobilization, DME instructions, Aquatic Therapy, Dry Needling, Electrical stimulation, Spinal mobilization, Cryotherapy, Moist heat, scar mobilization, Taping, Biofeedback, and Manual therapy  PLAN FOR NEXT SESSION: internal/biofeedback/ultrasound for feedback; breathing/voiding mechanics, breathing/pelvic floor/core coordination, stretching core/hips/pelvic floor, strengthening core/hips/back/pelvic floor, pressure management  Otelia Sergeant, PT, DPT 07/10/2410:58 AM

## 2023-07-11 NOTE — Patient Instructions (Signed)

## 2023-07-17 ENCOUNTER — Ambulatory Visit: Payer: Medicare PPO | Admitting: Physical Therapy

## 2023-08-08 ENCOUNTER — Ambulatory Visit: Payer: Medicare PPO | Attending: Obstetrics and Gynecology | Admitting: Physical Therapy

## 2023-08-08 DIAGNOSIS — M6281 Muscle weakness (generalized): Secondary | ICD-10-CM | POA: Diagnosis not present

## 2023-08-08 DIAGNOSIS — R279 Unspecified lack of coordination: Secondary | ICD-10-CM

## 2023-08-08 NOTE — Patient Instructions (Addendum)

## 2023-08-08 NOTE — Therapy (Addendum)
OUTPATIENT PHYSICAL THERAPY FEMALE PELVIC TREATMENT   Patient Name: Victoria Wallace MRN: [id number] DOB:Sep 27, 1951, 72 y.o., female Today's Date: 08/08/2023  END OF SESSION:  PT End of Session - 08/08/23 1218     Visit Number 3    Date for PT Re-Evaluation 09/11/23    Authorization Type humana MCR    PT Start Time 1220    PT Stop Time 1258    PT Time Calculation (min) 38 min    Activity Tolerance Patient tolerated treatment well    Behavior During Therapy [facility] for tasks assessed/performed             Past Medical History:  Diagnosis Date   Abnormal dexamethasone suppression test 06/2014   recieved info from patient   Arthritis    all over   Asymptomatic microscopic hematuria    monitored by PCP    Chronic constipation    uses Miralax   Ductal carcinoma in situ (DCIS) of right breast    Follows w/ Dr. Pamelia Hoit, oncology.   Encounter for hepatitis C screening test for low risk patient 05/18/2017   Flu vaccine 08/13/2017   History of colonoscopy 2012   Hypertension    Follows w/ PCP, Dorinda Hill, MD.   Hypothyroidism    Follows w/ PCP, Dorinda Hill, MD at [facility].   Ovarian cyst 2024   right   shingles vaccine 2011   recieved info from patient   Tetanus toxoid vaccination administered within the past year 05/18/2017   Uterine prolapse    Wears Pessirey   Wears glasses    reading only   Past Surgical History:  Procedure Laterality Date   BLADDER SUSPENSION N/A 03/23/2023   Procedure: TRANSVAGINAL TAPE (TVT) PROCEDURE (possible);  Surgeon: Marguerita Beards, MD;  Location: [facility];  Service: Gynecology;  Laterality: N/A;   BREAST BIOPSY Right    x 2   CYSTOSCOPY N/A 03/23/2023   Procedure: CYSTOSCOPY;  Surgeon: Marguerita Beards, MD;  Location: [facility];  Service: Gynecology;  Laterality: N/A;   ENDOMETRIAL BIOPSY  01/01/2023   benign   EYE SURGERY     Retinal laser surgery   LUMBAR LAMINECTOMY Bilateral 2002    MOUTH SURGERY     Dental and gum   TOTAL 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   Procedure: RIGHT TOTAL KNEE ARTHROPLASTY;  Surgeon: Durene Romans, MD;  Location: [facility];  Service: Orthopedics;  Laterality: Right;  70 mins   TOTAL MASTECTOMY Right 10/20/2021   Procedure: RIGHT TOTAL MASTECTOMY;  Surgeon: Emelia Loron, MD;  Location: [facility];  Service: General;  Laterality: Right;   XI ROBOTIC ASSISTED TOTAL HYSTERECTOMY WITH SACROCOLPOPEXY N/A 03/23/2023   Procedure: XI ROBOTIC ASSISTED TOTAL HYSTERECTOMY WITH BILATERAL SALPINGO-OOPHORECTOMY AND  SACROCOLPOPEXY/LYSIS OF ADHESIONS;  Surgeon: Marguerita Beards, MD;  Location: [facility];  Service: Gynecology;  Laterality: N/A;  Total time requested is 3.5hours   Patient Active Problem List   Diagnosis Date Noted   Vaginal prolapse 03/23/2023   Uterovaginal prolapse, incomplete 03/23/2023   Diverticular disease of colon 02/20/2022   Obesity 02/20/2022   Allergic rhinitis 02/20/2022   Posterior vitreous detachment of right eye 01/23/2022   Ductal carcinoma in situ (DCIS) of right breast 11/21/2021   S/P mastectomy, right 10/20/2021   Posterior vitreous detachment of left eye 11/30/2020   Vitreous floaters, left 11/30/2020   Round hole of right eye 11/30/2020   Vitreous floaters, right 11/30/2020   Nuclear sclerotic  cataract of right eye 11/30/2020   Nuclear sclerotic cataract of left eye 11/30/2020   Round hole of retina without detachment 11/30/2020   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   Uterovaginal prolapse 01/14/2020   Essential hypertension 01/14/2020   S/P right TKA 08/28/2017   S/P total knee replacement 08/28/2017    PCP: Melida Quitter, MD  REFERRING PROVIDER: Selmer Dominion, NP   REFERRING DIAG: N32.81 (ICD-10-CM) - Overactive bladder Z98.890 (ICD-10-CM) - Post-operative state N39.3 (ICD-10-CM) - SUI (stress urinary incontinence,  female) K59.09 (ICD-10-CM) - Other constipation  THERAPY DIAG:  Muscle weakness (generalized)  Unspecified lack of coordination  Rationale for Evaluation and Treatment: Rehabilitation  ONSET DATE: years  SUBJECTIVE:                                                                                                                                                                                           SUBJECTIVE STATEMENT: Pt reports has been traveling a lot, did have COVID and has been really tired due to this. With urine has had great improvement. Only one small instance since last visit ~1 month ago. No longer wearing pads but does still have urgency some. Returned to swimming, volleyball, and does HEP daily.   Fluid intake: Yes: water - ~20oz water, half cup coffee    PAIN:  Are you having pain? No   PRECAUTIONS: Other: hysterectomy in 4/24  WEIGHT BEARING RESTRICTIONS: No  FALLS:  Has patient fallen in last 6 months? No  LIVING ENVIRONMENT: Lives with: lives with their family Lives in: House/apartment  OCCUPATION: retired  PLOF: Independent  PATIENT GOALS: to have stronger pelvic floor and lessen risk for return of prolapse  PERTINENT HISTORY:  Surgery: s/p Robotic assisted lysis of adhesions, total laparoscopic hysterectomy, bilateral salpingo-oophorectomy, sacrocolpopexy [name], midurethral sling (Advantage Fit), cystoscopy on 03/23/23 Sexual abuse: No  BOWEL MOVEMENT: Pain with bowel movement: No Type of bowel movement:Type (Bristol Stool Scale) 3-4, Frequency somewhat more regularly, and Strain No Fully empty rectum: Yes:   Leakage: No Pads: No Fiber supplement: No - does use softeners   URINATION: Pain with urination: No Fully empty bladder: Yes:   Stream: Strong and Weak Urgency: No Frequency: not quicker than 2 hours, normal for her, 1-2x per night  Leakage:  not now, improved since surgery  Pads: Yes: AAT but just in  case  INTERCOURSE: Pain with intercourse:  not active Ability to have vaginal penetration:  Yes:   Climax: not painful Marinoff Scale: 0/3  PREGNANCY: Vaginal deliveries 0 Tearing No C-section deliveries 0 Currently pregnant No  PROLAPSE: Not since surgery    OBJECTIVE:   DIAGNOSTIC FINDINGS:    COGNITION: Overall cognitive status: Within functional limits for tasks assessed   SENSATION: Light touch: Appears intact Proprioception: Appears intact  MUSCLE LENGTH: Bil hamstrings and adductors limited by 25%   POSTURE: rounded shoulders, forward head, and posterior pelvic tilt  PELVIC ALIGNMENT: WFL  LUMBARAROM/PROM:  A/PROM A/PROM  eval  Flexion Limited by 25%  Extension WFL  Right lateral flexion Limited by 25%  Left lateral flexion Limited by 25%  Right rotation Limited by 25%  Left rotation Limited by 25%   (Blank rows = not tested)  LOWER EXTREMITY ROM:  WFL  LOWER EXTREMITY MMT: Bil Hip abduction 3/5, all other hips 4/5; knees 5/5 PALPATION:   General  no TTP at lumbar, glutes, abdominals but did have fascial restrictions                 External Perineal Exam no TTP                             Internal Pelvic Floor no TTP, slight tension but no pain and tissue mobility with very minimal mobility  Patient confirms identification and approves PT to assess internal pelvic floor and treatment Yes  PELVIC MMT:   MMT eval 08/08/23   Vaginal 1/5, unable to hold it 3/5, 2s, 2 reps  Internal Anal Sphincter    External Anal Sphincter    Puborectalis    Diastasis Recti    (Blank rows = not tested)        TONE: WFL  PROLAPSE: Not seen in hooklying   TODAY'S TREATMENT:                                                                                                                              DATE:   07/11/23: ITB stretch 2x30s each with gentle over pressure Lumbar rotations x10 each Seated ball squeezes 2x10 with pelvic floor  contractions 2x10 ball squeeze with bridge + pelvic floor contractions 2x10 Sit to stand first set body weight second set 5# + pelvic floor contractions Hip machine : flexion and abduction 2x10 25# Piriformis stretch 2x30s each  08/08/23:  Patient consented to internal pelvic floor treatment vaginally this date and found to have continued decreased strength, endurance, and coordination. Patient benefited from verbal cues for improved technique with pelvic floor contractions and coordination with breathing mechanics. Pt does have improvement compared to eval but weakness still present.  2x10 pelvic floor contractions - initially a 3/5 but decreased to 2/5 due to fatigue however better than eval  X10 quick flicks - slower speed but pt unable to complete this at eval X5 2-3s isometrics Pt did report tenderness throughout pelvic floor with gentle palpation but reports estrogen cream usually helps with the tight feeling and with coughing a lot with COVID reports feeling a little sore here.  PATIENT EDUCATION:  Education details: [id number] Person educated: Patient Education method: Explanation, Demonstration, Tactile cues, Verbal cues, and Handouts Education comprehension: verbalized understanding, returned demonstration, verbal cues required, tactile cues required, and needs further education  HOME EXERCISE PROGRAM: [id number]  ASSESSMENT:  CLINICAL IMPRESSION: Pt reports great improvement, pleased with this. Does want to feel more confident with leakage though she is seeing good improvement still fearful of leakage in community. Pt consented to internal vaginal treatment today and did demonstrate improvement with strength and endurance and coordination. Pt would benefit from additional PT to further address deficits.    OBJECTIVE IMPAIRMENTS: decreased coordination, decreased endurance, decreased mobility, decreased strength, increased fascial restrictions, impaired flexibility, improper body  mechanics, and postural dysfunction.   ACTIVITY LIMITATIONS: continence  PARTICIPATION LIMITATIONS: community activity  PERSONAL FACTORS: Time since onset of injury/illness/exacerbation are also affecting patient's functional outcome.   REHAB POTENTIAL: Good  CLINICAL DECISION MAKING: Stable/uncomplicated  EVALUATION COMPLEXITY: Low   GOALS: Goals reviewed with patient? Yes  SHORT TERM GOALS: Target date: 07/09/23  Pt to be I with HEP.  Baseline: Goal status: MET  2.  Pt to demonstrate improved coordination of pelvic floor and breathing mechanics with squat with appropriate synergistic patterns to decrease pain and leakage at least 50% of the time.    Baseline:  Goal status: MET  3.  Pt will have 25% less urgency due to bladder retraining and strengthening  Baseline:  Goal status: MET  4.  Pt to demonstrate at least 2/5 pelvic floor strength for improved pelvic stability and decreased strain at pelvic floor/ decrease leakage.  Baseline:  Goal status: on going   LONG TERM GOALS: Target date: 09/11/23  Pt to be I with advanced HEP.  Baseline:  Goal status: MET  2.  Pt to demonstrate improved coordination of pelvic floor and breathing mechanics with 10# squat with appropriate synergistic patterns to decrease pain and leakage at least 75% of the time.    Baseline:  Goal status: on going  3.  Pt will report her BMs are complete due to improved bowel habits and evacuation techniques.  Baseline:  Goal status: MET but with miralax   4.  Pt to demonstrate at least 4/5 pelvic floor strength for improved pelvic stability and decreased strain at pelvic floor/ decrease leakage.  Baseline:  Goal status: on going  5.  Pt to demonstrate at least 5/5 bil hip strength for improved pelvic stability and functional squats without leakage.  Baseline:  Goal status: on going  PLAN:  PT FREQUENCY: every other week  PT DURATION:  8 sessions  PLANNED INTERVENTIONS: Therapeutic  exercises, Therapeutic activity, Neuromuscular re-education, Patient/Family education, Self Care, Joint mobilization, DME instructions, Aquatic Therapy, Dry Needling, Electrical stimulation, Spinal mobilization, Cryotherapy, Moist heat, scar mobilization, Taping, Biofeedback, and Manual therapy  PLAN FOR NEXT SESSION: internal/biofeedback/ultrasound for feedback; breathing/voiding mechanics, breathing/pelvic floor/core coordination, stretching core/hips/pelvic floor, strengthening core/hips/back/pelvic floor, pressure management  Otelia Sergeant, PT, DPT 08/21/241:02 PM

## 2023-08-09 ENCOUNTER — Other Ambulatory Visit: Payer: Self-pay | Admitting: Internal Medicine

## 2023-08-09 DIAGNOSIS — Z1231 Encounter for screening mammogram for malignant neoplasm of breast: Secondary | ICD-10-CM

## 2023-08-22 ENCOUNTER — Ambulatory Visit: Payer: Medicare PPO | Attending: Obstetrics and Gynecology | Admitting: Physical Therapy

## 2023-08-22 DIAGNOSIS — M6281 Muscle weakness (generalized): Secondary | ICD-10-CM | POA: Insufficient documentation

## 2023-08-22 DIAGNOSIS — R293 Abnormal posture: Secondary | ICD-10-CM | POA: Diagnosis not present

## 2023-08-22 DIAGNOSIS — R279 Unspecified lack of coordination: Secondary | ICD-10-CM | POA: Diagnosis not present

## 2023-08-22 NOTE — Therapy (Signed)
OUTPATIENT PHYSICAL THERAPY FEMALE PELVIC TREATMENT   Patient Name: Victoria Wallace MRN: [id number] DOB:[birth year], 72 y.o., female Today's Date: 08/22/2023  END OF SESSION:  PT End of Session - 08/22/23 1359     Visit Number 4    Date for PT Re-Evaluation 09/11/23    Authorization Type humana MCR    PT Start Time 1400    PT Stop Time 1441    PT Time Calculation (min) 41 min    Activity Tolerance Patient tolerated treatment well    Behavior During Therapy [facility] for tasks assessed/performed             Past Medical History:  Diagnosis Date   Abnormal dexamethasone suppression test 06/2014   recieved info from patient   Arthritis    all over   Asymptomatic microscopic hematuria    monitored by PCP    Chronic constipation    uses Miralax   Ductal carcinoma in situ (DCIS) of right breast    Follows w/ Dr. Pamelia Hoit, oncology.   Encounter for hepatitis C screening test for low risk patient 05/18/2017   Flu vaccine 08/13/2017   History of colonoscopy 2012   Hypertension    Follows w/ PCP, Dorinda Hill, MD.   Hypothyroidism    Follows w/ PCP, Dorinda Hill, MD at [facility].   Ovarian cyst 2024   right   shingles vaccine 2011   recieved info from patient   Tetanus toxoid vaccination administered within the past year 05/18/2017   Uterine prolapse    Wears Pessirey   Wears glasses    reading only   Past Surgical History:  Procedure Laterality Date   BLADDER SUSPENSION N/A 03/23/2023   Procedure: TRANSVAGINAL TAPE (TVT) PROCEDURE (possible);  Surgeon: Marguerita Beards, MD;  Location: [facility];  Service: Gynecology;  Laterality: N/A;   BREAST BIOPSY Right    x 2   CYSTOSCOPY N/A 03/23/2023   Procedure: CYSTOSCOPY;  Surgeon: Marguerita Beards, MD;  Location: [facility];  Service: Gynecology;  Laterality: N/A;   ENDOMETRIAL BIOPSY  01/01/2023   benign   EYE SURGERY     Retinal laser surgery   LUMBAR LAMINECTOMY Bilateral 2002    MOUTH SURGERY     Dental and gum   TOTAL 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   Procedure: RIGHT TOTAL KNEE ARTHROPLASTY;  Surgeon: Durene Romans, MD;  Location: [facility];  Service: Orthopedics;  Laterality: Right;  70 mins   TOTAL MASTECTOMY Right 10/20/2021   Procedure: RIGHT TOTAL MASTECTOMY;  Surgeon: Emelia Loron, MD;  Location: [facility];  Service: General;  Laterality: Right;   XI ROBOTIC ASSISTED TOTAL HYSTERECTOMY WITH SACROCOLPOPEXY N/A 03/23/2023   Procedure: XI ROBOTIC ASSISTED TOTAL HYSTERECTOMY WITH BILATERAL SALPINGO-OOPHORECTOMY AND  SACROCOLPOPEXY/LYSIS OF ADHESIONS;  Surgeon: Marguerita Beards, MD;  Location: [facility];  Service: Gynecology;  Laterality: N/A;  Total time requested is 3.5hours   Patient Active Problem List   Diagnosis Date Noted   Vaginal prolapse 03/23/2023   Uterovaginal prolapse, incomplete 03/23/2023   Diverticular disease of colon 02/20/2022   Obesity 02/20/2022   Allergic rhinitis 02/20/2022   Posterior vitreous detachment of right eye 01/23/2022   Ductal carcinoma in situ (DCIS) of right breast 11/21/2021   S/P mastectomy, right 10/20/2021   Posterior vitreous detachment of left eye 11/30/2020   Vitreous floaters, left 11/30/2020   Round hole of right eye 11/30/2020   Vitreous floaters, right 11/30/2020   Nuclear sclerotic  cataract of right eye 11/30/2020   Nuclear sclerotic cataract of left eye 11/30/2020   Round hole of retina without detachment 11/30/2020   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   Uterovaginal prolapse 01/14/2020   Essential hypertension 01/14/2020   S/P right TKA 08/28/2017   S/P total knee replacement 08/28/2017    PCP: Melida Quitter, MD  REFERRING PROVIDER: Selmer Dominion, NP   REFERRING DIAG: N32.81 (ICD-10-CM) - Overactive bladder Z98.890 (ICD-10-CM) - Post-operative state N39.3 (ICD-10-CM) - SUI (stress urinary incontinence,  female) K59.09 (ICD-10-CM) - Other constipation  THERAPY DIAG:  Unspecified lack of coordination  Muscle weakness (generalized)  Abnormal posture  Rationale for Evaluation and Treatment: Rehabilitation  ONSET DATE: years  SUBJECTIVE:                                                                                                                                                                                           SUBJECTIVE STATEMENT: Rt hip pain has been still the same, limits walking a bit.  No pads for leakage, no longer having dribbling of urine. No having leakage overall now. Infrequent urgency.   Fluid intake: Yes: water - ~20oz water, half cup coffee    PAIN:  Are you having pain? No   PRECAUTIONS: Other: hysterectomy in 4/24  WEIGHT BEARING RESTRICTIONS: No  FALLS:  Has patient fallen in last 6 months? No  LIVING ENVIRONMENT: Lives with: lives with their family Lives in: House/apartment  OCCUPATION: retired  PLOF: Independent  PATIENT GOALS: to have stronger pelvic floor and lessen risk for return of prolapse  PERTINENT HISTORY:  Surgery: s/p Robotic assisted lysis of adhesions, total laparoscopic hysterectomy, bilateral salpingo-oophorectomy, sacrocolpopexy [name], midurethral sling ([employer]age Fit), cystoscopy on 03/23/23 Sexual abuse: No  BOWEL MOVEMENT: Pain with bowel movement: No Type of bowel movement:Type (Bristol Stool Scale) 3-4, Frequency somewhat more regularly, and Strain No Fully empty rectum: Yes:   Leakage: No Pads: No Fiber supplement: No - does use softeners   URINATION: Pain with urination: No Fully empty bladder: Yes:   Stream: Strong and Weak Urgency: No Frequency: not quicker than 2 hours, normal for her, 1-2x per night  Leakage:  not now, improved since surgery  Pads: Yes: AAT but just in case  INTERCOURSE: Pain with intercourse:  not active Ability to have vaginal penetration:  Yes:   Climax: not  painful Marinoff Scale: 0/3  PREGNANCY: Vaginal deliveries 0 Tearing No C-section deliveries 0 Currently pregnant No  PROLAPSE: Not since surgery    OBJECTIVE:   DIAGNOSTIC FINDINGS:    COGNITION: Overall cognitive status:  Within functional limits for tasks assessed   SENSATION: Light touch: Appears intact Proprioception: Appears intact  MUSCLE LENGTH: Bil hamstrings and adductors limited by 25%   POSTURE: rounded shoulders, forward head, and posterior pelvic tilt  PELVIC ALIGNMENT: WFL  LUMBARAROM/PROM:  A/PROM A/PROM  eval  Flexion Limited by 25%  Extension WFL  Right lateral flexion Limited by 25%  Left lateral flexion Limited by 25%  Right rotation Limited by 25%  Left rotation Limited by 25%   (Blank rows = not tested)  LOWER EXTREMITY ROM:  WFL  LOWER EXTREMITY MMT: Bil Hip abduction 3/5, all other hips 4/5; knees 5/5  PALPATION:   General  no TTP at lumbar, glutes, abdominals but did have fascial restrictions                 External Perineal Exam no TTP                             Internal Pelvic Floor no TTP, slight tension but no pain and tissue mobility with very minimal mobility  Patient confirms identification and approves PT to assess internal pelvic floor and treatment Yes  PELVIC MMT:   MMT eval 08/08/23   Vaginal 1/5, unable to hold it 3/5, 2s, 2 reps  Internal Anal Sphincter    External Anal Sphincter    Puborectalis    Diastasis Recti    (Blank rows = not tested)        TONE: WFL  PROLAPSE: Not seen in hooklying   TODAY'S TREATMENT:                                                                                                                              DATE:    08/22/23  Hooklying abduction red loop 2x10 Hooklying opp hand/knee ball press red loop 2x10 Quad fire hydrants x10 each 2X10 Sit to stand 10# Hip machine: abduction 40# 2x10  PATIENT EDUCATION:  Education details: [id number] Person educated:  Patient Education method: Explanation, Demonstration, Tactile cues, Verbal cues, and Handouts Education comprehension: verbalized understanding, returned demonstration, verbal cues required, tactile cues required, and needs further education  HOME EXERCISE PROGRAM: [id number]  ASSESSMENT:  CLINICAL IMPRESSION: Pt reports great improvement, pleased with this. Session focused on coordination  of pelvic floor with all exercises for pelvic floor and hip strength. Pt would benefit from additional PT to further address deficits.    OBJECTIVE IMPAIRMENTS: decreased coordination, decreased endurance, decreased mobility, decreased strength, increased fascial restrictions, impaired flexibility, improper body mechanics, and postural dysfunction.   ACTIVITY LIMITATIONS: continence  PARTICIPATION LIMITATIONS: community activity  PERSONAL FACTORS: Time since onset of injury/illness/exacerbation are also affecting patient's functional outcome.   REHAB POTENTIAL: Good  CLINICAL DECISION MAKING: Stable/uncomplicated  EVALUATION COMPLEXITY: Low   GOALS: Goals reviewed with patient? Yes  SHORT TERM GOALS: Target date: 07/09/23  Pt to be I with HEP.  Baseline: Goal  status: MET  2.  Pt to demonstrate improved coordination of pelvic floor and breathing mechanics with squat with appropriate synergistic patterns to decrease pain and leakage at least 50% of the time.    Baseline:  Goal status: MET  3.  Pt will have 25% less urgency due to bladder retraining and strengthening  Baseline:  Goal status: MET  4.  Pt to demonstrate at least 2/5 pelvic floor strength for improved pelvic stability and decreased strain at pelvic floor/ decrease leakage.  Baseline:  Goal status: MET   LONG TERM GOALS: Target date: 09/11/23  Pt to be I with advanced HEP.  Baseline:  Goal status: MET  2.  Pt to demonstrate improved coordination of pelvic floor and breathing mechanics with 10# squat with appropriate  synergistic patterns to decrease pain and leakage at least 75% of the time.    Baseline:  Goal status: MET  3.  Pt will report her BMs are complete due to improved bowel habits and evacuation techniques.  Baseline:  Goal status: MET but with miralax   4.  Pt to demonstrate at least 4/5 pelvic floor strength for improved pelvic stability and decreased strain at pelvic floor/ decrease leakage.  Baseline:  Goal status: on going  5.  Pt to demonstrate at least 5/5 bil hip strength for improved pelvic stability and functional squats without leakage.  Baseline:  Goal status: on going  PLAN:  PT FREQUENCY: every other week  PT DURATION:  8 sessions  PLANNED INTERVENTIONS: Therapeutic exercises, Therapeutic activity, Neuromuscular re-education, Patient/Family education, Self Care, Joint mobilization, DME instructions, Aquatic Therapy, Dry Needling, Electrical stimulation, Spinal mobilization, Cryotherapy, Moist heat, scar mobilization, Taping, Biofeedback, and Manual therapy  PLAN FOR NEXT SESSION: internal/biofeedback/ultrasound for feedback; breathing/voiding mechanics, breathing/pelvic floor/core coordination, stretching core/hips/pelvic floor, strengthening core/hips/back/pelvic floor, pressure management  Otelia Sergeant, PT, DPT 09/04/242:45 PM

## 2023-08-27 NOTE — Progress Notes (Signed)
Patient Care Team: Melida Quitter, MD as PCP - General (Internal Medicine) Serena Croissant, MD as Consulting Physician (Hematology and Oncology) Emelia Loron, MD as Consulting Physician (General Surgery)  DIAGNOSIS: No diagnosis found.  SUMMARY OF ONCOLOGIC HISTORY: Oncology History  Ductal carcinoma in situ (DCIS) of right breast  09/15/2019 Initial Diagnosis   Right breast biopsy upper outer quadrant: DCIS with necrosis high-grade, upper outer posterior: DCIS involving a complex sclerosing lesion, ER 100%, PR 60 to 80%   09/14/2021 Cancer Staging   Staging form: Breast, AJCC 8th Edition - Clinical stage from 09/14/2021: Stage 0 (cTis (DCIS), cN0, cM0, ER+, PR+) - Signed by Loa Socks, NP on 02/18/2022 Stage prefix: Initial diagnosis Nuclear grade: G3   10/20/2021 Surgery   Right mastectomy: High-grade DCIS 2.2 cm, margins negative, ER 100%, PR 60-80%   10/20/2021 Cancer Staging   Staging form: Breast, AJCC 8th Edition - Pathologic stage from 10/20/2021: Stage 0 (pTis (DCIS), pN0, cM0, ER+, PR+) - Signed by Loa Socks, NP on 02/18/2022 Stage prefix: Initial diagnosis Nuclear grade: G3   11/2021 -  Anti-estrogen oral therapy   Tamoxifen daily     CHIEF COMPLIANT: Follow-up breast cancer on tamoxifen   INTERVAL HISTORY: Victoria Wallace is a 72 y.o. female is here because of recent diagnosis of DCIS of the right breast. She presents to the clinic today for a follow-up.    ALLERGIES:  is allergic to codeine, penicillins, and tape.  MEDICATIONS:  Current Outpatient Medications  Medication Sig Dispense Refill   cholecalciferol (VITAMIN D3) 25 MCG (1000 UNIT) tablet Take 1,000 Units by mouth daily.     clobetasol ointment (TEMOVATE) 0.05 % APPLY THIN LAYER TOPICALLY TO THE AFFECTED AREA TWICE DAILY     estradiol (ESTRACE) 0.1 MG/GM vaginal cream Place 1 Applicatorful vaginally 2 (two) times a week.     estradiol (ESTRACE) 0.1 MG/GM vaginal cream Place  0.5g nightly twice a week 30 g 11   Estradiol 10 MCG TABS vaginal tablet Place vaginally once a week.     hydrochlorothiazide (HYDRODIURIL) 25 MG tablet Take 25 mg by mouth daily.     ibuprofen (ADVIL) 600 MG tablet Take 1 tablet (600 mg total) by mouth every 6 (six) hours as needed. 30 tablet 0   levothyroxine (SYNTHROID) 25 MCG tablet Take 1 tablet (25 mcg total) by mouth daily before breakfast.     lisinopril (PRINIVIL,ZESTRIL) 40 MG tablet Take 40 mg by mouth daily.     metoprolol succinate (TOPROL-XL) 100 MG 24 hr tablet Take 100 mg by mouth daily.  0   Multiple Vitamin (MULTIVITAMIN WITH MINERALS) TABS tablet Take 1 tablet by mouth daily. Pt takes three times a week.     polyethylene glycol powder (GLYCOLAX/MIRALAX) 17 GM/SCOOP powder Take 17 g by mouth daily. Drink 17g (1 scoop) dissolved in water per day. 255 g 0   tamoxifen (NOLVADEX) 20 MG tablet Take 1 tablet (20 mg total) by mouth daily. 90 tablet 3   Trospium Chloride 60 MG CP24 Take 1 capsule (60 mg total) by mouth daily. 30 capsule 5   No current facility-administered medications for this visit.    PHYSICAL EXAMINATION: ECOG PERFORMANCE STATUS: {CHL ONC ECOG PS:[phone]}  There were no vitals filed for this visit. There were no vitals filed for this visit.  BREAST:*** No palpable masses or nodules in either right or left breasts. No palpable axillary supraclavicular or infraclavicular adenopathy no breast tenderness or nipple discharge. (exam performed in  the presence of a chaperone)  LABORATORY DATA:  I have reviewed the data as listed    Latest Ref Rng & Units 03/21/2023   10:17 AM 10/12/2021    5:55 PM 08/29/2017    5:31 AM  CMP  Glucose 70 - 99 mg/dL 671  245  809   BUN 8 - 23 mg/dL 20  20  14    Creatinine 0.44 - 1.00 mg/dL 9.83  3.82  5.05   Sodium 135 - 145 mmol/L 138  136  138   Potassium 3.5 - 5.1 mmol/L 3.9  4.0  3.6   Chloride 98 - 111 mmol/L 105  99  106   CO2 22 - 32 mmol/L 27  28  27    Calcium 8.9 -  10.3 mg/dL 9.2  39.7  8.7     Lab Results  Component Value Date   WBC 5.0 03/21/2023   HGB 11.3 (L) 03/21/2023   HCT 35.0 (L) 03/21/2023   MCV 98.9 03/21/2023   PLT 204 03/21/2023    ASSESSMENT & PLAN:  No problem-specific Assessment & Plan notes found for this encounter.    No orders of the defined types were placed in this encounter.  The patient has a good understanding of the overall plan. she agrees with it. she will call with any problems that may develop before the next visit here. Total time spent: 30 mins including face to face time and time spent for planning, charting and co-ordination of care   Sherlyn Lick, CMA 08/27/23    I Janan Ridge am acting as a Neurosurgeon for [employer]  ***

## 2023-08-30 ENCOUNTER — Inpatient Hospital Stay: Payer: Medicare PPO | Attending: Hematology and Oncology | Admitting: Hematology and Oncology

## 2023-08-30 VITALS — BP 144/49 | HR 90 | Temp 97.9°F | Resp 18 | Ht 70.0 in | Wt 217.4 lb

## 2023-08-30 DIAGNOSIS — D0511 Intraductal carcinoma in situ of right breast: Secondary | ICD-10-CM | POA: Insufficient documentation

## 2023-08-30 DIAGNOSIS — Z79810 Long term (current) use of selective estrogen receptor modulators (SERMs): Secondary | ICD-10-CM | POA: Insufficient documentation

## 2023-08-30 DIAGNOSIS — Z9011 Acquired absence of right breast and nipple: Secondary | ICD-10-CM | POA: Diagnosis not present

## 2023-08-30 MED ORDER — CYANOCOBALAMIN 250 MCG PO TABS: 250.0000 ug | ORAL_TABLET | Freq: Every day | ORAL | Status: AC

## 2023-08-30 MED ORDER — TAMOXIFEN CITRATE 20 MG PO TABS: 20.0000 mg | ORAL_TABLET | Freq: Every day | ORAL | 3 refills | Status: DC

## 2023-08-30 MED ORDER — MAGNESIUM 200 MG PO CHEW: 1.0000 | CHEWABLE_TABLET | Freq: Every day | ORAL | Status: DC

## 2023-08-30 NOTE — Assessment & Plan Note (Addendum)
10/20/2021 right mastectomy: High-grade DCIS 2.2 cm, margins negative, ER 100%, PR 60-80% Current treatment: Tamoxifen 20 mg daily x5 years started December 2022  Tamoxifen toxicities: she is tolerating it extremely well without any problems or concerns. She underwent total abdominal hysterectomy April 2024 for endometrial hypertrophy.   Breast cancer surveillance: 1.  Breast exam 08/30/2023 Benign 2. left breast mammogram scheduled for 09/24/2023   Return to clinic in 1 year for follow-up

## 2023-09-05 ENCOUNTER — Ambulatory Visit: Payer: Medicare PPO | Admitting: Physical Therapy

## 2023-09-05 DIAGNOSIS — M6281 Muscle weakness (generalized): Secondary | ICD-10-CM | POA: Diagnosis not present

## 2023-09-05 DIAGNOSIS — R279 Unspecified lack of coordination: Secondary | ICD-10-CM

## 2023-09-05 DIAGNOSIS — R293 Abnormal posture: Secondary | ICD-10-CM | POA: Diagnosis not present

## 2023-09-05 NOTE — Therapy (Signed)
OUTPATIENT PHYSICAL THERAPY FEMALE PELVIC TREATMENT   Patient Name: Victoria Wallace MRN: [id number] DOB:[birth year], 72 y.o., female Today's Date: 09/05/2023  END OF SESSION:  PT End of Session - 09/05/23 1404     Visit Number 5    Date for PT Re-Evaluation 09/11/23    Authorization Type humana MCR    PT Start Time 1400    PT Stop Time 1443    PT Time Calculation (min) 43 min    Activity Tolerance Patient tolerated treatment well    Behavior During Therapy [facility] for tasks assessed/performed             Past Medical History:  Diagnosis Date   Abnormal dexamethasone suppression test 06/2014   recieved info from patient   Arthritis    all over   Asymptomatic microscopic hematuria    monitored by PCP    Chronic constipation    uses Miralax   Ductal carcinoma in situ (DCIS) of right breast    Follows w/ Dr. Pamelia Hoit, oncology.   Encounter for hepatitis C screening test for low risk patient 05/18/2017   Flu vaccine 08/13/2017   History of colonoscopy 2012   Hypertension    Follows w/ PCP, Dorinda Hill, MD.   Hypothyroidism    Follows w/ PCP, Dorinda Hill, MD at [facility].   Ovarian cyst 2024   right   shingles vaccine 2011   recieved info from patient   Tetanus toxoid vaccination administered within the past year 05/18/2017   Uterine prolapse    Wears Pessirey   Wears glasses    reading only   Past Surgical History:  Procedure Laterality Date   BLADDER SUSPENSION N/A 03/23/2023   Procedure: TRANSVAGINAL TAPE (TVT) PROCEDURE (possible);  Surgeon: Marguerita Beards, MD;  Location: [facility];  Service: Gynecology;  Laterality: N/A;   BREAST BIOPSY Right    x 2   CYSTOSCOPY N/A 03/23/2023   Procedure: CYSTOSCOPY;  Surgeon: Marguerita Beards, MD;  Location: [facility] ([place]  Service: Gynecology;  Laterality: N/A;   ENDOMETRIAL BIOPSY  01/01/2023   benign   EYE SURGERY     Retinal laser surgery   LUMBAR LAMINECTOMY Bilateral 2002    MOUTH SURGERY     Dental and gum   TOTAL 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   Procedure: RIGHT TOTAL KNEE ARTHROPLASTY;  Surgeon: Durene Romans, MD;  Location: [facility];  Service: Orthopedics;  Laterality: Right;  70 mins   TOTAL MASTECTOMY Right 10/20/2021   Procedure: RIGHT TOTAL MASTECTOMY;  Surgeon: Emelia Loron, MD;  Location: [facility];  Service: General;  Laterality: Right;   XI ROBOTIC ASSISTED TOTAL HYSTERECTOMY WITH SACROCOLPOPEXY N/A 03/23/2023   Procedure: XI ROBOTIC ASSISTED TOTAL HYSTERECTOMY WITH BILATERAL SALPINGO-OOPHORECTOMY AND  SACROCOLPOPEXY/LYSIS OF ADHESIONS;  Surgeon: Marguerita Beards, MD;  Location: [facility];  Service: Gynecology;  Laterality: N/A;  Total time requested is 3.5hours   Patient Active Problem List   Diagnosis Date Noted   Vaginal prolapse 03/23/2023   Uterovaginal prolapse, incomplete 03/23/2023   Diverticular disease of colon 02/20/2022   Obesity 02/20/2022   Allergic rhinitis 02/20/2022   Posterior vitreous detachment of right eye 01/23/2022   Ductal carcinoma in situ (DCIS) of right breast 11/21/2021   S/P mastectomy, right 10/20/2021   Posterior vitreous detachment of left eye 11/30/2020   Vitreous floaters, left 11/30/2020   Round hole of right eye 11/30/2020   Vitreous floaters, right 11/30/2020   Nuclear sclerotic  cataract of right eye 11/30/2020   Nuclear sclerotic cataract of left eye 11/30/2020   Round hole of retina without detachment 11/30/2020   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   Uterovaginal prolapse 01/14/2020   Essential hypertension 01/14/2020   S/P right TKA 08/28/2017   S/P total knee replacement 08/28/2017    PCP: Melida Quitter, MD  REFERRING PROVIDER: Selmer Dominion, NP   REFERRING DIAG: N32.81 (ICD-10-CM) - Overactive bladder Z98.890 (ICD-10-CM) - Post-operative state N39.3 (ICD-10-CM) - SUI (stress urinary incontinence,  female) K59.09 (ICD-10-CM) - Other constipation  THERAPY DIAG:  Unspecified lack of coordination  Muscle weakness (generalized)  Rationale for Evaluation and Treatment: Rehabilitation  ONSET DATE: years  SUBJECTIVE:                                                                                                                                                                                           SUBJECTIVE STATEMENT: Pt reports I feel 80% better since better. Does still have occasional urgency but minimally with waiting a few hours between urine. I want to be more consistent with doing my exercises but I have been doing them.   Fluid intake: Yes: water - ~20oz water, half cup coffee    PAIN:  Are you having pain? No   PRECAUTIONS: Other: hysterectomy in 4/24  WEIGHT BEARING RESTRICTIONS: No  FALLS:  Has patient fallen in last 6 months? No  LIVING ENVIRONMENT: Lives with: lives with their family Lives in: House/apartment  OCCUPATION: retired  PLOF: Independent  PATIENT GOALS: to have stronger pelvic floor and lessen risk for return of prolapse  PERTINENT HISTORY:  Surgery: s/p Robotic assisted lysis of adhesions, total laparoscopic hysterectomy, bilateral salpingo-oophorectomy, sacrocolpopexy [name], midurethral sling (Advantage Fit), cystoscopy on 03/23/23 Sexual abuse: No  BOWEL MOVEMENT: Pain with bowel movement: No Type of bowel movement:Type (Bristol Stool Scale) 3-4, Frequency somewhat more regularly, and Strain No Fully empty rectum: Yes:   Leakage: No Pads: No Fiber supplement: No - does use softeners   URINATION: Pain with urination: No Fully empty bladder: Yes:   Stream: Strong and Weak Urgency: No Frequency: not quicker than 2 hours, normal for her, 1-2x per night  Leakage:  not now, improved since surgery  Pads: Yes: AAT but just in case  INTERCOURSE: Pain with intercourse:  not active Ability to have vaginal penetration:  Yes:    Climax: not painful Marinoff Scale: 0/3  PREGNANCY: Vaginal deliveries 0 Tearing No C-section deliveries 0 Currently pregnant No  PROLAPSE: Not since surgery    OBJECTIVE:   DIAGNOSTIC FINDINGS:  COGNITION: Overall cognitive status: Within functional limits for tasks assessed   SENSATION: Light touch: Appears intact Proprioception: Appears intact  MUSCLE LENGTH: Bil hamstrings and adductors limited by 25%   POSTURE: rounded shoulders, forward head, and posterior pelvic tilt  PELVIC ALIGNMENT: WFL  LUMBARAROM/PROM:  A/PROM A/PROM  eval  Flexion Limited by 25%  Extension WFL  Right lateral flexion Limited by 25%  Left lateral flexion Limited by 25%  Right rotation Limited by 25%  Left rotation Limited by 25%   (Blank rows = not tested)  LOWER EXTREMITY ROM:  WFL  LOWER EXTREMITY MMT: Bil Hip abduction 3/5, all other hips 4/5; knees 5/5  PALPATION:   General  no TTP at lumbar, glutes, abdominals but did have fascial restrictions                 External Perineal Exam no TTP                             Internal Pelvic Floor no TTP, slight tension but no pain and tissue mobility with very minimal mobility  Patient confirms identification and approves PT to assess internal pelvic floor and treatment Yes  PELVIC MMT:   MMT eval 08/08/23  09/05/23   Vaginal 1/5, unable to hold it 3/5, 2s, 2 reps 3/5, 2s, 3 reps  Internal Anal Sphincter     External Anal Sphincter     Puborectalis     Diastasis Recti     (Blank rows = not tested)        TONE: WFL  PROLAPSE: Not seen in hooklying 09/05/23  not seen in hooklying   TODAY'S TREATMENT:                                                                                                                              DATE:   09/05/23: Patient consented to internal pelvic floor reassessment vaginally this date and found to have improved findings compared to evaluation. No vaginal wall laxity with cough in  hooklying, similar findings to previous internal session. Pt does have difficulty with contraction mechanics and does benefit from minimal cues to initiate proper technique however no bulging noted. 2x10 contractions, x10 quick flicks, and x5 attempted 5s isometrics however for 3/5 strength unable to reach 5s.  After internal pt educated on vaginal weights as she expressed desire to do more at home post discharge for continued strengthening. Pt denied questions and reports understanding.    PATIENT EDUCATION:  Education details: [id number] Person educated: Patient Education method: Explanation, Demonstration, Tactile cues, Verbal cues, and Handouts Education comprehension: verbalized understanding, returned demonstration, verbal cues required, tactile cues required, and needs further education  HOME EXERCISE PROGRAM: [id number]  ASSESSMENT:  CLINICAL IMPRESSION: Pt reports continued improvement, pleased with this. Requests and agreeable for discharge today. Reviewed goals and symptoms and all improved with minor urgency noted sometimes but not consistent now.  Pt has met all goals except internal strength goal as it is 3/5 and goal for 4/5. Pt educated on this and reports she understands and motivated to continue to improve on her own but states she feels confident with continuing HEP and education given during PT. Today will serve as DC post treatment and pt understands she will need new referral for future PT needs.    OBJECTIVE IMPAIRMENTS: decreased coordination, decreased endurance, decreased mobility, decreased strength, increased fascial restrictions, impaired flexibility, improper body mechanics, and postural dysfunction.   ACTIVITY LIMITATIONS: continence  PARTICIPATION LIMITATIONS: community activity  PERSONAL FACTORS: Time since onset of injury/illness/exacerbation are also affecting patient's functional outcome.   REHAB POTENTIAL: Good  CLINICAL DECISION MAKING:  Stable/uncomplicated  EVALUATION COMPLEXITY: Low   GOALS: Goals reviewed with patient? Yes  SHORT TERM GOALS: Target date: 07/09/23  Pt to be I with HEP.  Baseline: Goal status: MET  2.  Pt to demonstrate improved coordination of pelvic floor and breathing mechanics with squat with appropriate synergistic patterns to decrease pain and leakage at least 50% of the time.    Baseline:  Goal status: MET  3.  Pt will have 25% less urgency due to bladder retraining and strengthening  Baseline:  Goal status: MET  4.  Pt to demonstrate at least 2/5 pelvic floor strength for improved pelvic stability and decreased strain at pelvic floor/ decrease leakage.  Baseline:  Goal status: MET   LONG TERM GOALS: Target date: 09/11/23  Pt to be I with advanced HEP.  Baseline:  Goal status: MET  2.  Pt to demonstrate improved coordination of pelvic floor and breathing mechanics with 10# squat with appropriate synergistic patterns to decrease pain and leakage at least 75% of the time.    Baseline:  Goal status: MET  3.  Pt will report her BMs are complete due to improved bowel habits and evacuation techniques.  Baseline:  Goal status: MET but with miralax   4.  Pt to demonstrate at least 4/5 pelvic floor strength for improved pelvic stability and decreased strain at pelvic floor/ decrease leakage.  Baseline:  Goal status: not met 3/5  5.  Pt to demonstrate at least 5/5 bil hip strength for improved pelvic stability and functional squats without leakage.  Baseline:  Goal status: MET  PLAN:  PT FREQUENCY: every other week  PT DURATION:  8 sessions  PLANNED INTERVENTIONS: Therapeutic exercises, Therapeutic activity, Neuromuscular re-education, Patient/Family education, Self Care, Joint mobilization, DME instructions, Aquatic Therapy, Dry Needling, Electrical stimulation, Spinal mobilization, Cryotherapy, Moist heat, scar mobilization, Taping, Biofeedback, and Manual therapy  PLAN FOR  NEXT SESSION:   PHYSICAL THERAPY DISCHARGE SUMMARY  Visits from Start of Care: 5  Current functional level related to goals / functional outcomes: All STG met, all LTG met except internal pelvic floor strength pt currently 3/5 and goal was for 4/5   Remaining deficits: Does have mild urgency sometimes but not bothered by this and reports she feels much better about it now than prior to starting PT.    Education / Equipment: HEP   Patient agrees to discharge. Patient goals were partially met. Patient is being discharged due to being pleased with the current functional level.   Otelia Sergeant, PT, DPT 09/18/243:21 PM

## 2023-09-19 ENCOUNTER — Encounter: Payer: Medicare PPO | Admitting: Physical Therapy

## 2023-09-24 ENCOUNTER — Ambulatory Visit
Admission: RE | Admit: 2023-09-24 | Discharge: 2023-09-24 | Disposition: A | Discharge status: Home / Self Care | Payer: Medicare PPO | Source: Ambulatory Visit | Attending: Internal Medicine | Admitting: Internal Medicine

## 2023-09-24 DIAGNOSIS — Z1231 Encounter for screening mammogram for malignant neoplasm of breast: Secondary | ICD-10-CM

## 2023-10-03 ENCOUNTER — Encounter: Payer: Medicare PPO | Admitting: Physical Therapy

## 2023-10-17 ENCOUNTER — Encounter: Payer: Medicare PPO | Admitting: Physical Therapy

## 2023-11-07 DIAGNOSIS — M25552 Pain in left hip: Secondary | ICD-10-CM | POA: Diagnosis not present

## 2023-11-10 DIAGNOSIS — R350 Frequency of micturition: Secondary | ICD-10-CM | POA: Diagnosis not present

## 2023-11-10 DIAGNOSIS — R300 Dysuria: Secondary | ICD-10-CM | POA: Diagnosis not present

## 2023-11-13 DIAGNOSIS — H5213 Myopia, bilateral: Secondary | ICD-10-CM | POA: Diagnosis not present

## 2023-11-13 DIAGNOSIS — D3131 Benign neoplasm of right choroid: Secondary | ICD-10-CM | POA: Diagnosis not present

## 2023-11-13 DIAGNOSIS — H40013 Open angle with borderline findings, low risk, bilateral: Secondary | ICD-10-CM | POA: Diagnosis not present

## 2023-11-13 DIAGNOSIS — H2513 Age-related nuclear cataract, bilateral: Secondary | ICD-10-CM | POA: Diagnosis not present

## 2023-11-14 DIAGNOSIS — M25552 Pain in left hip: Secondary | ICD-10-CM | POA: Diagnosis not present

## 2023-11-14 DIAGNOSIS — R2681 Unsteadiness on feet: Secondary | ICD-10-CM | POA: Diagnosis not present

## 2023-11-14 DIAGNOSIS — R2689 Other abnormalities of gait and mobility: Secondary | ICD-10-CM | POA: Diagnosis not present

## 2023-11-21 DIAGNOSIS — M25552 Pain in left hip: Secondary | ICD-10-CM | POA: Diagnosis not present

## 2023-11-21 DIAGNOSIS — R2689 Other abnormalities of gait and mobility: Secondary | ICD-10-CM | POA: Diagnosis not present

## 2023-11-21 DIAGNOSIS — M6281 Muscle weakness (generalized): Secondary | ICD-10-CM | POA: Diagnosis not present

## 2023-11-26 DIAGNOSIS — M6281 Muscle weakness (generalized): Secondary | ICD-10-CM | POA: Diagnosis not present

## 2023-11-26 DIAGNOSIS — M25552 Pain in left hip: Secondary | ICD-10-CM | POA: Diagnosis not present

## 2023-11-26 DIAGNOSIS — R2689 Other abnormalities of gait and mobility: Secondary | ICD-10-CM | POA: Diagnosis not present

## 2023-11-29 DIAGNOSIS — M25552 Pain in left hip: Secondary | ICD-10-CM | POA: Diagnosis not present

## 2023-11-29 DIAGNOSIS — M6281 Muscle weakness (generalized): Secondary | ICD-10-CM | POA: Diagnosis not present

## 2023-11-29 DIAGNOSIS — R2689 Other abnormalities of gait and mobility: Secondary | ICD-10-CM | POA: Diagnosis not present

## 2023-12-06 DIAGNOSIS — R2689 Other abnormalities of gait and mobility: Secondary | ICD-10-CM | POA: Diagnosis not present

## 2023-12-06 DIAGNOSIS — M25552 Pain in left hip: Secondary | ICD-10-CM | POA: Diagnosis not present

## 2023-12-06 DIAGNOSIS — M6281 Muscle weakness (generalized): Secondary | ICD-10-CM | POA: Diagnosis not present

## 2023-12-10 DIAGNOSIS — R2689 Other abnormalities of gait and mobility: Secondary | ICD-10-CM | POA: Diagnosis not present

## 2023-12-10 DIAGNOSIS — M6281 Muscle weakness (generalized): Secondary | ICD-10-CM | POA: Diagnosis not present

## 2023-12-10 DIAGNOSIS — M25552 Pain in left hip: Secondary | ICD-10-CM | POA: Diagnosis not present

## 2023-12-13 DIAGNOSIS — M6281 Muscle weakness (generalized): Secondary | ICD-10-CM | POA: Diagnosis not present

## 2023-12-13 DIAGNOSIS — M25552 Pain in left hip: Secondary | ICD-10-CM | POA: Diagnosis not present

## 2023-12-13 DIAGNOSIS — R2689 Other abnormalities of gait and mobility: Secondary | ICD-10-CM | POA: Diagnosis not present

## 2023-12-17 DIAGNOSIS — M25552 Pain in left hip: Secondary | ICD-10-CM | POA: Diagnosis not present

## 2023-12-17 DIAGNOSIS — R2689 Other abnormalities of gait and mobility: Secondary | ICD-10-CM | POA: Diagnosis not present

## 2023-12-17 DIAGNOSIS — M6281 Muscle weakness (generalized): Secondary | ICD-10-CM | POA: Diagnosis not present

## 2024-01-01 DIAGNOSIS — M25552 Pain in left hip: Secondary | ICD-10-CM | POA: Diagnosis not present

## 2024-02-14 ENCOUNTER — Other Ambulatory Visit (HOSPITAL_COMMUNITY)
Admission: RE | Admit: 2024-02-14 | Discharge: 2024-02-14 | Disposition: A | Discharge status: Home / Self Care | Payer: Medicare PPO | Source: Ambulatory Visit | Attending: Obstetrics and Gynecology | Admitting: Obstetrics and Gynecology

## 2024-02-14 ENCOUNTER — Encounter: Payer: Self-pay | Admitting: Obstetrics and Gynecology

## 2024-02-14 ENCOUNTER — Ambulatory Visit: Payer: Medicare PPO | Admitting: Obstetrics and Gynecology

## 2024-02-14 VITALS — BP 120/76 | HR 81

## 2024-02-14 DIAGNOSIS — R319 Hematuria, unspecified: Secondary | ICD-10-CM

## 2024-02-14 DIAGNOSIS — R82998 Other abnormal findings in urine: Secondary | ICD-10-CM | POA: Insufficient documentation

## 2024-02-14 DIAGNOSIS — N952 Postmenopausal atrophic vaginitis: Secondary | ICD-10-CM

## 2024-02-14 DIAGNOSIS — L900 Lichen sclerosus et atrophicus: Secondary | ICD-10-CM | POA: Diagnosis not present

## 2024-02-14 DIAGNOSIS — N8110 Cystocele, unspecified: Secondary | ICD-10-CM

## 2024-02-14 DIAGNOSIS — R350 Frequency of micturition: Secondary | ICD-10-CM

## 2024-02-14 LAB — POCT URINALYSIS DIPSTICK
Bilirubin, UA: NEGATIVE
Glucose, UA: NEGATIVE
Ketones, UA: NEGATIVE
Nitrite, UA: NEGATIVE
Protein, UA: NEGATIVE
Spec Grav, UA: 1.02 (ref 1.010–1.025)
Urobilinogen, UA: 0.2 U/dL
pH, UA: 7 (ref 5.0–8.0)

## 2024-02-14 LAB — URINALYSIS, ROUTINE W REFLEX MICROSCOPIC
Bilirubin Urine: NEGATIVE
Glucose, UA: NEGATIVE mg/dL
Ketones, ur: NEGATIVE mg/dL
Nitrite: NEGATIVE
Protein, ur: NEGATIVE mg/dL
Specific Gravity, Urine: 1.017 (ref 1.005–1.030)
WBC, UA: >50 WBC/hpf (ref 0–5)
pH: 6 (ref 5.0–8.0)

## 2024-02-14 MED ORDER — NITROFURANTOIN MONOHYD MACRO 100 MG PO CAPS: 100.0000 mg | ORAL_CAPSULE | Freq: Two times a day (BID) | ORAL | 1 refills | Status: AC

## 2024-02-14 NOTE — Progress Notes (Signed)
 [facility] Return Visit  SUBJECTIVE  History of Present Illness: Victoria Wallace is a 73 y.o. female seen in follow-up for possible UTI, feeling as if there is a possible bulge returning, and also wanting to discuss pelvic floor muscle use.     Past Medical History: Patient  has a past medical history of Abnormal dexamethasone suppression test (06/2014), Arthritis, Asymptomatic microscopic hematuria, Chronic constipation, Ductal carcinoma in situ (DCIS) of right breast, Encounter for hepatitis C screening test for low risk patient (05/18/2017), Flu vaccine (08/13/2017), History of colonoscopy (2012), Hypertension, Hypothyroidism, Ovarian cyst (2024), shingles vaccine (2011), Tetanus toxoid vaccination administered within the past year (05/18/2017), Uterine prolapse, and Wears glasses.   Past Surgical History: She  has a past surgical history that includes Lumbar laminectomy (Bilateral, 2002); Mouth surgery; Eye surgery; Total knee arthroplasty (Right, 08/28/2017); Breast biopsy (Right); Total mastectomy (Right, 10/20/2021); Endometrial biopsy (01/01/2023); Xi robotic assisted total hysterectomy with sacrocolpopexy (N/A, 03/23/2023); Bladder suspension (N/A, 03/23/2023); and Cystoscopy (N/A, 03/23/2023).   Medications: She has a current medication list which includes the following prescription(s): cholecalciferol, clobetasol ointment, cyanocobalamin, estradiol, hydrochlorothiazide, levothyroxine, lisinopril, magnesium, metoprolol succinate, multivitamin with minerals, nitrofurantoin (macrocrystal-monohydrate), polyethylene glycol powder, and tamoxifen.   Allergies: Patient is allergic to codeine, penicillins, and tape.   Social History: Patient  reports that she has never smoked. She has never used smokeless tobacco. She reports current alcohol use of about 1.0 standard drink of alcohol per week. She reports that she does not use drugs.     OBJECTIVE    Lab Results  Component  Value Date   COLORU yellow 02/14/2024   CLARITYU slight cloudy 02/14/2024   GLUCOSEUR Negative 02/14/2024   BILIRUBINUR negative 02/14/2024   KETONESU negative 02/14/2024   SPECGRAV 1.020 02/14/2024   RBCUR moderate 02/14/2024   PHUR 7.0 02/14/2024   PROTEINUR Negative 02/14/2024   UROBILINOGEN 0.2 02/14/2024   LEUKOCYTESUR Small (1+) (A) 02/14/2024     Physical Exam: Vitals:   02/14/24 0831  BP: 120/76  Pulse: 81   Gen: No apparent distress, A&O x 3.  Detailed Urogynecologic Evaluation:   External genitalia shows an increase in Lichen's irritation. She reports she has clobetasol cream to use. Anterior vaginal wall was dropping a small amount with valsalva. Patient reports she could feel this when doing her vaginal estrogen cream.   Patient brought with her vaginal weights today. We practiced how to insert, pull up with a kegel, and do a reverse kegel to remove. We discussed using a silicon based lubricant to insert and using only a small amount as it can be slippery.    POP-Q  -2                                            Aa   -2                                           Ba  -7                                              C   3  Gh  4.5                                            Pb  7.5                                            tvl   -3                                            Ap  -3                                            Bp                                                 D        ASSESSMENT AND PLAN    Ms. Bauer is a 73 y.o. with:  1. Urinary frequency   2. Leukocytes in urine   3. Hematuria, unspecified type   4. Prolapse of anterior vaginal wall   5. Vaginal atrophy   6. Lichen sclerosus    Urine sent for culture today. Treated as a presumptive UTI based on symptoms of pressure and urgency. Macrobid 100mg  x2 daily for 5 days sent to pharmacy.  Patient is soon to go on a cruise in [place] and was  concerned about what to do if she has UTI symptoms. She does not typically have UTI's but this is her second one in the last year. Refill given on Macrobid to take with her for her trip.  Urine sent for micro.  Patient has stage I/IV anterior vaginal prolapse. She is not symptomatic but can feel it when she uses her estrogen cream. She brought with her some vaginal weights today which she practiced using with my assistance and reported feeling empowered to do this at home.  Patient to continue vaginal estrogen cream. We discussed using lubrication with a silicon base or blend when using the weights.  Patient has clobetasol to use for her Lichens and will continue to use as needed.   Patient to follow up in 6 months with Dr. Florian Buff or sooner if needed.    Selmer Dominion, NP

## 2024-02-15 LAB — URINE CULTURE: Culture: <10000 — AB

## 2024-02-18 DIAGNOSIS — D3131 Benign neoplasm of right choroid: Secondary | ICD-10-CM | POA: Diagnosis not present

## 2024-02-18 DIAGNOSIS — H2513 Age-related nuclear cataract, bilateral: Secondary | ICD-10-CM | POA: Diagnosis not present

## 2024-02-18 DIAGNOSIS — H43811 Vitreous degeneration, right eye: Secondary | ICD-10-CM | POA: Diagnosis not present

## 2024-02-18 DIAGNOSIS — H43813 Vitreous degeneration, bilateral: Secondary | ICD-10-CM | POA: Diagnosis not present

## 2024-02-18 DIAGNOSIS — H43812 Vitreous degeneration, left eye: Secondary | ICD-10-CM | POA: Diagnosis not present

## 2024-02-18 DIAGNOSIS — H33321 Round hole, right eye: Secondary | ICD-10-CM | POA: Diagnosis not present

## 2024-02-19 ENCOUNTER — Encounter: Payer: Self-pay | Admitting: Obstetrics and Gynecology

## 2024-03-12 DIAGNOSIS — D0511 Intraductal carcinoma in situ of right breast: Secondary | ICD-10-CM | POA: Diagnosis not present

## 2024-03-14 DIAGNOSIS — M79662 Pain in left lower leg: Secondary | ICD-10-CM | POA: Diagnosis not present

## 2024-03-14 DIAGNOSIS — M79605 Pain in left leg: Secondary | ICD-10-CM | POA: Diagnosis not present

## 2024-03-20 DIAGNOSIS — M79652 Pain in left thigh: Secondary | ICD-10-CM | POA: Diagnosis not present

## 2024-03-31 DIAGNOSIS — M79652 Pain in left thigh: Secondary | ICD-10-CM | POA: Diagnosis not present

## 2024-04-08 DIAGNOSIS — D225 Melanocytic nevi of trunk: Secondary | ICD-10-CM | POA: Diagnosis not present

## 2024-04-08 DIAGNOSIS — L900 Lichen sclerosus et atrophicus: Secondary | ICD-10-CM | POA: Diagnosis not present

## 2024-04-08 DIAGNOSIS — L821 Other seborrheic keratosis: Secondary | ICD-10-CM | POA: Diagnosis not present

## 2024-04-08 DIAGNOSIS — L570 Actinic keratosis: Secondary | ICD-10-CM | POA: Diagnosis not present

## 2024-04-08 DIAGNOSIS — L723 Sebaceous cyst: Secondary | ICD-10-CM | POA: Diagnosis not present

## 2024-04-08 DIAGNOSIS — L814 Other melanin hyperpigmentation: Secondary | ICD-10-CM | POA: Diagnosis not present

## 2024-04-08 DIAGNOSIS — L820 Inflamed seborrheic keratosis: Secondary | ICD-10-CM | POA: Diagnosis not present

## 2024-04-08 DIAGNOSIS — L84 Corns and callosities: Secondary | ICD-10-CM | POA: Diagnosis not present

## 2024-05-07 DIAGNOSIS — M79674 Pain in right toe(s): Secondary | ICD-10-CM | POA: Diagnosis not present

## 2024-05-07 DIAGNOSIS — S92514A Nondisplaced fracture of proximal phalanx of right lesser toe(s), initial encounter for closed fracture: Secondary | ICD-10-CM | POA: Diagnosis not present

## 2024-05-12 DIAGNOSIS — I10 Essential (primary) hypertension: Secondary | ICD-10-CM | POA: Diagnosis not present

## 2024-05-12 DIAGNOSIS — C50519 Malignant neoplasm of lower-outer quadrant of unspecified female breast: Secondary | ICD-10-CM | POA: Diagnosis not present

## 2024-05-29 DIAGNOSIS — M79652 Pain in left thigh: Secondary | ICD-10-CM | POA: Diagnosis not present

## 2024-06-25 DIAGNOSIS — M8580 Other specified disorders of bone density and structure, unspecified site: Secondary | ICD-10-CM | POA: Diagnosis not present

## 2024-06-25 DIAGNOSIS — E039 Hypothyroidism, unspecified: Secondary | ICD-10-CM | POA: Diagnosis not present

## 2024-06-25 DIAGNOSIS — I10 Essential (primary) hypertension: Secondary | ICD-10-CM | POA: Diagnosis not present

## 2024-07-03 DIAGNOSIS — I10 Essential (primary) hypertension: Secondary | ICD-10-CM | POA: Diagnosis not present

## 2024-07-03 DIAGNOSIS — J309 Allergic rhinitis, unspecified: Secondary | ICD-10-CM | POA: Diagnosis not present

## 2024-07-03 DIAGNOSIS — Z1339 Encounter for screening examination for other mental health and behavioral disorders: Secondary | ICD-10-CM | POA: Diagnosis not present

## 2024-07-03 DIAGNOSIS — E785 Hyperlipidemia, unspecified: Secondary | ICD-10-CM | POA: Diagnosis not present

## 2024-07-03 DIAGNOSIS — D0511 Intraductal carcinoma in situ of right breast: Secondary | ICD-10-CM | POA: Diagnosis not present

## 2024-07-03 DIAGNOSIS — D649 Anemia, unspecified: Secondary | ICD-10-CM | POA: Diagnosis not present

## 2024-07-03 DIAGNOSIS — Z1331 Encounter for screening for depression: Secondary | ICD-10-CM | POA: Diagnosis not present

## 2024-07-03 DIAGNOSIS — M8580 Other specified disorders of bone density and structure, unspecified site: Secondary | ICD-10-CM | POA: Diagnosis not present

## 2024-07-03 DIAGNOSIS — R82998 Other abnormal findings in urine: Secondary | ICD-10-CM | POA: Diagnosis not present

## 2024-07-03 DIAGNOSIS — Z0000 Encounter for general adult medical examination without abnormal findings: Secondary | ICD-10-CM | POA: Diagnosis not present

## 2024-07-03 DIAGNOSIS — E669 Obesity, unspecified: Secondary | ICD-10-CM | POA: Diagnosis not present

## 2024-07-03 DIAGNOSIS — E039 Hypothyroidism, unspecified: Secondary | ICD-10-CM | POA: Diagnosis not present

## 2024-07-25 DIAGNOSIS — J309 Allergic rhinitis, unspecified: Secondary | ICD-10-CM | POA: Diagnosis not present

## 2024-07-25 DIAGNOSIS — I10 Essential (primary) hypertension: Secondary | ICD-10-CM | POA: Diagnosis not present

## 2024-07-25 DIAGNOSIS — J069 Acute upper respiratory infection, unspecified: Secondary | ICD-10-CM | POA: Diagnosis not present

## 2024-07-25 DIAGNOSIS — R051 Acute cough: Secondary | ICD-10-CM | POA: Diagnosis not present

## 2024-08-12 ENCOUNTER — Other Ambulatory Visit: Payer: Self-pay | Admitting: Internal Medicine

## 2024-08-12 DIAGNOSIS — Z1231 Encounter for screening mammogram for malignant neoplasm of breast: Secondary | ICD-10-CM

## 2024-08-13 ENCOUNTER — Ambulatory Visit: Payer: Medicare PPO | Admitting: Obstetrics and Gynecology

## 2024-08-13 ENCOUNTER — Encounter: Payer: Self-pay | Admitting: Obstetrics and Gynecology

## 2024-08-13 VITALS — BP 125/85 | HR 76

## 2024-08-13 DIAGNOSIS — N952 Postmenopausal atrophic vaginitis: Secondary | ICD-10-CM | POA: Diagnosis not present

## 2024-08-13 DIAGNOSIS — N8110 Cystocele, unspecified: Secondary | ICD-10-CM | POA: Diagnosis not present

## 2024-08-13 DIAGNOSIS — N3281 Overactive bladder: Secondary | ICD-10-CM | POA: Diagnosis not present

## 2024-08-13 MED ORDER — ESTRADIOL 0.1 MG/GM VA CREA: TOPICAL_CREAM | VAGINAL | 11 refills | Status: AC

## 2024-08-13 NOTE — Progress Notes (Signed)
 [facility] Return Visit  SUBJECTIVE  History of Present Illness: Victoria Wallace is a 73 y.o. female seen in follow-up. She is s/p Robotic assisted lysis of adhesions, total laparoscopic hysterectomy, bilateral salpingo-oophorectomy, sacrocolpopexy Lucita Lite Y), midurethral sling (Advantage Fit), cystoscopy on 03/23/23   She had two boughts of bronchitis, but has not had had leakage. She she does have urgency throughout the day. Occasionally she will leak (larger amounts) with urgency Sometimes she will go weeks without it happening at all. Then can have several episodes a day. Has realized that coffee/ caffeine is a trigger and only drinks 0.5 cup per day. She has not tried the trospium  previously prescribed because she was concerned about constipation. She has done pelvic PT about a year ago but has not kept up with the exercises.   Bowel movements are sometimes pencil-like. Has bowel movements every day.  Has been continuing the miralax  daily, occasional stool softener. Tries to avoid any straining.   Feels some kind of bulge when she inserts the the estrogen cream. Does not seem to be coming to the opening. Has noticed occasional tinge of blood.   Past Medical History: Patient  has a past medical history of Abnormal dexamethasone  suppression test (06/2014), Arthritis, Asymptomatic microscopic hematuria, Chronic constipation, Ductal carcinoma in situ (DCIS) of right breast, Encounter for hepatitis C screening test for low risk patient (05/18/2017), Flu vaccine (08/13/2017), History of colonoscopy (2012), Hypertension, Hypothyroidism, Ovarian cyst (2024), shingles vaccine (2011), Tetanus toxoid vaccination administered within the past year (05/18/2017), Uterine prolapse, and Wears glasses.   Past Surgical History: She  has a past surgical history that includes Lumbar laminectomy (Bilateral, 2002); Mouth surgery; Eye surgery; Total knee arthroplasty (Right, 08/28/2017); Breast  biopsy (Right); Total mastectomy (Right, 10/20/2021); Endometrial biopsy (01/01/2023); Xi robotic assisted total hysterectomy with sacrocolpopexy (N/A, 03/23/2023); Bladder suspension (N/A, 03/23/2023); and Cystoscopy (N/A, 03/23/2023).   Medications: She has a current medication list which includes the following prescription(s): cholecalciferol, clobetasol  ointment, cyanocobalamin , hydrochlorothiazide , levothyroxine , lisinopril , magnesium , metoprolol  succinate, multivitamin with minerals, polyethylene glycol powder, tamoxifen , and estradiol .   Allergies: Patient is allergic to codeine, penicillins, and tape.   Social History: Patient  reports that she has never smoked. She has never used smokeless tobacco. She reports current alcohol use of about 1.0 standard drink of alcohol per week. She reports that she does not use drugs.     OBJECTIVE     Physical Exam: Vitals:   08/13/24 1013  BP: 125/85  Pulse: 76   Gen: No apparent distress, A&O x 3.  Detailed Urogynecologic Evaluation:  On speculum, small area of granulation tissue on posterior apex, treated with silver nitrate. No visible or palpable mesh. With split speculum on valsalva, there is a small area distally that protrudes on the anterior vaginal wall to -1, with the remainder of the vaginal wall well supported.  No bulge with valsalva on the posterior wall.    ASSESSMENT AND PLAN    Ms. Latka is a 73 y.o. with:  1. Overactive bladder   2. Prolapse of anterior vaginal wall   3. Vaginal atrophy     Overactive bladder Assessment & Plan: - We discussed trying an alternative medication but if she is not having symptoms every day, then may not be worth trying. Advised her to avoid bladder irritants/ triggers.  - She will work on incorporating pelvic PT exercises- she has kept notes from her prior sessions.    Prolapse of anterior vaginal wall Assessment & Plan: -  Small amount of distal prolapse with remainder of wall well  supported with mesh. Overall she is not bothered by this, was just more curious about what she was feeling.     Vaginal atrophy -     Estradiol ; Place 0.5g nightly twice a week  Dispense: 30 g; Refill: 11  Follow up 1 year or sooner if needed   Victoria LOISE Caper, MD  Time spent: I spent 30 minutes dedicated to the care of this patient on the date of this encounter to include pre-visit review of records, face-to-face time with the patient and post visit documentation.

## 2024-08-13 NOTE — Assessment & Plan Note (Signed)
-   We discussed trying an alternative medication but if she is not having symptoms every day, then may not be worth trying. Advised her to avoid bladder irritants/ triggers.  - She will work on incorporating pelvic PT exercises- she has kept notes from her prior sessions.

## 2024-08-13 NOTE — Assessment & Plan Note (Signed)
-   Small amount of distal prolapse with remainder of wall well supported with mesh. Overall she is not bothered by this, was just more curious about what she was feeling.

## 2024-08-28 ENCOUNTER — Inpatient Hospital Stay: Payer: Medicare PPO | Attending: Hematology and Oncology | Admitting: Hematology and Oncology

## 2024-08-28 VITALS — BP 140/80 | HR 80 | Temp 97.8°F | Resp 18 | Ht 70.0 in | Wt 218.0 lb

## 2024-08-28 DIAGNOSIS — N6489 Other specified disorders of breast: Secondary | ICD-10-CM | POA: Insufficient documentation

## 2024-08-28 DIAGNOSIS — Z79899 Other long term (current) drug therapy: Secondary | ICD-10-CM | POA: Diagnosis not present

## 2024-08-28 DIAGNOSIS — Z91048 Other nonmedicinal substance allergy status: Secondary | ICD-10-CM | POA: Diagnosis not present

## 2024-08-28 DIAGNOSIS — Z79810 Long term (current) use of selective estrogen receptor modulators (SERMs): Secondary | ICD-10-CM | POA: Insufficient documentation

## 2024-08-28 DIAGNOSIS — D0511 Intraductal carcinoma in situ of right breast: Secondary | ICD-10-CM | POA: Diagnosis not present

## 2024-08-28 DIAGNOSIS — Z880 Allergy status to penicillin: Secondary | ICD-10-CM | POA: Diagnosis not present

## 2024-08-28 DIAGNOSIS — Z885 Allergy status to narcotic agent status: Secondary | ICD-10-CM | POA: Insufficient documentation

## 2024-08-28 DIAGNOSIS — D649 Anemia, unspecified: Secondary | ICD-10-CM | POA: Insufficient documentation

## 2024-08-28 DIAGNOSIS — Z90710 Acquired absence of both cervix and uterus: Secondary | ICD-10-CM | POA: Insufficient documentation

## 2024-08-28 DIAGNOSIS — Z9011 Acquired absence of right breast and nipple: Secondary | ICD-10-CM | POA: Diagnosis not present

## 2024-08-28 MED ORDER — TAMOXIFEN CITRATE 20 MG PO TABS: 20.0000 mg | ORAL_TABLET | Freq: Every day | ORAL | 3 refills | Status: AC

## 2024-08-28 NOTE — Assessment & Plan Note (Signed)
 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 She underwent total abdominal hysterectomy April 2024 for endometrial hypertrophy.   Breast cancer surveillance: 1.  Breast exam 08/28/2024 benign 2. left breast mammogram scheduled for 09/24/2024   Return to clinic in 1 year for follow-up

## 2024-08-28 NOTE — Progress Notes (Signed)
 Patient Care Team: Stephane Leita DEL, MD as PCP - General (Internal Medicine) Odean Potts, MD as Consulting Physician (Hematology and Oncology) Ebbie Cough, MD as Consulting Physician (General Surgery)  DIAGNOSIS:  Encounter Diagnosis  Name Primary?   Ductal carcinoma in situ (DCIS) of right breast Yes    SUMMARY OF ONCOLOGIC HISTORY: Oncology History  Ductal carcinoma in situ (DCIS) of right breast  09/15/2019 Initial Diagnosis   Right breast biopsy upper outer quadrant: DCIS with necrosis high-grade, upper outer posterior: DCIS involving a complex sclerosing lesion, ER 100%, PR 60 to 80%   09/14/2021 Cancer Staging   Staging form: Breast, AJCC 8th Edition - Clinical stage from 09/14/2021: Stage 0 (cTis (DCIS), cN0, cM0, ER+, PR+) - Signed by Crawford Morna Pickle, NP on 02/18/2022 Stage prefix: Initial diagnosis Nuclear grade: G3   10/20/2021 Surgery   Right mastectomy: High-grade DCIS 2.2 cm, margins negative, ER 100%, PR 60-80%   10/20/2021 Cancer Staging   Staging form: Breast, AJCC 8th Edition - Pathologic stage from 10/20/2021: Stage 0 (pTis (DCIS), pN0, cM0, ER+, PR+) - Signed by Crawford Morna Pickle, NP on 02/18/2022 Stage prefix: Initial diagnosis Nuclear grade: G3   11/2021 -  Anti-estrogen oral therapy   Tamoxifen  daily     CHIEF COMPLIANT: Follow-up on tamoxifen  therapy  HISTORY OF PRESENT ILLNESS:  History of Present Illness Victoria Wallace is a 73 year old female with breast cancer who presents for a routine follow-up.  She has been on tamoxifen  for three years without significant side effects. She has two more years remaining on this treatment. She desires more energy and has a slightly low hemoglobin level, noted last year. She received a B12 shot, but her hemoglobin remains just below the normal range.     ALLERGIES:  is allergic to codeine, penicillins, and tape.  MEDICATIONS:  Current Outpatient Medications  Medication Sig Dispense Refill    cholecalciferol (VITAMIN D3) 25 MCG (1000 UNIT) tablet Take 2,000 Units by mouth daily.     clobetasol  ointment (TEMOVATE ) 0.05 % APPLY THIN LAYER TOPICALLY TO THE AFFECTED AREA TWICE DAILY     cyanocobalamin  (VITAMIN B12) 250 MCG tablet Take 1 tablet (250 mcg total) by mouth daily.     estradiol  (ESTRACE ) 0.1 MG/GM vaginal cream Place 0.5g nightly twice a week 30 g 11   hydrochlorothiazide  (HYDRODIURIL ) 25 MG tablet Take 25 mg by mouth daily.     levothyroxine  (SYNTHROID ) 25 MCG tablet Take 1 tablet (25 mcg total) by mouth daily before breakfast.     lisinopril  (PRINIVIL ,ZESTRIL ) 40 MG tablet Take 40 mg by mouth daily.     metoprolol  succinate (TOPROL -XL) 100 MG 24 hr tablet Take 100 mg by mouth daily.  0   Multiple Vitamin (MULTIVITAMIN WITH MINERALS) TABS tablet Take 1 tablet by mouth daily. Pt takes three times a week.     polyethylene glycol powder (GLYCOLAX /MIRALAX ) 17 GM/SCOOP powder Take 17 g by mouth daily. Drink 17g (1 scoop) dissolved in water  per day. 255 g 0   tamoxifen  (NOLVADEX ) 20 MG tablet Take 1 tablet (20 mg total) by mouth daily. 90 tablet 3   No current facility-administered medications for this visit.    PHYSICAL EXAMINATION: ECOG PERFORMANCE STATUS: 1 - Symptomatic but completely ambulatory  Vitals:   08/28/24 1103  BP: (!) 140/80  Pulse: 80  Resp: 18  Temp: 97.8 F (36.6 C)  SpO2: 100%   Filed Weights   08/28/24 1103  Weight: 218 lb (98.9 kg)  Physical Exam   (exam performed in the presence of a chaperone)  LABORATORY DATA:  I have reviewed the data as listed    Latest Ref Rng & Units 03/21/2023   10:17 AM 10/12/2021    5:55 PM 08/29/2017    5:31 AM  CMP  Glucose 70 - 99 mg/dL 899  884  894   BUN 8 - 23 mg/dL 20  20  14    Creatinine 0.44 - 1.00 mg/dL 9.37  9.30  9.29   Sodium 135 - 145 mmol/L 138  136  138   Potassium 3.5 - 5.1 mmol/L 3.9  4.0  3.6   Chloride 98 - 111 mmol/L 105  99  106   CO2 22 - 32 mmol/L 27  28  27    Calcium 8.9 - 10.3  mg/dL 9.2  89.8  8.7     Lab Results  Component Value Date   WBC 5.0 03/21/2023   HGB 11.3 (L) 03/21/2023   HCT 35.0 (L) 03/21/2023   MCV 98.9 03/21/2023   PLT 204 03/21/2023    ASSESSMENT & PLAN:  Ductal carcinoma in situ (DCIS) of right breast 10/20/2021 right mastectomy: High-grade DCIS 2.2 cm, margins negative, ER 100%, PR 60-80% Current treatment: Tamoxifen  20 mg daily x5 years started December 2022   Tamoxifen  toxicities: she is tolerating it extremely well without any problems or concerns. She underwent total abdominal hysterectomy April 2024 for endometrial hypertrophy.   Breast cancer surveillance: 1.  Breast exam 08/28/2024 benign 2. left breast mammogram scheduled for 09/24/2024   Return to clinic in 1 year for follow-up ------------------------------------- Assessment and Plan Assessment & Plan Ductal carcinoma in situ of right breast, status post treatment, on tamoxifen  Three years post-DCIS diagnosis, on tamoxifen  without significant side effects. - Renew tamoxifen  prescription for one year and send to [facility]. - Mammogram scheduled for next month.  Mild anemia Hemoglobin at 11.6, slightly below normal.   - Monitor hemoglobin levels.      No orders of the defined types were placed in this encounter.  The patient has a good understanding of the overall plan. she agrees with it. she will call with any problems that may develop before the next visit here. Total time spent: 30 mins including face to face time and time spent for planning, charting and co-ordination of care   Naomi MARLA Chad, MD 08/28/24

## 2024-09-18 DIAGNOSIS — M8589 Other specified disorders of bone density and structure, multiple sites: Secondary | ICD-10-CM | POA: Diagnosis not present

## 2024-09-18 DIAGNOSIS — E039 Hypothyroidism, unspecified: Secondary | ICD-10-CM | POA: Diagnosis not present

## 2024-09-24 ENCOUNTER — Ambulatory Visit
Admission: RE | Admit: 2024-09-24 | Discharge: 2024-09-24 | Disposition: A | Source: Ambulatory Visit | Attending: Internal Medicine

## 2024-09-24 DIAGNOSIS — Z1231 Encounter for screening mammogram for malignant neoplasm of breast: Secondary | ICD-10-CM

## 2024-10-14 DIAGNOSIS — L720 Epidermal cyst: Secondary | ICD-10-CM | POA: Diagnosis not present

## 2024-10-14 DIAGNOSIS — L820 Inflamed seborrheic keratosis: Secondary | ICD-10-CM | POA: Diagnosis not present

## 2024-10-14 DIAGNOSIS — L723 Sebaceous cyst: Secondary | ICD-10-CM | POA: Diagnosis not present

## 2024-11-19 DIAGNOSIS — H524 Presbyopia: Secondary | ICD-10-CM | POA: Diagnosis not present

## 2024-11-19 DIAGNOSIS — H2513 Age-related nuclear cataract, bilateral: Secondary | ICD-10-CM | POA: Diagnosis not present

## 2024-11-19 DIAGNOSIS — H52203 Unspecified astigmatism, bilateral: Secondary | ICD-10-CM | POA: Diagnosis not present

## 2024-11-19 DIAGNOSIS — H40013 Open angle with borderline findings, low risk, bilateral: Secondary | ICD-10-CM | POA: Diagnosis not present

## 2024-11-19 DIAGNOSIS — D3131 Benign neoplasm of right choroid: Secondary | ICD-10-CM | POA: Diagnosis not present

## 2024-12-29 ENCOUNTER — Encounter: Payer: Self-pay | Admitting: *Deleted

## 2025-08-31 ENCOUNTER — Ambulatory Visit: Admitting: Hematology and Oncology
